# Patient Record
Sex: Male | Born: 1935 | Race: White | Hispanic: No | Marital: Single | Smoking: Former smoker
Health system: Southern US, Community
[De-identification: ages and names within clinical notes are randomized; demographics above are authoritative.]

## PROBLEM LIST (undated history)

## (undated) DIAGNOSIS — E785 Hyperlipidemia, unspecified: Secondary | ICD-10-CM

## (undated) DIAGNOSIS — I639 Cerebral infarction, unspecified: Secondary | ICD-10-CM

## (undated) DIAGNOSIS — C61 Malignant neoplasm of prostate: Secondary | ICD-10-CM

## (undated) DIAGNOSIS — N390 Urinary tract infection, site not specified: Secondary | ICD-10-CM

## (undated) DIAGNOSIS — I10 Essential (primary) hypertension: Secondary | ICD-10-CM

## (undated) DIAGNOSIS — B1910 Unspecified viral hepatitis B without hepatic coma: Secondary | ICD-10-CM

## (undated) DIAGNOSIS — H9190 Unspecified hearing loss, unspecified ear: Secondary | ICD-10-CM

## (undated) HISTORY — DX: Essential (primary) hypertension: I10

## (undated) HISTORY — DX: Hyperlipidemia, unspecified: E78.5

## (undated) HISTORY — PX: PROSTATE SURGERY: SHX751

## (undated) HISTORY — PX: BLADDER STONE REMOVAL: SHX568

## (undated) HISTORY — DX: Unspecified viral hepatitis B without hepatic coma: B19.10

## (undated) HISTORY — DX: Malignant neoplasm of prostate: C61

---

## 2001-08-23 ENCOUNTER — Inpatient Hospital Stay (HOSPITAL_COMMUNITY): Admission: EM | Admit: 2001-08-23 | Discharge: 2001-08-30 | Payer: Self-pay

## 2001-08-24 ENCOUNTER — Encounter: Payer: Self-pay | Admitting: Internal Medicine

## 2002-08-17 HISTORY — PX: CATARACT EXTRACTION W/ INTRAOCULAR LENS  IMPLANT, BILATERAL: SHX1307

## 2002-11-01 ENCOUNTER — Emergency Department (HOSPITAL_COMMUNITY): Admission: EM | Admit: 2002-11-01 | Discharge: 2002-11-01 | Payer: Self-pay | Admitting: Emergency Medicine

## 2004-04-28 ENCOUNTER — Ambulatory Visit (HOSPITAL_COMMUNITY): Admission: RE | Admit: 2004-04-28 | Discharge: 2004-04-28 | Payer: Self-pay | Admitting: Urology

## 2004-05-09 ENCOUNTER — Ambulatory Visit: Admission: RE | Admit: 2004-05-09 | Discharge: 2004-06-24 | Payer: Self-pay | Admitting: Radiation Oncology

## 2004-05-20 ENCOUNTER — Ambulatory Visit (HOSPITAL_COMMUNITY): Admission: RE | Admit: 2004-05-20 | Discharge: 2004-05-20 | Payer: Self-pay | Admitting: Radiation Oncology

## 2004-07-06 ENCOUNTER — Emergency Department (HOSPITAL_COMMUNITY): Admission: EM | Admit: 2004-07-06 | Discharge: 2004-07-06 | Payer: Self-pay | Admitting: Emergency Medicine

## 2005-01-24 ENCOUNTER — Encounter: Admission: RE | Admit: 2005-01-24 | Discharge: 2005-01-24 | Payer: Self-pay | Admitting: Internal Medicine

## 2011-10-20 DIAGNOSIS — C61 Malignant neoplasm of prostate: Secondary | ICD-10-CM | POA: Diagnosis not present

## 2011-12-07 DIAGNOSIS — E785 Hyperlipidemia, unspecified: Secondary | ICD-10-CM | POA: Diagnosis not present

## 2011-12-07 DIAGNOSIS — I10 Essential (primary) hypertension: Secondary | ICD-10-CM | POA: Diagnosis not present

## 2011-12-14 DIAGNOSIS — R82998 Other abnormal findings in urine: Secondary | ICD-10-CM | POA: Diagnosis not present

## 2011-12-14 DIAGNOSIS — E785 Hyperlipidemia, unspecified: Secondary | ICD-10-CM | POA: Diagnosis not present

## 2011-12-14 DIAGNOSIS — I10 Essential (primary) hypertension: Secondary | ICD-10-CM | POA: Diagnosis not present

## 2011-12-14 DIAGNOSIS — C61 Malignant neoplasm of prostate: Secondary | ICD-10-CM | POA: Diagnosis not present

## 2012-02-16 DIAGNOSIS — C3490 Malignant neoplasm of unspecified part of unspecified bronchus or lung: Secondary | ICD-10-CM | POA: Diagnosis not present

## 2012-02-16 DIAGNOSIS — C7800 Secondary malignant neoplasm of unspecified lung: Secondary | ICD-10-CM | POA: Diagnosis not present

## 2012-02-16 DIAGNOSIS — C781 Secondary malignant neoplasm of mediastinum: Secondary | ICD-10-CM | POA: Diagnosis not present

## 2012-05-24 DIAGNOSIS — R310 Gross hematuria: Secondary | ICD-10-CM | POA: Diagnosis not present

## 2012-05-24 DIAGNOSIS — C61 Malignant neoplasm of prostate: Secondary | ICD-10-CM | POA: Diagnosis not present

## 2012-05-24 DIAGNOSIS — N400 Enlarged prostate without lower urinary tract symptoms: Secondary | ICD-10-CM | POA: Diagnosis not present

## 2012-06-03 DIAGNOSIS — N210 Calculus in bladder: Secondary | ICD-10-CM | POA: Diagnosis not present

## 2012-06-03 DIAGNOSIS — R310 Gross hematuria: Secondary | ICD-10-CM | POA: Diagnosis not present

## 2012-06-03 DIAGNOSIS — C61 Malignant neoplasm of prostate: Secondary | ICD-10-CM | POA: Diagnosis not present

## 2012-06-16 DIAGNOSIS — N400 Enlarged prostate without lower urinary tract symptoms: Secondary | ICD-10-CM | POA: Diagnosis not present

## 2012-06-16 DIAGNOSIS — C61 Malignant neoplasm of prostate: Secondary | ICD-10-CM | POA: Diagnosis not present

## 2012-06-16 DIAGNOSIS — R310 Gross hematuria: Secondary | ICD-10-CM | POA: Diagnosis not present

## 2012-07-13 DIAGNOSIS — Z23 Encounter for immunization: Secondary | ICD-10-CM | POA: Diagnosis not present

## 2012-09-13 DIAGNOSIS — N138 Other obstructive and reflux uropathy: Secondary | ICD-10-CM | POA: Diagnosis not present

## 2012-09-13 DIAGNOSIS — E7800 Pure hypercholesterolemia, unspecified: Secondary | ICD-10-CM | POA: Diagnosis not present

## 2012-09-13 DIAGNOSIS — N400 Enlarged prostate without lower urinary tract symptoms: Secondary | ICD-10-CM | POA: Diagnosis not present

## 2012-09-13 DIAGNOSIS — N401 Enlarged prostate with lower urinary tract symptoms: Secondary | ICD-10-CM | POA: Diagnosis not present

## 2012-09-13 DIAGNOSIS — I10 Essential (primary) hypertension: Secondary | ICD-10-CM | POA: Diagnosis not present

## 2012-09-13 DIAGNOSIS — Z8546 Personal history of malignant neoplasm of prostate: Secondary | ICD-10-CM | POA: Diagnosis not present

## 2012-09-13 DIAGNOSIS — C61 Malignant neoplasm of prostate: Secondary | ICD-10-CM | POA: Diagnosis not present

## 2012-09-13 DIAGNOSIS — N210 Calculus in bladder: Secondary | ICD-10-CM | POA: Diagnosis not present

## 2012-10-04 DIAGNOSIS — I4949 Other premature depolarization: Secondary | ICD-10-CM | POA: Diagnosis not present

## 2012-10-04 DIAGNOSIS — I10 Essential (primary) hypertension: Secondary | ICD-10-CM | POA: Diagnosis not present

## 2012-10-04 DIAGNOSIS — N138 Other obstructive and reflux uropathy: Secondary | ICD-10-CM | POA: Diagnosis not present

## 2012-10-04 DIAGNOSIS — E7800 Pure hypercholesterolemia, unspecified: Secondary | ICD-10-CM | POA: Diagnosis not present

## 2012-10-04 DIAGNOSIS — N210 Calculus in bladder: Secondary | ICD-10-CM | POA: Diagnosis not present

## 2012-10-04 DIAGNOSIS — C61 Malignant neoplasm of prostate: Secondary | ICD-10-CM | POA: Diagnosis not present

## 2012-10-04 DIAGNOSIS — Z01810 Encounter for preprocedural cardiovascular examination: Secondary | ICD-10-CM | POA: Diagnosis not present

## 2012-10-04 DIAGNOSIS — N401 Enlarged prostate with lower urinary tract symptoms: Secondary | ICD-10-CM | POA: Diagnosis not present

## 2012-10-04 DIAGNOSIS — N320 Bladder-neck obstruction: Secondary | ICD-10-CM | POA: Diagnosis not present

## 2012-10-04 DIAGNOSIS — Z79899 Other long term (current) drug therapy: Secondary | ICD-10-CM | POA: Diagnosis not present

## 2012-10-04 DIAGNOSIS — Z01818 Encounter for other preprocedural examination: Secondary | ICD-10-CM | POA: Diagnosis not present

## 2012-10-04 DIAGNOSIS — Z7982 Long term (current) use of aspirin: Secondary | ICD-10-CM | POA: Diagnosis not present

## 2012-10-26 DIAGNOSIS — R319 Hematuria, unspecified: Secondary | ICD-10-CM | POA: Diagnosis not present

## 2012-10-26 DIAGNOSIS — N400 Enlarged prostate without lower urinary tract symptoms: Secondary | ICD-10-CM | POA: Diagnosis not present

## 2012-10-26 DIAGNOSIS — N210 Calculus in bladder: Secondary | ICD-10-CM | POA: Diagnosis not present

## 2012-10-26 DIAGNOSIS — Z79899 Other long term (current) drug therapy: Secondary | ICD-10-CM | POA: Diagnosis not present

## 2012-10-26 DIAGNOSIS — I10 Essential (primary) hypertension: Secondary | ICD-10-CM | POA: Diagnosis not present

## 2012-10-26 DIAGNOSIS — C61 Malignant neoplasm of prostate: Secondary | ICD-10-CM | POA: Diagnosis not present

## 2012-10-26 DIAGNOSIS — E785 Hyperlipidemia, unspecified: Secondary | ICD-10-CM | POA: Diagnosis not present

## 2012-10-26 DIAGNOSIS — N401 Enlarged prostate with lower urinary tract symptoms: Secondary | ICD-10-CM | POA: Diagnosis not present

## 2012-10-26 DIAGNOSIS — N209 Urinary calculus, unspecified: Secondary | ICD-10-CM | POA: Diagnosis not present

## 2012-10-27 DIAGNOSIS — N400 Enlarged prostate without lower urinary tract symptoms: Secondary | ICD-10-CM | POA: Diagnosis not present

## 2012-10-27 DIAGNOSIS — I10 Essential (primary) hypertension: Secondary | ICD-10-CM | POA: Diagnosis not present

## 2012-10-27 DIAGNOSIS — N401 Enlarged prostate with lower urinary tract symptoms: Secondary | ICD-10-CM | POA: Diagnosis not present

## 2012-10-27 DIAGNOSIS — N210 Calculus in bladder: Secondary | ICD-10-CM | POA: Diagnosis not present

## 2012-10-27 DIAGNOSIS — Z79899 Other long term (current) drug therapy: Secondary | ICD-10-CM | POA: Diagnosis not present

## 2012-10-27 DIAGNOSIS — C61 Malignant neoplasm of prostate: Secondary | ICD-10-CM | POA: Diagnosis not present

## 2012-10-27 DIAGNOSIS — E785 Hyperlipidemia, unspecified: Secondary | ICD-10-CM | POA: Diagnosis not present

## 2012-11-03 DIAGNOSIS — N400 Enlarged prostate without lower urinary tract symptoms: Secondary | ICD-10-CM | POA: Diagnosis not present

## 2012-11-23 DIAGNOSIS — N400 Enlarged prostate without lower urinary tract symptoms: Secondary | ICD-10-CM | POA: Diagnosis not present

## 2012-11-29 DIAGNOSIS — N209 Urinary calculus, unspecified: Secondary | ICD-10-CM | POA: Insufficient documentation

## 2012-11-29 DIAGNOSIS — N320 Bladder-neck obstruction: Secondary | ICD-10-CM | POA: Insufficient documentation

## 2012-11-29 DIAGNOSIS — C61 Malignant neoplasm of prostate: Secondary | ICD-10-CM | POA: Insufficient documentation

## 2012-11-30 DIAGNOSIS — R310 Gross hematuria: Secondary | ICD-10-CM | POA: Diagnosis not present

## 2012-11-30 DIAGNOSIS — R32 Unspecified urinary incontinence: Secondary | ICD-10-CM | POA: Diagnosis not present

## 2012-11-30 DIAGNOSIS — N320 Bladder-neck obstruction: Secondary | ICD-10-CM | POA: Diagnosis not present

## 2012-11-30 DIAGNOSIS — Z7982 Long term (current) use of aspirin: Secondary | ICD-10-CM | POA: Diagnosis not present

## 2012-11-30 DIAGNOSIS — Z87442 Personal history of urinary calculi: Secondary | ICD-10-CM | POA: Diagnosis not present

## 2012-11-30 DIAGNOSIS — N401 Enlarged prostate with lower urinary tract symptoms: Secondary | ICD-10-CM | POA: Diagnosis not present

## 2012-11-30 DIAGNOSIS — C61 Malignant neoplasm of prostate: Secondary | ICD-10-CM | POA: Diagnosis not present

## 2012-11-30 DIAGNOSIS — R351 Nocturia: Secondary | ICD-10-CM | POA: Diagnosis not present

## 2012-11-30 DIAGNOSIS — I10 Essential (primary) hypertension: Secondary | ICD-10-CM | POA: Diagnosis not present

## 2012-11-30 DIAGNOSIS — Z882 Allergy status to sulfonamides status: Secondary | ICD-10-CM | POA: Diagnosis not present

## 2012-11-30 DIAGNOSIS — E7800 Pure hypercholesterolemia, unspecified: Secondary | ICD-10-CM | POA: Diagnosis not present

## 2012-11-30 DIAGNOSIS — N210 Calculus in bladder: Secondary | ICD-10-CM | POA: Diagnosis not present

## 2012-11-30 DIAGNOSIS — R3915 Urgency of urination: Secondary | ICD-10-CM | POA: Diagnosis not present

## 2012-12-01 DIAGNOSIS — N401 Enlarged prostate with lower urinary tract symptoms: Secondary | ICD-10-CM | POA: Diagnosis not present

## 2012-12-01 DIAGNOSIS — R3915 Urgency of urination: Secondary | ICD-10-CM | POA: Diagnosis not present

## 2012-12-01 DIAGNOSIS — R351 Nocturia: Secondary | ICD-10-CM | POA: Diagnosis not present

## 2012-12-01 DIAGNOSIS — C61 Malignant neoplasm of prostate: Secondary | ICD-10-CM | POA: Diagnosis not present

## 2012-12-01 DIAGNOSIS — N320 Bladder-neck obstruction: Secondary | ICD-10-CM | POA: Diagnosis not present

## 2012-12-01 DIAGNOSIS — R32 Unspecified urinary incontinence: Secondary | ICD-10-CM | POA: Diagnosis not present

## 2012-12-02 DIAGNOSIS — N400 Enlarged prostate without lower urinary tract symptoms: Secondary | ICD-10-CM | POA: Diagnosis not present

## 2012-12-08 DIAGNOSIS — N400 Enlarged prostate without lower urinary tract symptoms: Secondary | ICD-10-CM | POA: Diagnosis not present

## 2012-12-12 DIAGNOSIS — N400 Enlarged prostate without lower urinary tract symptoms: Secondary | ICD-10-CM | POA: Diagnosis not present

## 2013-01-10 DIAGNOSIS — R351 Nocturia: Secondary | ICD-10-CM | POA: Diagnosis not present

## 2013-01-10 DIAGNOSIS — Z8546 Personal history of malignant neoplasm of prostate: Secondary | ICD-10-CM | POA: Diagnosis not present

## 2013-01-10 DIAGNOSIS — Z87442 Personal history of urinary calculi: Secondary | ICD-10-CM | POA: Diagnosis not present

## 2013-01-10 DIAGNOSIS — R3919 Other difficulties with micturition: Secondary | ICD-10-CM | POA: Diagnosis not present

## 2013-01-10 DIAGNOSIS — R3915 Urgency of urination: Secondary | ICD-10-CM | POA: Diagnosis not present

## 2013-01-10 DIAGNOSIS — Z48816 Encounter for surgical aftercare following surgery on the genitourinary system: Secondary | ICD-10-CM | POA: Diagnosis not present

## 2013-01-10 DIAGNOSIS — R350 Frequency of micturition: Secondary | ICD-10-CM | POA: Diagnosis not present

## 2013-02-02 DIAGNOSIS — C61 Malignant neoplasm of prostate: Secondary | ICD-10-CM | POA: Diagnosis not present

## 2013-02-09 DIAGNOSIS — C61 Malignant neoplasm of prostate: Secondary | ICD-10-CM | POA: Diagnosis not present

## 2013-02-09 DIAGNOSIS — R82998 Other abnormal findings in urine: Secondary | ICD-10-CM | POA: Diagnosis not present

## 2013-02-09 DIAGNOSIS — N400 Enlarged prostate without lower urinary tract symptoms: Secondary | ICD-10-CM | POA: Diagnosis not present

## 2013-05-03 DIAGNOSIS — Z125 Encounter for screening for malignant neoplasm of prostate: Secondary | ICD-10-CM | POA: Diagnosis not present

## 2013-05-03 DIAGNOSIS — E785 Hyperlipidemia, unspecified: Secondary | ICD-10-CM | POA: Diagnosis not present

## 2013-05-03 DIAGNOSIS — I10 Essential (primary) hypertension: Secondary | ICD-10-CM | POA: Diagnosis not present

## 2013-05-03 DIAGNOSIS — R82998 Other abnormal findings in urine: Secondary | ICD-10-CM | POA: Diagnosis not present

## 2013-05-08 DIAGNOSIS — Z0000 Encounter for general adult medical examination without abnormal findings: Secondary | ICD-10-CM | POA: Diagnosis not present

## 2013-05-08 DIAGNOSIS — C61 Malignant neoplasm of prostate: Secondary | ICD-10-CM | POA: Diagnosis not present

## 2013-05-08 DIAGNOSIS — Z1331 Encounter for screening for depression: Secondary | ICD-10-CM | POA: Diagnosis not present

## 2013-05-08 DIAGNOSIS — Z23 Encounter for immunization: Secondary | ICD-10-CM | POA: Diagnosis not present

## 2013-05-08 DIAGNOSIS — E785 Hyperlipidemia, unspecified: Secondary | ICD-10-CM | POA: Diagnosis not present

## 2013-05-08 DIAGNOSIS — I10 Essential (primary) hypertension: Secondary | ICD-10-CM | POA: Diagnosis not present

## 2013-05-10 DIAGNOSIS — Z1212 Encounter for screening for malignant neoplasm of rectum: Secondary | ICD-10-CM | POA: Diagnosis not present

## 2013-06-01 ENCOUNTER — Encounter: Payer: Self-pay | Admitting: Gastroenterology

## 2013-06-15 DIAGNOSIS — R300 Dysuria: Secondary | ICD-10-CM | POA: Diagnosis not present

## 2013-06-15 DIAGNOSIS — R82998 Other abnormal findings in urine: Secondary | ICD-10-CM | POA: Diagnosis not present

## 2013-07-04 ENCOUNTER — Encounter: Payer: Self-pay | Admitting: Gastroenterology

## 2013-07-04 ENCOUNTER — Ambulatory Visit (INDEPENDENT_AMBULATORY_CARE_PROVIDER_SITE_OTHER): Payer: Medicare Other | Admitting: Gastroenterology

## 2013-07-04 ENCOUNTER — Telehealth: Payer: Self-pay

## 2013-07-04 VITALS — BP 124/70 | HR 52 | Ht 66.75 in | Wt 210.2 lb

## 2013-07-04 DIAGNOSIS — R197 Diarrhea, unspecified: Secondary | ICD-10-CM

## 2013-07-04 DIAGNOSIS — R195 Other fecal abnormalities: Secondary | ICD-10-CM | POA: Diagnosis not present

## 2013-07-04 MED ORDER — PEG-KCL-NACL-NASULF-NA ASC-C 100 G PO SOLR: 1.0000 | Freq: Once | ORAL | Status: DC

## 2013-07-04 NOTE — Patient Instructions (Addendum)
You have been given a separate informational sheet regarding your tobacco use, the importance of quitting and local resources to help you quit.  You have been scheduled for a colonoscopy with propofol. Please follow written instructions given to you at your visit today.  Please pick up your prep kit at the pharmacy within the next 1-3 days. If you use inhalers (even only as needed), please bring them with you on the day of your procedure.  Thank you for choosing me and [facility].  Venita Lick. Pleas Koch., MD., Clementeen Graham  cc: Rodrigo Ran, MD

## 2013-07-04 NOTE — Telephone Encounter (Signed)
07/04/2013   RE: Hawthorne Day DOB: [birth year] MRN: [id number]   Dear Dr. Waynard Edwards,    We have scheduled the above patient for an endoscopic procedure. Our records show that he is on anticoagulation therapy.   Please advise as to how long the patient may come off his therapy of Plendil prior to the procedure, which is scheduled for 09/12/13.  Please fax back/ or route the completed form to [facility] at [phone].   Sincerely,    Christie Nottingham, CMA

## 2013-07-04 NOTE — Progress Notes (Signed)
    History of Present Illness: This is an 79 year old male who relates intermittent problems with mild diarrhea. Hemosure positive stool was recently noted on screening.  He started taking Align and has mild diarrhea has improved. He has not previously at colonoscopy. He relates tooth extractions and new dentures scheduled in late November/early December. Denies weight loss, abdominal pain, constipation, diarrhea, change in stool caliber, melena, hematochezia, nausea, vomiting, dysphagia, reflux symptoms, chest pain.  Review of Systems: Pertinent positive and negative review of systems were noted in the above HPI section. All other review of systems were otherwise negative.  Current Medications, Allergies, Past Medical History, Past Surgical History, Family History and Social History were reviewed in [employer] record.  Physical Exam: General: Well developed , well nourished, no acute distress Head: Normocephalic and atraumatic Eyes:  sclerae anicteric, EOMI Ears: Normal auditory acuity Mouth: No deformity or lesions Neck: Supple, no masses or thyromegaly Lungs: Clear throughout to auscultation Heart: Regular rate and rhythm; no murmurs, rubs or bruits Abdomen: Soft, non tender and non distended. No masses, hepatosplenomegaly or hernias noted. Normal Bowel sounds Rectal: Deferred to colonoscopy Musculoskeletal: Symmetrical with no gross deformities  Skin: No lesions on visible extremities Pulses:  Normal pulses noted Extremities: No clubbing, cyanosis, edema or deformities noted Neurological: Alert oriented x 4, grossly nonfocal Cervical Nodes:  No significant cervical adenopathy Inguinal Nodes: No significant inguinal adenopathy Psychological:  Alert and cooperative. Normal mood and affect  Assessment and Recommendations:  1.  Heme positive stool and intermittent diarrhea. Rule out colorectal neoplasms. Schedule colonoscopy following his dental extractions. The  risks, benefits, and alternatives to colonoscopy with possible biopsy and possible polypectomy were discussed with the patient and they consent to proceed.

## 2013-07-05 NOTE — Telephone Encounter (Signed)
Received fax from Dr. Waynard Edwards stating patient can hold Plendil 5 days before his procedure. Pt notified of Dr. Laurey Morale recommendations and patient verbalized understanding. Letter to be scanned into Epic.

## 2013-08-14 DIAGNOSIS — M999 Biomechanical lesion, unspecified: Secondary | ICD-10-CM | POA: Diagnosis not present

## 2013-08-14 DIAGNOSIS — M5137 Other intervertebral disc degeneration, lumbosacral region: Secondary | ICD-10-CM | POA: Diagnosis not present

## 2013-08-15 DIAGNOSIS — M999 Biomechanical lesion, unspecified: Secondary | ICD-10-CM | POA: Diagnosis not present

## 2013-08-15 DIAGNOSIS — M5137 Other intervertebral disc degeneration, lumbosacral region: Secondary | ICD-10-CM | POA: Diagnosis not present

## 2013-08-18 DIAGNOSIS — M5137 Other intervertebral disc degeneration, lumbosacral region: Secondary | ICD-10-CM | POA: Diagnosis not present

## 2013-08-18 DIAGNOSIS — M999 Biomechanical lesion, unspecified: Secondary | ICD-10-CM | POA: Diagnosis not present

## 2013-08-21 DIAGNOSIS — M5137 Other intervertebral disc degeneration, lumbosacral region: Secondary | ICD-10-CM | POA: Diagnosis not present

## 2013-08-21 DIAGNOSIS — M999 Biomechanical lesion, unspecified: Secondary | ICD-10-CM | POA: Diagnosis not present

## 2013-08-23 DIAGNOSIS — M5137 Other intervertebral disc degeneration, lumbosacral region: Secondary | ICD-10-CM | POA: Diagnosis not present

## 2013-08-23 DIAGNOSIS — M999 Biomechanical lesion, unspecified: Secondary | ICD-10-CM | POA: Diagnosis not present

## 2013-08-25 DIAGNOSIS — M5137 Other intervertebral disc degeneration, lumbosacral region: Secondary | ICD-10-CM | POA: Diagnosis not present

## 2013-08-25 DIAGNOSIS — M999 Biomechanical lesion, unspecified: Secondary | ICD-10-CM | POA: Diagnosis not present

## 2013-08-28 DIAGNOSIS — M5137 Other intervertebral disc degeneration, lumbosacral region: Secondary | ICD-10-CM | POA: Diagnosis not present

## 2013-08-28 DIAGNOSIS — M999 Biomechanical lesion, unspecified: Secondary | ICD-10-CM | POA: Diagnosis not present

## 2013-08-30 DIAGNOSIS — M5137 Other intervertebral disc degeneration, lumbosacral region: Secondary | ICD-10-CM | POA: Diagnosis not present

## 2013-08-30 DIAGNOSIS — M999 Biomechanical lesion, unspecified: Secondary | ICD-10-CM | POA: Diagnosis not present

## 2013-09-01 DIAGNOSIS — M999 Biomechanical lesion, unspecified: Secondary | ICD-10-CM | POA: Diagnosis not present

## 2013-09-01 DIAGNOSIS — M5137 Other intervertebral disc degeneration, lumbosacral region: Secondary | ICD-10-CM | POA: Diagnosis not present

## 2013-09-05 DIAGNOSIS — M545 Low back pain, unspecified: Secondary | ICD-10-CM | POA: Diagnosis not present

## 2013-09-05 DIAGNOSIS — Z6832 Body mass index (BMI) 32.0-32.9, adult: Secondary | ICD-10-CM | POA: Diagnosis not present

## 2013-09-05 DIAGNOSIS — R609 Edema, unspecified: Secondary | ICD-10-CM | POA: Diagnosis not present

## 2013-09-05 DIAGNOSIS — I10 Essential (primary) hypertension: Secondary | ICD-10-CM | POA: Diagnosis not present

## 2013-09-05 DIAGNOSIS — M25569 Pain in unspecified knee: Secondary | ICD-10-CM | POA: Diagnosis not present

## 2013-09-06 ENCOUNTER — Other Ambulatory Visit (HOSPITAL_COMMUNITY): Payer: Self-pay | Admitting: Internal Medicine

## 2013-09-06 ENCOUNTER — Ambulatory Visit (HOSPITAL_COMMUNITY)
Admission: RE | Admit: 2013-09-06 | Discharge: 2013-09-06 | Disposition: A | Discharge status: Home / Self Care | Payer: Medicare Other | Source: Ambulatory Visit | Attending: Vascular Surgery | Admitting: Vascular Surgery

## 2013-09-06 DIAGNOSIS — R609 Edema, unspecified: Secondary | ICD-10-CM | POA: Insufficient documentation

## 2013-09-07 DIAGNOSIS — IMO0002 Reserved for concepts with insufficient information to code with codable children: Secondary | ICD-10-CM | POA: Diagnosis not present

## 2013-09-07 DIAGNOSIS — S99929A Unspecified injury of unspecified foot, initial encounter: Secondary | ICD-10-CM | POA: Diagnosis not present

## 2013-09-08 DIAGNOSIS — M5137 Other intervertebral disc degeneration, lumbosacral region: Secondary | ICD-10-CM | POA: Diagnosis not present

## 2013-09-08 DIAGNOSIS — M999 Biomechanical lesion, unspecified: Secondary | ICD-10-CM | POA: Diagnosis not present

## 2013-09-11 ENCOUNTER — Telehealth: Payer: Self-pay | Admitting: Gastroenterology

## 2013-09-11 NOTE — Telephone Encounter (Signed)
Yes charge. He should have called earlier if he wanted to cancel.

## 2013-09-12 ENCOUNTER — Encounter: Payer: Medicare Other | Admitting: Gastroenterology

## 2013-09-22 DIAGNOSIS — M5137 Other intervertebral disc degeneration, lumbosacral region: Secondary | ICD-10-CM | POA: Diagnosis not present

## 2013-09-22 DIAGNOSIS — M999 Biomechanical lesion, unspecified: Secondary | ICD-10-CM | POA: Diagnosis not present

## 2013-10-06 DIAGNOSIS — M999 Biomechanical lesion, unspecified: Secondary | ICD-10-CM | POA: Diagnosis not present

## 2013-10-06 DIAGNOSIS — M5137 Other intervertebral disc degeneration, lumbosacral region: Secondary | ICD-10-CM | POA: Diagnosis not present

## 2013-10-19 DIAGNOSIS — M999 Biomechanical lesion, unspecified: Secondary | ICD-10-CM | POA: Diagnosis not present

## 2013-10-19 DIAGNOSIS — M5137 Other intervertebral disc degeneration, lumbosacral region: Secondary | ICD-10-CM | POA: Diagnosis not present

## 2013-11-09 DIAGNOSIS — M5137 Other intervertebral disc degeneration, lumbosacral region: Secondary | ICD-10-CM | POA: Diagnosis not present

## 2013-11-09 DIAGNOSIS — M999 Biomechanical lesion, unspecified: Secondary | ICD-10-CM | POA: Diagnosis not present

## 2013-11-17 NOTE — Telephone Encounter (Signed)
Unable to bill patient Barry Swanson Late Cancel Fee because he has Medicare Insurance/yf

## 2013-11-21 DIAGNOSIS — R350 Frequency of micturition: Secondary | ICD-10-CM | POA: Diagnosis not present

## 2013-11-21 DIAGNOSIS — N400 Enlarged prostate without lower urinary tract symptoms: Secondary | ICD-10-CM | POA: Diagnosis not present

## 2013-11-21 DIAGNOSIS — R351 Nocturia: Secondary | ICD-10-CM | POA: Diagnosis not present

## 2013-12-05 DIAGNOSIS — K1329 Other disturbances of oral epithelium, including tongue: Secondary | ICD-10-CM | POA: Diagnosis not present

## 2013-12-07 DIAGNOSIS — M999 Biomechanical lesion, unspecified: Secondary | ICD-10-CM | POA: Diagnosis not present

## 2013-12-07 DIAGNOSIS — M5137 Other intervertebral disc degeneration, lumbosacral region: Secondary | ICD-10-CM | POA: Diagnosis not present

## 2014-01-18 DIAGNOSIS — M5137 Other intervertebral disc degeneration, lumbosacral region: Secondary | ICD-10-CM | POA: Diagnosis not present

## 2014-01-18 DIAGNOSIS — M999 Biomechanical lesion, unspecified: Secondary | ICD-10-CM | POA: Diagnosis not present

## 2014-02-19 DIAGNOSIS — M5137 Other intervertebral disc degeneration, lumbosacral region: Secondary | ICD-10-CM | POA: Diagnosis not present

## 2014-02-19 DIAGNOSIS — M999 Biomechanical lesion, unspecified: Secondary | ICD-10-CM | POA: Diagnosis not present

## 2014-03-20 DIAGNOSIS — M5137 Other intervertebral disc degeneration, lumbosacral region: Secondary | ICD-10-CM | POA: Diagnosis not present

## 2014-03-20 DIAGNOSIS — M999 Biomechanical lesion, unspecified: Secondary | ICD-10-CM | POA: Diagnosis not present

## 2014-04-19 DIAGNOSIS — M5137 Other intervertebral disc degeneration, lumbosacral region: Secondary | ICD-10-CM | POA: Diagnosis not present

## 2014-04-19 DIAGNOSIS — M999 Biomechanical lesion, unspecified: Secondary | ICD-10-CM | POA: Diagnosis not present

## 2014-05-17 DIAGNOSIS — M9913 Subluxation complex (vertebral) of lumbar region: Secondary | ICD-10-CM | POA: Diagnosis not present

## 2014-05-17 DIAGNOSIS — M5136 Other intervertebral disc degeneration, lumbar region: Secondary | ICD-10-CM | POA: Diagnosis not present

## 2014-05-22 DIAGNOSIS — C61 Malignant neoplasm of prostate: Secondary | ICD-10-CM | POA: Diagnosis not present

## 2014-05-22 DIAGNOSIS — N400 Enlarged prostate without lower urinary tract symptoms: Secondary | ICD-10-CM | POA: Diagnosis not present

## 2014-05-22 DIAGNOSIS — R350 Frequency of micturition: Secondary | ICD-10-CM | POA: Diagnosis not present

## 2014-05-22 DIAGNOSIS — R351 Nocturia: Secondary | ICD-10-CM | POA: Diagnosis not present

## 2014-05-29 DIAGNOSIS — R351 Nocturia: Secondary | ICD-10-CM | POA: Diagnosis not present

## 2014-05-29 DIAGNOSIS — R350 Frequency of micturition: Secondary | ICD-10-CM | POA: Diagnosis not present

## 2014-05-29 DIAGNOSIS — C61 Malignant neoplasm of prostate: Secondary | ICD-10-CM | POA: Diagnosis not present

## 2014-05-29 DIAGNOSIS — Z23 Encounter for immunization: Secondary | ICD-10-CM | POA: Diagnosis not present

## 2014-06-19 DIAGNOSIS — M9915 Subluxation complex (vertebral) of pelvic region: Secondary | ICD-10-CM | POA: Diagnosis not present

## 2014-06-19 DIAGNOSIS — M5136 Other intervertebral disc degeneration, lumbar region: Secondary | ICD-10-CM | POA: Diagnosis not present

## 2014-06-19 DIAGNOSIS — M9913 Subluxation complex (vertebral) of lumbar region: Secondary | ICD-10-CM | POA: Diagnosis not present

## 2014-06-19 DIAGNOSIS — M9914 Subluxation complex (vertebral) of sacral region: Secondary | ICD-10-CM | POA: Diagnosis not present

## 2014-07-17 DIAGNOSIS — M9915 Subluxation complex (vertebral) of pelvic region: Secondary | ICD-10-CM | POA: Diagnosis not present

## 2014-07-17 DIAGNOSIS — M9914 Subluxation complex (vertebral) of sacral region: Secondary | ICD-10-CM | POA: Diagnosis not present

## 2014-07-17 DIAGNOSIS — M9913 Subluxation complex (vertebral) of lumbar region: Secondary | ICD-10-CM | POA: Diagnosis not present

## 2014-07-17 DIAGNOSIS — M5136 Other intervertebral disc degeneration, lumbar region: Secondary | ICD-10-CM | POA: Diagnosis not present

## 2014-08-15 DIAGNOSIS — M9915 Subluxation complex (vertebral) of pelvic region: Secondary | ICD-10-CM | POA: Diagnosis not present

## 2014-08-15 DIAGNOSIS — M5136 Other intervertebral disc degeneration, lumbar region: Secondary | ICD-10-CM | POA: Diagnosis not present

## 2014-08-15 DIAGNOSIS — M9914 Subluxation complex (vertebral) of sacral region: Secondary | ICD-10-CM | POA: Diagnosis not present

## 2014-08-15 DIAGNOSIS — M9913 Subluxation complex (vertebral) of lumbar region: Secondary | ICD-10-CM | POA: Diagnosis not present

## 2014-09-11 DIAGNOSIS — M859 Disorder of bone density and structure, unspecified: Secondary | ICD-10-CM | POA: Diagnosis not present

## 2014-09-11 DIAGNOSIS — M9915 Subluxation complex (vertebral) of pelvic region: Secondary | ICD-10-CM | POA: Diagnosis not present

## 2014-09-11 DIAGNOSIS — R7301 Impaired fasting glucose: Secondary | ICD-10-CM | POA: Diagnosis not present

## 2014-09-11 DIAGNOSIS — Z125 Encounter for screening for malignant neoplasm of prostate: Secondary | ICD-10-CM | POA: Diagnosis not present

## 2014-09-11 DIAGNOSIS — E785 Hyperlipidemia, unspecified: Secondary | ICD-10-CM | POA: Diagnosis not present

## 2014-09-11 DIAGNOSIS — R358 Other polyuria: Secondary | ICD-10-CM | POA: Diagnosis not present

## 2014-09-11 DIAGNOSIS — M9913 Subluxation complex (vertebral) of lumbar region: Secondary | ICD-10-CM | POA: Diagnosis not present

## 2014-09-11 DIAGNOSIS — I10 Essential (primary) hypertension: Secondary | ICD-10-CM | POA: Diagnosis not present

## 2014-09-11 DIAGNOSIS — Z0000 Encounter for general adult medical examination without abnormal findings: Secondary | ICD-10-CM | POA: Diagnosis not present

## 2014-09-11 DIAGNOSIS — M5136 Other intervertebral disc degeneration, lumbar region: Secondary | ICD-10-CM | POA: Diagnosis not present

## 2014-09-11 DIAGNOSIS — M9914 Subluxation complex (vertebral) of sacral region: Secondary | ICD-10-CM | POA: Diagnosis not present

## 2014-09-18 DIAGNOSIS — I10 Essential (primary) hypertension: Secondary | ICD-10-CM | POA: Diagnosis not present

## 2014-09-18 DIAGNOSIS — R312 Other microscopic hematuria: Secondary | ICD-10-CM | POA: Diagnosis not present

## 2014-09-18 DIAGNOSIS — M545 Low back pain: Secondary | ICD-10-CM | POA: Diagnosis not present

## 2014-09-18 DIAGNOSIS — E785 Hyperlipidemia, unspecified: Secondary | ICD-10-CM | POA: Diagnosis not present

## 2014-09-18 DIAGNOSIS — Z6832 Body mass index (BMI) 32.0-32.9, adult: Secondary | ICD-10-CM | POA: Diagnosis not present

## 2014-09-18 DIAGNOSIS — R8299 Other abnormal findings in urine: Secondary | ICD-10-CM | POA: Diagnosis not present

## 2014-09-18 DIAGNOSIS — M859 Disorder of bone density and structure, unspecified: Secondary | ICD-10-CM | POA: Diagnosis not present

## 2014-09-18 DIAGNOSIS — C61 Malignant neoplasm of prostate: Secondary | ICD-10-CM | POA: Diagnosis not present

## 2014-09-18 DIAGNOSIS — R7301 Impaired fasting glucose: Secondary | ICD-10-CM | POA: Diagnosis not present

## 2014-09-18 DIAGNOSIS — M25562 Pain in left knee: Secondary | ICD-10-CM | POA: Diagnosis not present

## 2014-09-18 DIAGNOSIS — Z0000 Encounter for general adult medical examination without abnormal findings: Secondary | ICD-10-CM | POA: Diagnosis not present

## 2014-09-20 DIAGNOSIS — Z1212 Encounter for screening for malignant neoplasm of rectum: Secondary | ICD-10-CM | POA: Diagnosis not present

## 2014-10-12 DIAGNOSIS — R312 Other microscopic hematuria: Secondary | ICD-10-CM | POA: Diagnosis not present

## 2014-11-15 DIAGNOSIS — C61 Malignant neoplasm of prostate: Secondary | ICD-10-CM | POA: Diagnosis not present

## 2014-11-15 DIAGNOSIS — N400 Enlarged prostate without lower urinary tract symptoms: Secondary | ICD-10-CM | POA: Diagnosis not present

## 2014-12-05 DIAGNOSIS — M5136 Other intervertebral disc degeneration, lumbar region: Secondary | ICD-10-CM | POA: Diagnosis not present

## 2014-12-05 DIAGNOSIS — M9913 Subluxation complex (vertebral) of lumbar region: Secondary | ICD-10-CM | POA: Diagnosis not present

## 2014-12-05 DIAGNOSIS — M9915 Subluxation complex (vertebral) of pelvic region: Secondary | ICD-10-CM | POA: Diagnosis not present

## 2014-12-05 DIAGNOSIS — M9914 Subluxation complex (vertebral) of sacral region: Secondary | ICD-10-CM | POA: Diagnosis not present

## 2015-01-10 DIAGNOSIS — R310 Gross hematuria: Secondary | ICD-10-CM | POA: Diagnosis not present

## 2015-01-10 DIAGNOSIS — C61 Malignant neoplasm of prostate: Secondary | ICD-10-CM | POA: Diagnosis not present

## 2015-02-04 DIAGNOSIS — Z79899 Other long term (current) drug therapy: Secondary | ICD-10-CM | POA: Diagnosis not present

## 2015-02-04 DIAGNOSIS — E538 Deficiency of other specified B group vitamins: Secondary | ICD-10-CM | POA: Diagnosis not present

## 2015-02-04 DIAGNOSIS — Z6831 Body mass index (BMI) 31.0-31.9, adult: Secondary | ICD-10-CM | POA: Diagnosis not present

## 2015-02-04 DIAGNOSIS — N390 Urinary tract infection, site not specified: Secondary | ICD-10-CM | POA: Diagnosis not present

## 2015-02-04 DIAGNOSIS — R8290 Unspecified abnormal findings in urine: Secondary | ICD-10-CM | POA: Diagnosis not present

## 2015-02-04 DIAGNOSIS — R410 Disorientation, unspecified: Secondary | ICD-10-CM | POA: Diagnosis not present

## 2015-02-04 DIAGNOSIS — F8089 Other developmental disorders of speech and language: Secondary | ICD-10-CM | POA: Diagnosis not present

## 2015-02-06 DIAGNOSIS — I639 Cerebral infarction, unspecified: Secondary | ICD-10-CM | POA: Diagnosis not present

## 2015-02-06 DIAGNOSIS — G9389 Other specified disorders of brain: Secondary | ICD-10-CM | POA: Diagnosis not present

## 2015-02-06 DIAGNOSIS — I10 Essential (primary) hypertension: Secondary | ICD-10-CM | POA: Diagnosis not present

## 2015-02-07 DIAGNOSIS — I639 Cerebral infarction, unspecified: Secondary | ICD-10-CM | POA: Diagnosis not present

## 2015-02-08 ENCOUNTER — Other Ambulatory Visit: Payer: Self-pay

## 2015-02-08 ENCOUNTER — Ambulatory Visit (HOSPITAL_COMMUNITY): Payer: Medicare Other | Attending: Cardiovascular Disease

## 2015-02-08 ENCOUNTER — Other Ambulatory Visit: Payer: Self-pay | Admitting: Internal Medicine

## 2015-02-08 DIAGNOSIS — I10 Essential (primary) hypertension: Secondary | ICD-10-CM

## 2015-02-08 DIAGNOSIS — I639 Cerebral infarction, unspecified: Secondary | ICD-10-CM | POA: Insufficient documentation

## 2015-02-11 ENCOUNTER — Emergency Department (HOSPITAL_COMMUNITY): Payer: Medicare Other

## 2015-02-11 ENCOUNTER — Inpatient Hospital Stay (HOSPITAL_COMMUNITY)
Admission: EM | Admit: 2015-02-11 | Discharge: 2015-02-13 | DRG: 065 | Disposition: A | Discharge status: Home / Self Care | Payer: Medicare Other | Attending: Internal Medicine | Admitting: Internal Medicine

## 2015-02-11 ENCOUNTER — Other Ambulatory Visit: Payer: Self-pay

## 2015-02-11 ENCOUNTER — Observation Stay (HOSPITAL_COMMUNITY): Payer: Medicare Other

## 2015-02-11 ENCOUNTER — Encounter (HOSPITAL_COMMUNITY): Payer: Self-pay | Admitting: Emergency Medicine

## 2015-02-11 DIAGNOSIS — N179 Acute kidney failure, unspecified: Secondary | ICD-10-CM | POA: Diagnosis not present

## 2015-02-11 DIAGNOSIS — G459 Transient cerebral ischemic attack, unspecified: Secondary | ICD-10-CM | POA: Diagnosis present

## 2015-02-11 DIAGNOSIS — I6789 Other cerebrovascular disease: Secondary | ICD-10-CM | POA: Diagnosis not present

## 2015-02-11 DIAGNOSIS — R410 Disorientation, unspecified: Secondary | ICD-10-CM | POA: Diagnosis not present

## 2015-02-11 DIAGNOSIS — Z7902 Long term (current) use of antithrombotics/antiplatelets: Secondary | ICD-10-CM

## 2015-02-11 DIAGNOSIS — F17220 Nicotine dependence, chewing tobacco, uncomplicated: Secondary | ICD-10-CM | POA: Diagnosis present

## 2015-02-11 DIAGNOSIS — I129 Hypertensive chronic kidney disease with stage 1 through stage 4 chronic kidney disease, or unspecified chronic kidney disease: Secondary | ICD-10-CM | POA: Diagnosis present

## 2015-02-11 DIAGNOSIS — F4489 Other dissociative and conversion disorders: Secondary | ICD-10-CM | POA: Diagnosis not present

## 2015-02-11 DIAGNOSIS — E669 Obesity, unspecified: Secondary | ICD-10-CM | POA: Diagnosis present

## 2015-02-11 DIAGNOSIS — Z6833 Body mass index (BMI) 33.0-33.9, adult: Secondary | ICD-10-CM

## 2015-02-11 DIAGNOSIS — I639 Cerebral infarction, unspecified: Secondary | ICD-10-CM | POA: Diagnosis not present

## 2015-02-11 DIAGNOSIS — N182 Chronic kidney disease, stage 2 (mild): Secondary | ICD-10-CM | POA: Diagnosis present

## 2015-02-11 DIAGNOSIS — G9340 Encephalopathy, unspecified: Secondary | ICD-10-CM

## 2015-02-11 DIAGNOSIS — E785 Hyperlipidemia, unspecified: Secondary | ICD-10-CM | POA: Diagnosis present

## 2015-02-11 DIAGNOSIS — F0390 Unspecified dementia without behavioral disturbance: Secondary | ICD-10-CM | POA: Diagnosis not present

## 2015-02-11 DIAGNOSIS — I638 Other cerebral infarction: Secondary | ICD-10-CM | POA: Diagnosis not present

## 2015-02-11 DIAGNOSIS — I739 Peripheral vascular disease, unspecified: Secondary | ICD-10-CM | POA: Diagnosis present

## 2015-02-11 DIAGNOSIS — Z8249 Family history of ischemic heart disease and other diseases of the circulatory system: Secondary | ICD-10-CM

## 2015-02-11 DIAGNOSIS — R4182 Altered mental status, unspecified: Secondary | ICD-10-CM | POA: Diagnosis not present

## 2015-02-11 DIAGNOSIS — I10 Essential (primary) hypertension: Secondary | ICD-10-CM | POA: Diagnosis present

## 2015-02-11 DIAGNOSIS — Z79899 Other long term (current) drug therapy: Secondary | ICD-10-CM

## 2015-02-11 DIAGNOSIS — Z8546 Personal history of malignant neoplasm of prostate: Secondary | ICD-10-CM

## 2015-02-11 DIAGNOSIS — I651 Occlusion and stenosis of basilar artery: Secondary | ICD-10-CM | POA: Diagnosis not present

## 2015-02-11 HISTORY — DX: Cerebral infarction, unspecified: I63.9

## 2015-02-11 LAB — COMPREHENSIVE METABOLIC PANEL
ALBUMIN: 3.5 g/dL (ref 3.5–5.0)
ALT: 16 U/L — ABNORMAL LOW (ref 17–63)
AST: 19 U/L (ref 15–41)
Alkaline Phosphatase: 66 U/L (ref 38–126)
Anion gap: 8 (ref 5–15)
BILIRUBIN TOTAL: 0.8 mg/dL (ref 0.3–1.2)
BUN: 13 mg/dL (ref 6–20)
CALCIUM: 8.9 mg/dL (ref 8.9–10.3)
CHLORIDE: 107 mmol/L (ref 101–111)
CO2: 25 mmol/L (ref 22–32)
CREATININE: 1.25 mg/dL — AB (ref 0.61–1.24)
GFR calc Af Amer: 59 mL/min — ABNORMAL LOW (ref >60)
GFR calc non Af Amer: 51 mL/min — ABNORMAL LOW (ref >60)
GLUCOSE: 108 mg/dL — AB (ref 65–99)
Potassium: 3.9 mmol/L (ref 3.5–5.1)
Sodium: 140 mmol/L (ref 135–145)
Total Protein: 5.8 g/dL — ABNORMAL LOW (ref 6.5–8.1)

## 2015-02-11 LAB — URINE MICROSCOPIC-ADD ON

## 2015-02-11 LAB — CBC
HCT: 48.2 % (ref 39.0–52.0)
HEMATOCRIT: 44.2 % (ref 39.0–52.0)
HEMOGLOBIN: 14.6 g/dL (ref 13.0–17.0)
Hemoglobin: 16.2 g/dL (ref 13.0–17.0)
MCH: 30.2 pg (ref 26.0–34.0)
MCH: 30.7 pg (ref 26.0–34.0)
MCHC: 33 g/dL (ref 30.0–36.0)
MCHC: 33.6 g/dL (ref 30.0–36.0)
MCV: 91.3 fL (ref 78.0–100.0)
MCV: 91.3 fL (ref 78.0–100.0)
PLATELETS: 200 10*3/uL (ref 150–400)
Platelets: 210 10*3/uL (ref 150–400)
RBC: 4.84 MIL/uL (ref 4.22–5.81)
RBC: 5.28 MIL/uL (ref 4.22–5.81)
RDW: 13.7 % (ref 11.5–15.5)
RDW: 13.5 % (ref 11.5–15.5)
WBC: 6 10*3/uL (ref 4.0–10.5)
WBC: 5.6 10*3/uL (ref 4.0–10.5)

## 2015-02-11 LAB — URINALYSIS, ROUTINE W REFLEX MICROSCOPIC
BILIRUBIN URINE: NEGATIVE
Glucose, UA: NEGATIVE mg/dL
Hgb urine dipstick: NEGATIVE
Ketones, ur: NEGATIVE mg/dL
Nitrite: NEGATIVE
PH: 6 (ref 5.0–8.0)
Protein, ur: NEGATIVE mg/dL
SPECIFIC GRAVITY, URINE: 1.013 (ref 1.005–1.030)
UROBILINOGEN UA: 1 mg/dL (ref 0.0–1.0)

## 2015-02-11 LAB — VITAMIN B12: Vitamin B-12: 338 pg/mL (ref 180–914)

## 2015-02-11 LAB — CREATININE, SERUM
Creatinine, Ser: 1.32 mg/dL — ABNORMAL HIGH (ref 0.61–1.24)
GFR calc Af Amer: 55 mL/min — ABNORMAL LOW (ref >60)
GFR calc non Af Amer: 48 mL/min — ABNORMAL LOW (ref >60)

## 2015-02-11 LAB — PROTIME-INR
INR: 1.09 (ref 0.00–1.49)
PROTHROMBIN TIME: 14.3 s (ref 11.6–15.2)

## 2015-02-11 LAB — GLUCOSE, CAPILLARY: Glucose-Capillary: 92 mg/dL (ref 65–99)

## 2015-02-11 LAB — TSH: TSH: 1.556 u[IU]/mL (ref 0.350–4.500)

## 2015-02-11 LAB — CBG MONITORING, ED: GLUCOSE-CAPILLARY: 106 mg/dL — AB (ref 65–99)

## 2015-02-11 MED ORDER — CLOPIDOGREL BISULFATE 75 MG PO TABS
75.0000 mg | ORAL_TABLET | Freq: Every day | ORAL | Status: DC
  Administered 2015-02-12 – 2015-02-13 (×2): 75 mg via ORAL
  Filled 2015-02-11 (×2): qty 1

## 2015-02-11 MED ORDER — TAMSULOSIN HCL 0.4 MG PO CAPS
0.4000 mg | ORAL_CAPSULE | Freq: Every day | ORAL | Status: DC
  Administered 2015-02-12 – 2015-02-13 (×2): 0.4 mg via ORAL
  Filled 2015-02-11 (×2): qty 1

## 2015-02-11 MED ORDER — IRBESARTAN 150 MG PO TABS
150.0000 mg | ORAL_TABLET | Freq: Every day | ORAL | Status: DC
  Administered 2015-02-12 – 2015-02-13 (×2): 150 mg via ORAL
  Filled 2015-02-11 (×2): qty 1

## 2015-02-11 MED ORDER — CLOPIDOGREL BISULFATE 75 MG PO TABS
75.0000 mg | ORAL_TABLET | Freq: Every day | ORAL | Status: DC
  Filled 2015-02-11: qty 1

## 2015-02-11 MED ORDER — FINASTERIDE 5 MG PO TABS
5.0000 mg | ORAL_TABLET | Freq: Every day | ORAL | Status: DC
  Administered 2015-02-12 – 2015-02-13 (×2): 5 mg via ORAL
  Filled 2015-02-11 (×2): qty 1

## 2015-02-11 MED ORDER — HEPARIN SODIUM (PORCINE) 5000 UNIT/ML IJ SOLN
5000.0000 [IU] | Freq: Three times a day (TID) | INTRAMUSCULAR | Status: DC
  Administered 2015-02-12 (×2): 5000 [IU] via SUBCUTANEOUS
  Filled 2015-02-11 (×6): qty 1

## 2015-02-11 MED ORDER — FELODIPINE ER 10 MG PO TB24
10.0000 mg | ORAL_TABLET | Freq: Every day | ORAL | Status: DC
  Administered 2015-02-12 – 2015-02-13 (×2): 10 mg via ORAL
  Filled 2015-02-11 (×2): qty 1

## 2015-02-11 MED ORDER — SIMVASTATIN 10 MG PO TABS
10.0000 mg | ORAL_TABLET | Freq: Every day | ORAL | Status: DC
  Administered 2015-02-12: 10 mg via ORAL
  Filled 2015-02-11 (×2): qty 1

## 2015-02-11 MED ORDER — OMEGA-3-ACID ETHYL ESTERS 1 G PO CAPS
1000.0000 mg | ORAL_CAPSULE | Freq: Every day | ORAL | Status: DC
  Administered 2015-02-12 – 2015-02-13 (×2): 1000 mg via ORAL
  Filled 2015-02-11 (×2): qty 1

## 2015-02-11 MED ORDER — STROKE: EARLY STAGES OF RECOVERY BOOK
Freq: Once | Status: DC
Start: 2015-02-11 — End: 2015-02-13
  Filled 2015-02-11: qty 1

## 2015-02-11 MED ORDER — ASPIRIN 325 MG PO TABS
325.0000 mg | ORAL_TABLET | Freq: Every day | ORAL | Status: DC
  Administered 2015-02-11: 325 mg via ORAL
  Filled 2015-02-11 (×2): qty 1

## 2015-02-11 MED ORDER — ACETAMINOPHEN 325 MG PO TABS: 650.0000 mg | ORAL_TABLET | ORAL | Status: DC | PRN

## 2015-02-11 MED ORDER — SODIUM CHLORIDE 0.9 % IV SOLN
INTRAVENOUS | Status: DC
  Administered 2015-02-11: 19:00:00 via INTRAVENOUS

## 2015-02-11 MED ORDER — TAMSULOSIN HCL 0.4 MG PO CAPS
0.4000 mg | ORAL_CAPSULE | Freq: Every day | ORAL | Status: DC
  Filled 2015-02-11: qty 1

## 2015-02-11 MED ORDER — NIACIN 500 MG PO TABS
500.0000 mg | ORAL_TABLET | Freq: Every day | ORAL | Status: DC
  Administered 2015-02-11 – 2015-02-12 (×2): 500 mg via ORAL
  Filled 2015-02-11 (×3): qty 1

## 2015-02-11 MED ORDER — ADULT MULTIVITAMIN W/MINERALS CH
1.0000 | ORAL_TABLET | Freq: Every day | ORAL | Status: DC
  Administered 2015-02-12 – 2015-02-13 (×2): 1 via ORAL
  Filled 2015-02-11 (×2): qty 1

## 2015-02-11 NOTE — Progress Notes (Addendum)
Barry Najjar, NP notified of MRI results.  Called back by  NP [phone] -Orders to be placed by NP.

## 2015-02-11 NOTE — H&P (Signed)
History and Physical        Hospital Admission Note Date: 02/11/2015  Patient name: Barry Swanson Medical record number: [id number] Date of birth: October 19, 1928 Age: 81 y.o. Gender: male  PCP: Jerlyn Ly, MD  Referring physician: Dr Betsey Holiday   Chief Complaint:  Confusion  HPI: Patient is a 81 year old male with hypertension, hyperlipidemia, recent TIA had outpatient workup with Dr. Joylene Draft. Per patient's wife, he had similar symptoms at that time. [name] reported that he was in his normal state of health until yesterday. This morning he woke up and appeared to be somewhat confused. Per patient's wife, he did not remember what he ate at breakfast, which TV channel he was watching. Patient feels better at the time of the examination. Otherwise he denied any numbness, tingling, blurred vision, headache, focal weakness. [name] reported that they were told by the PCP to bring him to the ER if he has any neurological symptoms.  CT head negative for acute stroke, UA negative for UTI   Review of Systems:  Constitutional: Denies fever, chills, diaphoresis, poor appetite and fatigue.  HEENT: Denies photophobia, eye pain, redness, hearing loss, ear pain, congestion, sore throat, rhinorrhea, sneezing, mouth sores, trouble swallowing, neck pain, neck stiffness and tinnitus.   Respiratory: Denies SOB, DOE, cough, chest tightness,  and wheezing.   Cardiovascular: Denies chest pain, palpitations and leg swelling.  Gastrointestinal: Denies nausea, vomiting, abdominal pain, diarrhea, constipation, blood in stool and abdominal distention.  Genitourinary: Denies dysuria, urgency, frequency, hematuria, flank pain and difficulty urinating.  Musculoskeletal: Denies myalgias, back pain, joint swelling, arthralgias and gait problem.  Skin: Denies pallor, rash and wound.  Neurological: Please  see history of present illness Hematological: Denies adenopathy. Easy bruising, personal or family bleeding history  Psychiatric/Behavioral: Denies suicidal ideation, mood changes, confusion, nervousness, sleep disturbance and agitation  Past Medical History: Past Medical History  Diagnosis Date  . Hypertension   . Prostate cancer 2008  . Hyperlipidemia   . Hepatitis B     Past Surgical History  Procedure Laterality Date  . Bladder stone removal      blasted  . Prostate surgery      trimmed prostate away from urethral tract  . Cataract extraction w/ intraocular lens  implant, bilateral  2004    Medications: Prior to Admission medications   Medication Sig Start Date End Date Taking? Authorizing Provider  alendronate (FOSAMAX) 70 MG tablet Take 70 mg by mouth once a week. Sunday 02/11/15  Yes Historical Provider, MD  clopidogrel (PLAVIX) 75 MG tablet Take 1 tablet by mouth daily. 02/06/15  Yes Historical Provider, MD  Coenzyme Q10 (COQ10 PO) Take 1 tablet by mouth daily.   Yes Historical Provider, MD  felodipine (PLENDIL) 10 MG 24 hr tablet Take 10 mg by mouth daily.   Yes Historical Provider, MD  finasteride (PROSCAR) 5 MG tablet Take 1 tablet by mouth daily. 01/30/15  Yes Historical Provider, MD  Fish Oil-Cholecalciferol (OMEGA-3 FISH OIL/VITAMIN D3) 1000-1000 MG-UNIT CAPS Take 1 tablet by mouth daily.   Yes Historical Provider, MD  GARLIC PO Take 1 tablet by mouth daily.   Yes Historical Provider, MD  Multiple Vitamin (MULTIVITAMIN) tablet  Take 1 tablet by mouth daily.   Yes Historical Provider, MD  niacin 500 MG tablet Take 500 mg by mouth at bedtime.   Yes Historical Provider, MD  Potassium Aminobenzoate 500 MG TABS Take 1 tablet by mouth daily.   Yes Historical Provider, MD  Probiotic Product (ALIGN) 4 MG CAPS Take 1 capsule by mouth daily.   Yes Historical Provider, MD  simvastatin (ZOCOR) 10 MG tablet Take 10 mg by mouth daily.   Yes Historical Provider, MD  tamsulosin  (FLOMAX) 0.4 MG CAPS capsule Take 1 capsule by mouth daily as needed. 12/16/14  Yes Historical Provider, MD  valsartan (DIOVAN) 160 MG tablet Take 160 mg by mouth daily as needed (Takes if BP too high).    Yes Historical Provider, MD    Allergies:  No Known Allergies  Social History:  reports that he quit smoking about 16 years ago. His smoking use included Cigarettes. His smokeless tobacco use includes Chew. He reports that he drinks alcohol. He reports that he does not use illicit drugs.  Family History: Family History  Problem Relation Age of Onset  . Heart disease Mother   . Heart disease Father   . Lung cancer Brother   . Parkinson's disease Sister   . Leukemia Brother     Physical Exam: Blood pressure 145/79, pulse 50, temperature 98.1 F (36.7 C), temperature source Oral, resp. rate 16, height '5\' 6"'$  (1.676 m), weight 95.255 kg (210 lb), SpO2 94 %. General: Alert, awake, oriented x 2, in no acute distress. HEENT: normocephalic, atraumatic, anicteric sclera, pink conjunctiva, pupils equal and reactive to light and accomodation, oropharynx clear Neck: supple, no masses or lymphadenopathy, no goiter, no bruits  Heart: Regular rate and rhythm, without murmurs, rubs or gallops. Lungs: Clear to auscultation bilaterally, no wheezing, rales or rhonchi. Abdomen: Soft, nontender, nondistended, positive bowel sounds, no masses. Extremities: No clubbing, cyanosis or edema with positive pedal pulses. Neuro: Grossly intact, no focal neurological deficits, strength 5/5 upper and lower extremities bilaterally Psych: alert and oriented x 2, normal mood and affect Skin: no rashes or lesions, warm and dry   LABS on Admission:  Basic Metabolic Panel:  Recent Labs Lab 02/11/15 1402  NA 140  K 3.9  CL 107  CO2 25  GLUCOSE 108*  BUN 13  CREATININE 1.25*  CALCIUM 8.9   Liver Function Tests:  Recent Labs Lab 02/11/15 1402  AST 19  ALT 16*  ALKPHOS 66  BILITOT 0.8  PROT 5.8*    ALBUMIN 3.5   No results for input(s): LIPASE, AMYLASE in the last 168 hours. No results for input(s): AMMONIA in the last 168 hours. CBC:  Recent Labs Lab 02/11/15 1402  WBC 6.0  HGB 14.6  HCT 44.2  MCV 91.3  PLT 210   Cardiac Enzymes: No results for input(s): CKTOTAL, CKMB, CKMBINDEX, TROPONINI in the last 168 hours. BNP: Invalid input(s): POCBNP CBG:  Recent Labs Lab 02/11/15 1400  GLUCAP 106*    Radiological Exams on Admission:  Ct Head Wo Contrast  02/11/2015   CLINICAL DATA:  TIA last week, normal when he awoke this morning but was unable to work the remote beginning at 0900 hours, confusion, history hypertension, hyperlipidemia, prostate cancer  EXAM: CT HEAD WITHOUT CONTRAST  TECHNIQUE: Contiguous axial images were obtained from the base of the skull through the vertex without intravenous contrast.  COMPARISON:  None  FINDINGS: Generalized atrophy.  Normal ventricular morphology.  No midline shift or mass effect.  Small  vessel chronic ischemic changes of deep cerebral white matter.  Tiny old lacunar infarct at posterior RIGHT basal ganglia.  No intracranial hemorrhage, mass lesion, or acute infarction.  No extra-axial fluid collections.  Visualized paranasal sinuses and mastoid air cells clear.  Bones unremarkable.  Atherosclerotic calcifications of internal carotid and vertebral arteries at skullbase.  IMPRESSION: Minimal small vessel chronic ischemic changes of deep cerebral white matter.  Tiny old lacunar infarct posterior RIGHT basal ganglia.  No acute intracranial abnormalities.   Electronically Signed   By: Lavonia Dana M.D.   On: 02/11/2015 15:14    *I have personally reviewed the images above*  EKG: Independently reviewed.Rate 55, normal sinus rhythm   Assessment/Plan Principal Problem:   Acute encephalopathy: Unclear etiology, recent TIA with outpatient workup done by PCP - obtain MRI/MRA of the brain, CT head negative - 2-D echo on 6/24 showed EF of  83-66%, grade 1 diastolic dysfunction - Obtain carotid Dopplers - Obtain lipid panel, hemoglobin A 1C, PTOT evaluation - UA is negative for UTI - obtain dementia workup, obtain TSH, B12, folate, RPR, patient's wife also specifically requested Lyme's PCR as he had a tick bite a few months ago -  continue Plavix, statin   Active Problems:   TIA (transient ischemic attack) - Continue Plavix, statin     Hypertension - Continue valsartan     Hyperlipidemia - Obtain lipid panel, continue niacin, statin  DVT prophylaxis:  heparin subcutaneous   CODE STATUS:   Family Communication: Admission, patients condition and plan of care including tests being ordered have been discussed with the patient and wife who indicates understanding and agree with the plan and Code Status  Disposition plan: Further plan will depend as patient's clinical course evolves and further radiologic and laboratory data become available.   Time Spent on Admission: 60 mins   RAI,RIPUDEEP M.D. [facility] 02/11/2015, 5:01 PM Pager: [phone]  If 7PM-7AM, please contact night-coverage [email or web address] Password [email or web address]

## 2015-02-11 NOTE — Progress Notes (Signed)
RN paged this NP with MRI results. This NP called neuro on call, Dr. Doy Mince and made her aware. Stroke workup is in progress. Pt is without any neuro deficits at this time. On Plavix, ASA, and a statin. Workup to continue.

## 2015-02-11 NOTE — ED Notes (Signed)
Report attempted 

## 2015-02-11 NOTE — ED Notes (Signed)
Pt cbg 101

## 2015-02-11 NOTE — ED Notes (Signed)
Dr. Pollina at bedside   

## 2015-02-11 NOTE — ED Notes (Signed)
EMS cbg wnl

## 2015-02-11 NOTE — ED Notes (Signed)
TIA last week, had some confusion but no other deficits this am per wife.  P[atient came in via [facility].  Wife reported to EMS that patient went to go get some cereal at 0900 this am and came back with cookies, was also having difficulty utilizing the remote.  No neuro deficits noted, patient is alert and oriented x 4 now.  VSS.  Grip strength equal, perrla, no droop noted. No slurred speech

## 2015-02-11 NOTE — Progress Notes (Signed)
Pt. Arrived to the unit via stretcher accompanied by wife. Pt. Is alert and oriented with no signs of distress noted. Pt. Vitals appear stable with no skin issues noted. Pt. Ambulated from wheelchair to the bed and tolerated well. Educated pt. On use of staff numbers, room telephone, bed alarm and call bell. Call light within reach. Orders released. Neuro's are intact. Placed on tele box #5. No further needs noted at this time.

## 2015-02-11 NOTE — ED Provider Notes (Signed)
CSN: [id number]     Arrival date & time 02/11/15  1313 History   First MD Initiated Contact with Patient 02/11/15 1316     Chief Complaint  Patient presents with  . Altered Mental Status     (Consider location/radiation/quality/duration/timing/severity/associated sxs/prior Treatment) HPI Comments: Patient comes to the ER for evaluation of mental status changes. Patient reportedly was confused when he awakened this morning. Symptoms were present immediately upon wakening. Wife reports that he had similar symptoms a week ago and was seen by his primary care physician, Dr. Joylene Draft. He had outpatient workup which revealed small stroke. Symptoms are identical today. Patient denies headache. No chest pain or shortness of breath.  Patient is a 81 y.o. male presenting with altered mental status.  Altered Mental Status Presenting symptoms: confusion     Past Medical History  Diagnosis Date  . Hypertension   . Prostate cancer 2008  . Hyperlipidemia   . Hepatitis B    Past Surgical History  Procedure Laterality Date  . Bladder stone removal      blasted  . Prostate surgery      trimmed prostate away from urethral tract  . Cataract extraction w/ intraocular lens  implant, bilateral  2004   Family History  Problem Relation Age of Onset  . Heart disease Mother   . Heart disease Father   . Lung cancer Brother   . Parkinson's disease Sister   . Leukemia Brother    History  Substance Use Topics  . Smoking status: Former Smoker    Types: Cigarettes    Quit date: 08/17/1998  . Smokeless tobacco: Current User    Types: Chew  . Alcohol Use: Yes     Comment: rarely-1-2 beers a year    Review of Systems  Psychiatric/Behavioral: Positive for confusion.  All other systems reviewed and are negative.     Allergies  Review of patient's allergies indicates no known allergies.  Home Medications   Prior to Admission medications   Medication Sig Start Date End Date Taking? Authorizing  Provider  alendronate (FOSAMAX) 70 MG tablet Take 70 mg by mouth once a week. Sunday 02/11/15  Yes Historical Provider, MD  clopidogrel (PLAVIX) 75 MG tablet Take 1 tablet by mouth daily. 02/06/15  Yes Historical Provider, MD  Coenzyme Q10 (COQ10 PO) Take 1 tablet by mouth daily.   Yes Historical Provider, MD  felodipine (PLENDIL) 10 MG 24 hr tablet Take 10 mg by mouth daily.   Yes Historical Provider, MD  finasteride (PROSCAR) 5 MG tablet Take 1 tablet by mouth daily. 01/30/15  Yes Historical Provider, MD  Fish Oil-Cholecalciferol (OMEGA-3 FISH OIL/VITAMIN D3) 1000-1000 MG-UNIT CAPS Take 1 tablet by mouth daily.   Yes Historical Provider, MD  GARLIC PO Take 1 tablet by mouth daily.   Yes Historical Provider, MD  Multiple Vitamin (MULTIVITAMIN) tablet Take 1 tablet by mouth daily.   Yes Historical Provider, MD  niacin 500 MG tablet Take 500 mg by mouth at bedtime.   Yes Historical Provider, MD  Potassium Aminobenzoate 500 MG TABS Take 1 tablet by mouth daily.   Yes Historical Provider, MD  Probiotic Product (ALIGN) 4 MG CAPS Take 1 capsule by mouth daily.   Yes Historical Provider, MD  simvastatin (ZOCOR) 10 MG tablet Take 10 mg by mouth daily.   Yes Historical Provider, MD  tamsulosin (FLOMAX) 0.4 MG CAPS capsule Take 1 capsule by mouth daily as needed. 12/16/14  Yes Historical Provider, MD  valsartan (DIOVAN) 160 MG  tablet Take 160 mg by mouth daily as needed (Takes if BP too high).    Yes Historical Provider, MD   BP 144/74 mmHg  Pulse 48  Temp(Src) 98.1 F (36.7 C) (Oral)  Resp 18  Ht '5\' 6"'$  (1.676 m)  Wt 210 lb (95.255 kg)  BMI 33.91 kg/m2  SpO2 94% Physical Exam  Constitutional: He appears well-developed and well-nourished. No distress.  HENT:  Head: Normocephalic and atraumatic.  Right Ear: Hearing normal.  Left Ear: Hearing normal.  Nose: Nose normal.  Mouth/Throat: Oropharynx is clear and moist and mucous membranes are normal.  Eyes: Conjunctivae and EOM are normal. Pupils are  equal, round, and reactive to light.  Neck: Normal range of motion. Neck supple.  Cardiovascular: Regular rhythm, S1 normal and S2 normal.  Exam reveals no gallop and no friction rub.   No murmur heard. Pulmonary/Chest: Effort normal and breath sounds normal. No respiratory distress. He exhibits no tenderness.  Abdominal: Soft. Normal appearance and bowel sounds are normal. There is no hepatosplenomegaly. There is no tenderness. There is no rebound, no guarding, no tenderness at McBurney's point and negative Murphy's sign. No hernia.  Musculoskeletal: Normal range of motion.  Neurological: He is alert. He has normal strength. He is disoriented (time, mild confusion to place). No cranial nerve deficit or sensory deficit. Coordination normal. GCS eye subscore is 4. GCS verbal subscore is 5. GCS motor subscore is 6.  Extraocular muscle movement: normal No visual field cut Pupils: equal and reactive both direct and consensual response is normal No nystagmus present    Sensory function is intact to light touch, pinprick Proprioception intact  Grip strength 5/5 symmetric in upper extremities No pronator drift Normal finger to nose bilaterally  Lower extremity strength 5/5 against gravity Normal heel to shin bilaterally  Gait: normal   Skin: Skin is warm, dry and intact. No rash noted. No cyanosis.  Psychiatric: He has a normal mood and affect. His speech is normal and behavior is normal. Thought content normal.  Nursing note and vitals reviewed.   ED Course  Procedures (including critical care time) Labs Review Labs Reviewed  COMPREHENSIVE METABOLIC PANEL - Abnormal; Notable for the following:    Glucose, Bld 108 (*)    Creatinine, Ser 1.25 (*)    Total Protein 5.8 (*)    ALT 16 (*)    GFR calc non Af Amer 51 (*)    GFR calc Af Amer 59 (*)    All other components within normal limits  URINALYSIS, ROUTINE W REFLEX MICROSCOPIC (NOT AT [facility]) - Abnormal; Notable for the following:     APPearance HAZY (*)    Leukocytes, UA SMALL (*)    All other components within normal limits  URINE MICROSCOPIC-ADD ON - Abnormal; Notable for the following:    Squamous Epithelial / LPF FEW (*)    Bacteria, UA FEW (*)    All other components within normal limits  CBG MONITORING, ED - Abnormal; Notable for the following:    Glucose-Capillary 106 (*)    All other components within normal limits  CBC  PROTIME-INR    Imaging Review Ct Head Wo Contrast  02/11/2015   CLINICAL DATA:  TIA last week, normal when he awoke this morning but was unable to work the remote beginning at 0900 hours, confusion, history hypertension, hyperlipidemia, prostate cancer  EXAM: CT HEAD WITHOUT CONTRAST  TECHNIQUE: Contiguous axial images were obtained from the base of the skull through the vertex without intravenous contrast.  COMPARISON:  None  FINDINGS: Generalized atrophy.  Normal ventricular morphology.  No midline shift or mass effect.  Small vessel chronic ischemic changes of deep cerebral white matter.  Tiny old lacunar infarct at posterior RIGHT basal ganglia.  No intracranial hemorrhage, mass lesion, or acute infarction.  No extra-axial fluid collections.  Visualized paranasal sinuses and mastoid air cells clear.  Bones unremarkable.  Atherosclerotic calcifications of internal carotid and vertebral arteries at skullbase.  IMPRESSION: Minimal small vessel chronic ischemic changes of deep cerebral white matter.  Tiny old lacunar infarct posterior RIGHT basal ganglia.  No acute intracranial abnormalities.   Electronically Signed   By: Lavonia Dana M.D.   On: 02/11/2015 15:14     EKG Interpretation   Date/Time:  Monday February 11 2015 13:13:39 EDT Ventricular Rate:  55 PR Interval:  209 QRS Duration: 104 QT Interval:  477 QTC Calculation: 456 R Axis:   -30 Text Interpretation:  Sinus rhythm Left axis deviation No significant  change since last tracing Confirmed by POLLINA  MD, [place] [phone]) on   02/11/2015 4:06:38 PM      MDM   Final diagnoses:  Confusion    Patient presents to the ER for evaluation of confusion. Patient awakened this morning with acute altered in mental status. No obvious cause has been identified. There is no obvious infection to cause delirium. Wife reports that he had a similar episode a week ago and had an MRI as an outpatient which showed small stroke. Symptoms today are identical. Patient has been observed for a period of time here in the ER is still confused, altered to time and mild confusion to place. Will therefore require hospitalization for further evaluation.    Orpah Greek, MD 02/11/15 [phone]

## 2015-02-12 ENCOUNTER — Encounter (HOSPITAL_COMMUNITY): Payer: Medicare Other

## 2015-02-12 ENCOUNTER — Encounter (HOSPITAL_COMMUNITY): Payer: Self-pay | Admitting: *Deleted

## 2015-02-12 DIAGNOSIS — I739 Peripheral vascular disease, unspecified: Secondary | ICD-10-CM | POA: Diagnosis present

## 2015-02-12 DIAGNOSIS — Z79899 Other long term (current) drug therapy: Secondary | ICD-10-CM | POA: Diagnosis not present

## 2015-02-12 DIAGNOSIS — I6330 Cerebral infarction due to thrombosis of unspecified cerebral artery: Secondary | ICD-10-CM | POA: Diagnosis not present

## 2015-02-12 DIAGNOSIS — Z8546 Personal history of malignant neoplasm of prostate: Secondary | ICD-10-CM | POA: Diagnosis not present

## 2015-02-12 DIAGNOSIS — N182 Chronic kidney disease, stage 2 (mild): Secondary | ICD-10-CM | POA: Diagnosis present

## 2015-02-12 DIAGNOSIS — E669 Obesity, unspecified: Secondary | ICD-10-CM | POA: Diagnosis present

## 2015-02-12 DIAGNOSIS — I129 Hypertensive chronic kidney disease with stage 1 through stage 4 chronic kidney disease, or unspecified chronic kidney disease: Secondary | ICD-10-CM | POA: Diagnosis present

## 2015-02-12 DIAGNOSIS — Z6833 Body mass index (BMI) 33.0-33.9, adult: Secondary | ICD-10-CM | POA: Diagnosis not present

## 2015-02-12 DIAGNOSIS — Z8249 Family history of ischemic heart disease and other diseases of the circulatory system: Secondary | ICD-10-CM | POA: Diagnosis not present

## 2015-02-12 DIAGNOSIS — F0390 Unspecified dementia without behavioral disturbance: Secondary | ICD-10-CM | POA: Diagnosis present

## 2015-02-12 DIAGNOSIS — G459 Transient cerebral ischemic attack, unspecified: Secondary | ICD-10-CM | POA: Diagnosis not present

## 2015-02-12 DIAGNOSIS — G9340 Encephalopathy, unspecified: Secondary | ICD-10-CM | POA: Diagnosis not present

## 2015-02-12 DIAGNOSIS — E785 Hyperlipidemia, unspecified: Secondary | ICD-10-CM | POA: Diagnosis present

## 2015-02-12 DIAGNOSIS — Z7902 Long term (current) use of antithrombotics/antiplatelets: Secondary | ICD-10-CM | POA: Diagnosis not present

## 2015-02-12 DIAGNOSIS — F17220 Nicotine dependence, chewing tobacco, uncomplicated: Secondary | ICD-10-CM | POA: Diagnosis present

## 2015-02-12 DIAGNOSIS — I10 Essential (primary) hypertension: Secondary | ICD-10-CM | POA: Diagnosis not present

## 2015-02-12 DIAGNOSIS — I639 Cerebral infarction, unspecified: Secondary | ICD-10-CM | POA: Diagnosis present

## 2015-02-12 DIAGNOSIS — R410 Disorientation, unspecified: Secondary | ICD-10-CM | POA: Diagnosis not present

## 2015-02-12 DIAGNOSIS — N179 Acute kidney failure, unspecified: Secondary | ICD-10-CM | POA: Diagnosis present

## 2015-02-12 LAB — RAPID URINE DRUG SCREEN, HOSP PERFORMED
Amphetamines: NOT DETECTED
Barbiturates: NOT DETECTED
Benzodiazepines: NOT DETECTED
Cocaine: NOT DETECTED
OPIATES: NOT DETECTED
Tetrahydrocannabinol: NOT DETECTED

## 2015-02-12 LAB — RPR: RPR Ser Ql: NONREACTIVE

## 2015-02-12 LAB — LIPID PANEL
Cholesterol: 133 mg/dL (ref 0–200)
HDL: 32 mg/dL — ABNORMAL LOW (ref >40)
LDL Cholesterol: 76 mg/dL (ref 0–99)
Total CHOL/HDL Ratio: 4.2 RATIO
Triglycerides: 127 mg/dL (ref <150)
VLDL: 25 mg/dL (ref 0–40)

## 2015-02-12 LAB — GLUCOSE, CAPILLARY
GLUCOSE-CAPILLARY: 112 mg/dL — AB (ref 65–99)
Glucose-Capillary: 95 mg/dL (ref 65–99)
Glucose-Capillary: 96 mg/dL (ref 65–99)
Glucose-Capillary: 113 mg/dL — ABNORMAL HIGH (ref 65–99)

## 2015-02-12 LAB — HIV ANTIBODY (ROUTINE TESTING W REFLEX): HIV Screen 4th Generation wRfx: NONREACTIVE

## 2015-02-12 LAB — FOLATE: Folate: 40.9 ng/mL (ref >5.9)

## 2015-02-12 NOTE — Progress Notes (Signed)
Patient ID: Barry Swanson, male   DOB: [birth year], 81 y.o.   MRN: [id number]  [facility] PROGRESS NOTE  Barry Swanson NWG:[id number] DOB: 02/03/1929 DOA: 02/11/2015 PCP: Jerlyn Ly, MD   Brief narrative:    81 year old male with HTN, recent TIA, presented to [facility] ED for evaluation of confusion that started night prior to admission. Per wife, pt went to bed last night and was in his usual state of health but woke up confused and was unable to recall what he ate for breakfast. There was no reported any specific focal neurological symptoms such as weakness or numbness, tingling, loss of sensation.   Initial work up in ED including CT head, negative for stroke. Neurology team consulted and [facility] asked to admit for stroke work up.   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 Deitra Mayo office. No need to repeat. Wife says, awaiting results - recent 2D ECHO last week EF 60% with grade I diastolic CHF, no source of embolus  - LDL 76, HgbA1c pending - wife refused to give pt aspirin, agrees to continuing Plavix  - no need for additional stroke work up per neurology team  - Ongoing aggressive stroke risk factor management  Acute encephalopathy - likely secondary to the above - resolved   Hypertension - SBP in 170's this AM - continue Irbesartan as per home medical regimen  - add hydralazine as needed   Hyperlipidemia - continue statin   Acute renal failure - pre renal in etiology - if Cr continues to trend up, consider holding ARB and change to another antihypertensive regimen in AM - repeat BMP in AM  Dementia - Age related vs secondary to recent stroke  Obesity  - Body mass index is 33.91 kg/(m^2).  DVT prophylaxis -  Heparin SQ   Code Status: Full.  Family Communication:  plan of care discussed with the patient  Disposition Plan: Home in AM  IV access:  Peripheral IV  Procedures and diagnostic studies:    Ct Head Wo Contrast 02/11/2015   Minimal small vessel chronic ischemic changes of deep cerebral white matter.  Tiny old lacunar infarct posterior RIGHT basal ganglia.  No acute intracranial abnormalities.    Mri Brain Without Contrast 02/11/2015  Acute/subacute infarct posterior limb left internal capsule.  Remote small infarct mid right coronal radiata.  Mild small vessel disease type changes  Opacification left mastoid cells without obstructing lesion the left eustachian tube detected.  MRA HEAD  No medium or large size vessel significant stenosis or occlusion.  No significant stenosis of the distal vertebral arteries. Fenestrated distal left vertebral artery  Mild narrowing mid aspect of the basilar artery.  Mild branch vessel irregularity.     Medical Consultants:  Neurology   Other Consultants:  PT/OT/SLP  IAnti-Infectives:   None  Faye Ramsay, MD  [facility] Pager [phone]  If 7PM-7AM, please contact night-coverage [email or web address] Password [facility] 02/12/2015, 6:48 PM   LOS: 0 days   HPI/Subjective: No events overnight.   Objective: Filed Vitals:   02/12/15 0739 02/12/15 0952 02/12/15 1424 02/12/15 1813  BP: 170/70 142/74 136/70 150/72  Pulse: 53 59 69 69  Temp: 97.6 F (36.4 C) 97.9 F (36.6 C) 98.2 F (36.8 C) 98.1 F (36.7  C)  TempSrc: Oral Oral Oral Oral  Resp: '18 18 18 18  '$ Height:      Weight:      SpO2: 96% 94% 96% 96%    Intake/Output Summary (Last 24 hours) at 02/12/15 1848 Last data filed at 02/12/15 1814  Gross per 24 hour  Intake 1611.25 ml  Output    650 ml  Net 961.25 ml    Exam:   General:  Pt is alert, follows commands appropriately, not in acute distress  Cardiovascular: Regular rate and rhythm, no rubs, no gallops  Respiratory: Clear to  auscultation bilaterally, no wheezing, no crackles, no rhonchi  Abdomen: Soft, non tender, non distended, bowel sounds present, no guarding  Extremities: No edema, pulses DP and PT palpable bilaterally  Neuro: somewhat slow to respond but able to follow commands appropriately, strength is 5/5 bilaterally in upper and lower extremities with sensation intact throughout, CN II - XII intact   Data Reviewed: Basic Metabolic Panel:  Recent Labs Lab 02/11/15 1402 02/11/15 2159  NA 140  --   K 3.9  --   CL 107  --   CO2 25  --   GLUCOSE 108*  --   BUN 13  --   CREATININE 1.25* 1.32*  CALCIUM 8.9  --    Liver Function Tests:  Recent Labs Lab 02/11/15 1402  AST 19  ALT 16*  ALKPHOS 66  BILITOT 0.8  PROT 5.8*  ALBUMIN 3.5   CBC:  Recent Labs Lab 02/11/15 1402 02/11/15 2159  WBC 6.0 5.6  HGB 14.6 16.2  HCT 44.2 48.2  MCV 91.3 91.3  PLT 210 200   CBG:  Recent Labs Lab 02/11/15 1400 02/11/15 2112 02/12/15 0806 02/12/15 1145 02/12/15 1703  GLUCAP 106* 92 95 96 112*    Scheduled Meds: .  stroke: mapping our early stages of recovery book   Does not apply Once  . clopidogrel  75 mg Oral Daily  . felodipine  10 mg Oral Daily  . finasteride  5 mg Oral Daily  . heparin  5,000 Units Subcutaneous 3 times per day  . irbesartan  150 mg Oral Daily  . multivitamin with minerals  1 tablet Oral Daily  . niacin  500 mg Oral QHS  . omega-3 acid ethyl esters  1,000 mg Oral Daily  . simvastatin  10 mg Oral q1800  . tamsulosin  0.4 mg Oral Daily   Continuous Infusions: . sodium chloride 75 mL/hr at 02/11/15 1900
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
## 2015-02-12 NOTE — Progress Notes (Signed)
STROKE TEAM PROGRESS NOTE   SUBJECTIVE (INTERVAL HISTORY) His wife is at the bedside.  Overall he feels his condition is stable. Per wife, pt had a MRI last week as an OP on Thurs and pt was told he had a stroke then. The MRI that shows a stroke now is liekly the same stroke. New confusion yesterday all day, improved but still with deficits.    OBJECTIVE Temp:  [97.5 F (36.4 C)-98.4 F (36.9 C)] 97.9 F (36.6 C) (06/28 0952) Pulse Rate:  [47-59] 59 (06/28 0952) Cardiac Rhythm:  [-] Sinus bradycardia (06/27 2043) Resp:  [15-22] 18 (06/28 0952) BP: (140-177)/(70-94) 142/74 mmHg (06/28 0952) SpO2:  [92 %-100 %] 94 % (06/28 0952) Weight:  [95.255 kg (210 lb)] 95.255 kg (210 lb) (06/27 1318)   Recent Labs Lab 02/11/15 1400 02/11/15 2112 02/12/15 0806  GLUCAP 106* 92 95    Recent Labs Lab 02/11/15 1402 02/11/15 2159  NA 140  --   K 3.9  --   CL 107  --   CO2 25  --   GLUCOSE 108*  --   BUN 13  --   CREATININE 1.25* 1.32*  CALCIUM 8.9  --     Recent Labs Lab 02/11/15 1402  AST 19  ALT 16*  ALKPHOS 66  BILITOT 0.8  PROT 5.8*  ALBUMIN 3.5    Recent Labs Lab 02/11/15 1402 02/11/15 2159  WBC 6.0 5.6  HGB 14.6 16.2  HCT 44.2 48.2  MCV 91.3 91.3  PLT 210 200   No results for input(s): CKTOTAL, CKMB, CKMBINDEX, TROPONINI in the last 168 hours.  Recent Labs  02/11/15 1402  LABPROT 14.3  INR 1.09    Recent Labs  02/11/15 1501  COLORURINE YELLOW  LABSPEC 1.013  PHURINE 6.0  GLUCOSEU NEGATIVE  HGBUR NEGATIVE  BILIRUBINUR NEGATIVE  KETONESUR NEGATIVE  PROTEINUR NEGATIVE  UROBILINOGEN 1.0  NITRITE NEGATIVE  LEUKOCYTESUR SMALL*       Component Value Date/Time   CHOL 133 02/12/2015 0635   TRIG 127 02/12/2015 0635   HDL 32* 02/12/2015 0635   CHOLHDL 4.2 02/12/2015 0635   VLDL 25 02/12/2015 0635   LDLCALC 76 02/12/2015 0635   No results found for: HGBA1C    Component Value Date/Time   LABOPIA NONE DETECTED 02/11/2015 1500   COCAINSCRNUR NONE  DETECTED 02/11/2015 1500   LABBENZ NONE DETECTED 02/11/2015 1500   AMPHETMU NONE DETECTED 02/11/2015 1500   THCU NONE DETECTED 02/11/2015 1500   LABBARB NONE DETECTED 02/11/2015 1500    No results for input(s): ETH in the last 168 hours.  Ct Head Wo Contrast 02/11/2015    Minimal small vessel chronic ischemic changes of deep cerebral white matter.  Tiny old lacunar infarct posterior RIGHT basal ganglia.  No acute intracranial abnormalities.     MRI HEAD   02/11/2015    Acute/subacute infarct posterior limb left internal capsule.  Remote small infarct mid right coronal radiata.  Mild small vessel disease type changes  Opacification left mastoid cells without obstructing lesion the left eustachian tube detected.   02/05/2014 (outpatient, prior to admission)  1.Subacute infarction in the left basal ganglia.  2.Mild leukoaraiosis, most likely due to chronic microvascular disease.  3.Left mastoid effusion and possible left middle ear effusion.  MRA HEAD   02/11/2015    No medium or large size vessel significant stenosis or occlusion.  No significant stenosis of the distal vertebral arteries. Fenestrated distal left vertebral artery  Mild narrowing mid aspect of the  basilar artery.  Mild branch vessel irregularity.     2D Echocardiogram   - Left ventricle: The cavity size was normal. Wall thickness wasincreased in a pattern of mild LVH. Systolic function was normal.The estimated ejection fraction was in the range of 60% to 65%.Wall motion was normal; there were no regional wall motionabnormalities. Doppler parameters are consistent with abnormalleft ventricular relaxation (grade 1 diastolic dysfunction).   PHYSICAL EXAM Pleasant elderly caucasian male not in distress. . Afebrile. Head is nontraumatic. Neck is supple without bruit.    Cardiac exam no murmur or gallop. Lungs are clear to auscultation. Distal pulses are well felt. Neurological Exam :  Awake alert oriented x 3. Diminished  attention, registration and recall. Follows 2 step commands. ;  . Normal speech and language.eye movements full without nystagmus.fundi were not visualized. Vision acuity and fields appear normal. Hearing is normal. Palatal movements are normal. Face symmetric. Tongue midline. Normal strength, tone, reflexes and coordination. Normal sensation. Gait deferred. ASSESSMENT/PLAN [name] is a 81 y.o. male with history of hx stroke last week (OP MRI positive for stroke) presenting with new onset confusion. He did not receive IV t-PA due to delay in arrival.   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 Deitra Mayo office. No need to repeat. Wife awaiting results. If L ICA > 70% it could be related to current stroke, recommend follow up.  2D Echo  No source of embolus   LDL 76  HgbA1c pending  Heparin 5000 units sq tid for VTE prophylaxis  Diet Heart Room service appropriate?: Yes; Fluid consistency:: Thin  clopidogrel 75 mg orally every day prior to admission, now on aspirin 325 mg orally every day and clopidogrel 75 mg orally every day. No indication to add aspirin. Will continue plavix alone, this is not a plavix failure.  Patient counseled to  be compliant with his antithrombotic medications  No need for additional stroke work up  Ongoing aggressive stroke risk factor management  Stay hydrated  Therapy recommendations:  OP PT  Disposition:  Home with OP therapies  Ok for discharge from stroke standpoint  Follow up Dr. Leonie Man in 2 months. Order written  Hypertension  Stable  Hyperlipidemia  Home meds:  zocor 10, resumed in hospital  LDL 76, goal < 70  Continue statin at discharge  Other Stroke Risk  Factors  Advanced age  Former Cigarette smoker, quit smoking  years ago ETOH use  Obesity, Body mass index is 33.91 kg/(m^2).   Dementia  Age related vs secondary to recent stroke  Hospital day #   [place] Stafford for Pager information 02/12/2015 1:56 PM  I have personally examined this patient, reviewed notes, independently viewed imaging studies, participated in medical decision making and plan of care. I have made any additions or clarifications directly to the above note. Agree with note above. He presented with intermittent transient confusion from subacute basal ganglia infarct and is at risk for recurrent strokes.TIAs, neurologic worsening and needs ongoing stroke eval and aggressive risk factor modification.Will try to get records from [place] about recent work up. Antony Contras, MD Medical Director [place] Pager: [phone] 02/12/2015 8:33  PM    To contact Stroke Continuity provider, please refer to [email or web address] After hours, contact General Neurology

## 2015-02-12 NOTE — Consult Note (Signed)
Referring Physician: Rai    Chief Complaint: Confusion  HPI: Barry Swanson is an 81 y.o. male who awakened on yesterday confused.  Per patient's wife, he did not remember what he ate at breakfast, which TV channel he was watching.  This is now improved.  Patient went to bed the night before and was normal at that time.  Per patient about a week earlier he had a similar event.  His PCP was concerned about TIA versus stroke.    Date last known well: Date: 02/10/2015 Time last known well: Time: 20:00 tPA Given: No: Outside time window, resolution of symptoms  Past Medical History  Diagnosis Date  . Hypertension   . Prostate cancer 2008  . Hyperlipidemia   . Hepatitis B   . Stroke     Past Surgical History  Procedure Laterality Date  . Bladder stone removal      blasted  . Prostate surgery      trimmed prostate away from urethral tract  . Cataract extraction w/ intraocular lens  implant, bilateral  2004    Family History  Problem Relation Age of Onset  . Heart disease Mother   . Heart disease Father   . Lung cancer Brother   . Parkinson's disease Sister   . Leukemia Brother    Social History:  reports that he quit smoking about 16 years ago. His smoking use included Cigarettes. His smokeless tobacco use includes Chew. He reports that he drinks alcohol. He reports that he does not use illicit drugs.  Allergies: No Known Allergies  Medications:  I have reviewed the patient's current medications. Prior to Admission:  Prescriptions prior to admission  Medication Sig Dispense Refill Last Dose  . alendronate (FOSAMAX) 70 MG tablet Take 70 mg by mouth once a week. Sunday   02/10/2015 at Unknown time  . clopidogrel (PLAVIX) 75 MG tablet Take 1 tablet by mouth daily.   02/11/2015 at Unknown time  . Coenzyme Q10 (COQ10 PO) Take 1 tablet by mouth daily.   02/11/2015 at Unknown time  . felodipine (PLENDIL) 10 MG 24 hr tablet Take 10 mg by mouth daily.   02/11/2015 at Unknown time  .  finasteride (PROSCAR) 5 MG tablet Take 1 tablet by mouth daily.   02/11/2015 at Unknown time  . Fish Oil-Cholecalciferol (OMEGA-3 FISH OIL/VITAMIN D3) 1000-1000 MG-UNIT CAPS Take 1 tablet by mouth daily.   02/10/2015 at Unknown time  . GARLIC PO Take 1 tablet by mouth daily.   02/11/2015 at Unknown time  . Multiple Vitamin (MULTIVITAMIN) tablet Take 1 tablet by mouth daily.   02/11/2015 at Unknown time  . niacin 500 MG tablet Take 500 mg by mouth at bedtime.   02/10/2015 at Unknown time  . Potassium Aminobenzoate 500 MG TABS Take 1 tablet by mouth daily.   02/11/2015 at Unknown time  . Probiotic Product (ALIGN) 4 MG CAPS Take 1 capsule by mouth daily.   02/10/2015 at Unknown time  . simvastatin (ZOCOR) 10 MG tablet Take 10 mg by mouth daily.   02/10/2015 at Unknown time  . tamsulosin (FLOMAX) 0.4 MG CAPS capsule Take 1 capsule by mouth daily as needed.   unkn  . valsartan (DIOVAN) 160 MG tablet Take 160 mg by mouth daily as needed (Takes if BP too high).    Past Week at Unknown time   Scheduled: .  stroke: mapping our early stages of recovery book   Does not apply Once  . aspirin  325 mg Oral  Daily  . clopidogrel  75 mg Oral Daily  . felodipine  10 mg Oral Daily  . finasteride  5 mg Oral Daily  . heparin  5,000 Units Subcutaneous 3 times per day  . irbesartan  150 mg Oral Daily  . multivitamin with minerals  1 tablet Oral Daily  . niacin  500 mg Oral QHS  . omega-3 acid ethyl esters  1,000 mg Oral Daily  . simvastatin  10 mg Oral q1800  . tamsulosin  0.4 mg Oral Daily    ROS: History obtained from the patient  General ROS: negative for - chills, fatigue, fever, night sweats, weight gain or weight loss Psychological ROS: negative for - behavioral disorder, hallucinations, memory difficulties, mood swings or suicidal ideation Ophthalmic ROS: negative for - blurry vision, double vision, eye pain or loss of vision ENT ROS: negative for - epistaxis, nasal discharge, oral lesions, sore throat,  tinnitus or vertigo Allergy and Immunology ROS: negative for - hives or itchy/watery eyes Hematological and Lymphatic ROS: negative for - bleeding problems, bruising or swollen lymph nodes Endocrine ROS: negative for - galactorrhea, hair pattern changes, polydipsia/polyuria or temperature intolerance Respiratory ROS: negative for - cough, hemoptysis, shortness of breath or wheezing Cardiovascular ROS: negative for - chest pain, dyspnea on exertion, edema or irregular heartbeat Gastrointestinal ROS: negative for - abdominal pain, diarrhea, hematemesis, nausea/vomiting or stool incontinence Genito-Urinary ROS: negative for - dysuria, hematuria, incontinence or urinary frequency/urgency Musculoskeletal ROS: negative for - joint swelling or muscular weakness Neurological ROS: as noted in HPI Dermatological ROS: negative for rash and skin lesion changes  Physical Examination: Blood pressure 150/82, pulse 57, temperature 97.7 F (36.5 C), temperature source Oral, resp. rate 20, height '5\' 6"'$  (1.676 m), weight 95.255 kg (210 lb), SpO2 96 %.  HEENT-  Normocephalic, no lesions, without obvious abnormality.  Normal external eye and conjunctiva.  Normal TM's bilaterally.  Normal auditory canals and external ears. Normal external nose, mucus membranes and septum.  Normal pharynx. Cardiovascular- S1, S2 normal, pulses palpable throughout   Lungs- chest clear, no wheezing, rales, normal symmetric air entry Abdomen- soft, non-tender; bowel sounds normal; no masses,  no organomegaly Extremities- no edema Lymph-no adenopathy palpable Musculoskeletal-no joint tenderness, deformity or swelling Skin-warm and dry, no hyperpigmentation, vitiligo, or suspicious lesions  Neurological Examination Mental Status: Sleeping but easily awakened.  Oriented to place and year.  Unable to tell me the month.  Speech fluent without evidence of aphasia.  Able to follow 3 step commands without difficulty. Cranial Nerves: II:  Discs flat bilaterally; Visual fields grossly normal, pupils equal, round, reactive to light and accommodation III,IV, VI: left ptosis, extra-ocular motions intact bilaterally V,VII: mild left facial droop, facial light touch sensation normal bilaterally VIII: hearing normal bilaterally IX,X: gag reflex present XI: bilateral shoulder shrug XII: midline tongue extension Motor: Right : Upper extremity   5/5    Left:     Upper extremity   5/5  Lower extremity   5/5     Lower extremity   5/5 Tone and bulk:normal tone throughout; no atrophy noted Sensory: Pinprick and light touch intact throughout, bilaterally Deep Tendon Reflexes: 2+ in the upper extremities and absent in the lower extremities.   Plantars: Right: downgoing   Left: downgoing Cerebellar: normal finger-to-nose and normal heel-to-shin testing bilaterally   Laboratory Studies:  Basic Metabolic Panel:  Recent Labs Lab 02/11/15 1402 02/11/15 2159  NA 140  --   K 3.9  --   CL 107  --  CO2 25  --   GLUCOSE 108*  --   BUN 13  --   CREATININE 1.25* 1.32*  CALCIUM 8.9  --     Liver Function Tests:  Recent Labs Lab 02/11/15 1402  AST 19  ALT 16*  ALKPHOS 66  BILITOT 0.8  PROT 5.8*  ALBUMIN 3.5   No results for input(s): LIPASE, AMYLASE in the last 168 hours. No results for input(s): AMMONIA in the last 168 hours.  CBC:  Recent Labs Lab 02/11/15 1402 02/11/15 2159  WBC 6.0 5.6  HGB 14.6 16.2  HCT 44.2 48.2  MCV 91.3 91.3  PLT 210 200    Cardiac Enzymes: No results for input(s): CKTOTAL, CKMB, CKMBINDEX, TROPONINI in the last 168 hours.  BNP: Invalid input(s): POCBNP  CBG:  Recent Labs Lab 02/11/15 1400 02/11/15 2112  GLUCAP 106* 92    Microbiology: No results found for this or any previous visit.  Coagulation Studies:  Recent Labs  02/11/15 1402  LABPROT 14.3  INR 1.09    Urinalysis:  Recent Labs Lab 02/11/15 1501  COLORURINE YELLOW  LABSPEC 1.013  PHURINE 6.0  GLUCOSEU  NEGATIVE  HGBUR NEGATIVE  BILIRUBINUR NEGATIVE  KETONESUR NEGATIVE  PROTEINUR NEGATIVE  UROBILINOGEN 1.0  NITRITE NEGATIVE  LEUKOCYTESUR SMALL*    Lipid Panel: No results found for: CHOL, TRIG, HDL, CHOLHDL, VLDL, LDLCALC  HgbA1C: No results found for: HGBA1C  Urine Drug Screen:     Component Value Date/Time   LABOPIA NONE DETECTED 02/11/2015 1500   COCAINSCRNUR NONE DETECTED 02/11/2015 1500   LABBENZ NONE DETECTED 02/11/2015 1500   AMPHETMU NONE DETECTED 02/11/2015 1500   THCU NONE DETECTED 02/11/2015 1500   LABBARB NONE DETECTED 02/11/2015 1500    Alcohol Level: No results for input(s): ETH in the last 168 hours.  Other results: EKG: sinus rhythm at 55 bpm.  Imaging: Ct Head Wo Contrast  02/11/2015   CLINICAL DATA:  TIA last week, normal when he awoke this morning but was unable to work the remote beginning at 0900 hours, confusion, history hypertension, hyperlipidemia, prostate cancer  EXAM: CT HEAD WITHOUT CONTRAST  TECHNIQUE: Contiguous axial images were obtained from the base of the skull through the vertex without intravenous contrast.  COMPARISON:  None  FINDINGS: Generalized atrophy.  Normal ventricular morphology.  No midline shift or mass effect.  Small vessel chronic ischemic changes of deep cerebral white matter.  Tiny old lacunar infarct at posterior RIGHT basal ganglia.  No intracranial hemorrhage, mass lesion, or acute infarction.  No extra-axial fluid collections.  Visualized paranasal sinuses and mastoid air cells clear.  Bones unremarkable.  Atherosclerotic calcifications of internal carotid and vertebral arteries at skullbase.  IMPRESSION: Minimal small vessel chronic ischemic changes of deep cerebral white matter.  Tiny old lacunar infarct posterior RIGHT basal ganglia.  No acute intracranial abnormalities.   Electronically Signed   By: Lavonia Dana M.D.   On: 02/11/2015 15:14   Mri Brain Without Contrast  02/11/2015   CLINICAL DATA:  81 year old hypertensive  male with hyperlipidemia and history of prostate cancer presenting with confusion. Subsequent encounter.  EXAM: MRI HEAD WITHOUT CONTRAST  MRA HEAD WITHOUT CONTRAST  TECHNIQUE: Multiplanar, multiecho pulse sequences of the brain and surrounding structures were obtained without intravenous contrast. Angiographic images of the head were obtained using MRA technique without contrast.  COMPARISON:  02/11/2015 head CT.  FINDINGS: MRI HEAD FINDINGS  Acute/subacute infarct posterior limb left internal capsule.  Remote small infarct mid right coronal radiata.  Mild  small vessel disease type changes  No intracranial mass lesion noted on this unenhanced exam.  No intracranial hemorrhage.  Global atrophy without hydrocephalus.  Opacification left mastoid cells without obstructing lesion the left eustachian tube detected.  Major intracranial vascular structures are patent.  Post lens replacement otherwise orbital structures  Cervical medullary junction, pituitary region pineal region within limits  MRA HEAD FINDINGS  Anterior circulation without medium or large size vessel significant stenosis or occlusion.  No significant stenosis of the distal vertebral arteries. Fenestrated distal left vertebral artery without associated aneurysm.  Mild narrowing mid aspect of the basilar artery.  Nonvisualized anterior inferior cerebellar arteries.  Mild branch vessel irregularity.  IMPRESSION: MRI HEAD  Acute/subacute infarct posterior limb left internal capsule.  Remote small infarct mid right coronal radiata.  Mild small vessel disease type changes  Opacification left mastoid cells without obstructing lesion the left eustachian tube detected.  MRA HEAD  No medium or large size vessel significant stenosis or occlusion.  No significant stenosis of the distal vertebral arteries. Fenestrated distal left vertebral artery  Mild narrowing mid aspect of the basilar artery.  Mild branch vessel irregularity.   Electronically Signed   By: Genia Del M.D.   On: 02/11/2015 20:45   Mr Mra Head/brain Wo Cm  02/11/2015   CLINICAL DATA:  81 year old hypertensive male with hyperlipidemia and history of prostate cancer presenting with confusion. Subsequent encounter.  EXAM: MRI HEAD WITHOUT CONTRAST  MRA HEAD WITHOUT CONTRAST  TECHNIQUE: Multiplanar, multiecho pulse sequences of the brain and surrounding structures were obtained without intravenous contrast. Angiographic images of the head were obtained using MRA technique without contrast.  COMPARISON:  02/11/2015 head CT.  FINDINGS: MRI HEAD FINDINGS  Acute/subacute infarct posterior limb left internal capsule.  Remote small infarct mid right coronal radiata.  Mild small vessel disease type changes  No intracranial mass lesion noted on this unenhanced exam.  No intracranial hemorrhage.  Global atrophy without hydrocephalus.  Opacification left mastoid cells without obstructing lesion the left eustachian tube detected.  Major intracranial vascular structures are patent.  Post lens replacement otherwise orbital structures  Cervical medullary junction, pituitary region pineal region within limits  MRA HEAD FINDINGS  Anterior circulation without medium or large size vessel significant stenosis or occlusion.  No significant stenosis of the distal vertebral arteries. Fenestrated distal left vertebral artery without associated aneurysm.  Mild narrowing mid aspect of the basilar artery.  Nonvisualized anterior inferior cerebellar arteries.  Mild branch vessel irregularity.  IMPRESSION: MRI HEAD  Acute/subacute infarct posterior limb left internal capsule.  Remote small infarct mid right coronal radiata.  Mild small vessel disease type changes  Opacification left mastoid cells without obstructing lesion the left eustachian tube detected.  MRA HEAD  No medium or large size vessel significant stenosis or occlusion.  No significant stenosis of the distal vertebral arteries. Fenestrated distal left vertebral artery  Mild  narrowing mid aspect of the basilar artery.  Mild branch vessel irregularity.   Electronically Signed   By: Genia Del M.D.   On: 02/11/2015 20:45    Assessment: 81 y.o. male presenting after an episode of confusion.  On Plavix at home.  Currently placed on ASA and Plavix.  MRI of the brain personally reviewed and shows an acute left internal capsule infarct.  No large vessel occlusion noted on MRA.  Patient with vascular risk factors.  Echocardiogram on 6/24 shows no evidence of intracardiac masses or thrombi, EF 60-65%.  A1c and lipid panel pending.  Dopplers pending.    Stroke Risk Factors - hyperlipidemia and hypertension  Plan: 1. HgbA1c, fasting lipid panel pending 2. PT consult, OT consult, Speech consult 3. Carotid dopplers pending 4. Prophylactic therapy-Continue antiplatelet therapy 5. Telemetry monitoring 6. Frequent neuro checks   Alexis Goodell, MD [facility] [phone] 02/12/2015, 5:18 AM

## 2015-02-12 NOTE — Evaluation (Signed)
Physical Therapy Evaluation Patient Details Name: Tatsuo Mcnell MRN: [id number] DOB: [birth year] Today's Date: 02/12/2015   History of Present Illness  81 year old male with hypertension, hyperlipidemia, recent TIA. Pt admitted for confusion. MRI positive for infarct in left internal capsule.  Clinical Impression  Pt admitted with above diagnosis. Pt currently with functional limitations due to the deficits listed below (see PT Problem List).  Pt will benefit from skilled PT to increase their independence and safety with mobility to allow discharge to the venue listed below.  Pt is moving well overall at S level with gait with one small LOB when coming out of tandem stance.  Will follow acutely for higher level balance challenges and stairs, but do not feel he will need any post acute care PT. Pt with some possible word finding problems vs. Memory. Nurse has requested speech eval.     Follow Up Recommendations No PT follow up    Equipment Recommendations  None recommended by PT    Recommendations for Other Services       Precautions / Restrictions Precautions Precautions: None Restrictions Weight Bearing Restrictions: No      Mobility  Bed Mobility Overal bed mobility: Modified Independent Bed Mobility: Supine to Sit;Sit to Supine     Supine to sit: Modified independent (Device/Increase time) Sit to supine:  (Mod I for sit to supine; cues for technique to scoot HOB)      Transfers Overall transfer level: Modified independent                  Ambulation/Gait Ambulation/Gait assistance: Supervision Ambulation Distance (Feet): 400 Feet Assistive device: None Gait Pattern/deviations: Step-through pattern;Staggering right     General Gait Details: Occasional stagger, but no LOB with  gait. Able to perform start/stops, head turns, head nods with no decrease in balance.  Stairs            Wheelchair Mobility    Modified Rankin (Stroke Patients  Only) Modified Rankin (Stroke Patients Only) Pre-Morbid Rankin Score: No symptoms Modified Rankin: No significant disability     Balance Overall balance assessment: Needs assistance           Standing balance-Leahy Scale: Good   Single Leg Stance - Right Leg: 11 Single Leg Stance - Left Leg: 10 Tandem Stance - Right Leg: 8 Tandem Stance - Left Leg: 7       High Level Balance Comments: When coming out of tandem stance had a LOB requiring him to grab counter, but no A from PT.             Pertinent Vitals/Pain Pain Assessment: No/denies pain    Home Living Family/patient expects to be discharged to:: Private residence Living Arrangements: Spouse/significant other Available Help at Discharge: Family;Available 24 hours/day Type of Home: House Home Access: Stairs to enter Entrance Stairs-Rails: Right;Left (no rails on 2 steps; rail on both side on 7 steps) Entrance Stairs-Number of Steps: 2 and 7 Home Layout: One level Home Equipment: Shower seat - built in      Prior Function Level of Independence: Independent               Hand Dominance        Extremity/Trunk Assessment   Upper Extremity Assessment: Defer to OT evaluation           Lower Extremity Assessment: Overall WFL for tasks assessed         Communication   Communication: HOH (difficulty getting words out-unsure cognition vs expressive)  Cognition Arousal/Alertness: Awake/alert Behavior During Therapy: WFL for tasks assessed/performed Overall Cognitive Status: Impaired/Different from baseline Area of Impairment: Orientation Orientation Level: Place   Memory: Decreased short-term memory       Problem Solving: Slow processing General Comments: Pt able to give vague answers, like being "sick" for reason he is in hospital.  Wife reports he is much better than he was yesterday, but not back to baseline.    General Comments General comments (skin integrity, edema, etc.): Wife  present    Exercises        Assessment/Plan    PT Assessment Patient needs continued PT services  PT Diagnosis Difficulty walking   PT Problem List Decreased balance;Decreased mobility;Decreased cognition;Decreased safety awareness  PT Treatment Interventions Stair training;Functional mobility training;Therapeutic activities;Therapeutic exercise;Gait training;Balance training;Patient/family education   PT Goals (Current goals can be found in the Care Plan section) Acute Rehab PT Goals Patient Stated Goal: go home PT Goal Formulation: With patient/family Time For Goal Achievement: 02/19/15 Potential to Achieve Goals: Good    Frequency Min 3X/week   Barriers to discharge        Co-evaluation               End of Session Equipment Utilized During Treatment: Gait belt Activity Tolerance: Patient tolerated treatment well Patient left: in chair;with family/visitor present;with nursing/sitter in room;with call bell/phone within reach Nurse Communication: Mobility status         Time: 4098-1191 PT Time Calculation (min) (ACUTE ONLY): 25 min   Charges:   PT Evaluation $Initial PT Evaluation Tier I: 1 Procedure PT Treatments $Gait Training: 8-22 mins   PT G Codes:        Cambelle Suchecki LUBECK 02/12/2015, 2:36 PM

## 2015-02-13 DIAGNOSIS — I10 Essential (primary) hypertension: Secondary | ICD-10-CM | POA: Insufficient documentation

## 2015-02-13 DIAGNOSIS — R410 Disorientation, unspecified: Secondary | ICD-10-CM

## 2015-02-13 DIAGNOSIS — G9340 Encephalopathy, unspecified: Secondary | ICD-10-CM

## 2015-02-13 LAB — BASIC METABOLIC PANEL
ANION GAP: 10 (ref 5–15)
BUN: 14 mg/dL (ref 6–20)
CO2: 25 mmol/L (ref 22–32)
CREATININE: 1.21 mg/dL (ref 0.61–1.24)
Calcium: 8.8 mg/dL — ABNORMAL LOW (ref 8.9–10.3)
Chloride: 107 mmol/L (ref 101–111)
GFR calc Af Amer: >60 mL/min (ref >60)
GFR calc non Af Amer: 53 mL/min — ABNORMAL LOW (ref >60)
Glucose, Bld: 110 mg/dL — ABNORMAL HIGH (ref 65–99)
POTASSIUM: 3.7 mmol/L (ref 3.5–5.1)
SODIUM: 142 mmol/L (ref 135–145)

## 2015-02-13 LAB — GLUCOSE, CAPILLARY: Glucose-Capillary: 103 mg/dL — ABNORMAL HIGH (ref 65–99)

## 2015-02-13 LAB — B. BURGDORFI ANTIBODIES: B burgdorferi Ab IgG+IgM: <0.91 {ISR} (ref 0.00–0.90)

## 2015-02-13 LAB — CBC
HEMATOCRIT: 43.2 % (ref 39.0–52.0)
HEMOGLOBIN: 14.2 g/dL (ref 13.0–17.0)
MCH: 30.3 pg (ref 26.0–34.0)
MCHC: 32.9 g/dL (ref 30.0–36.0)
MCV: 92.3 fL (ref 78.0–100.0)
Platelets: 188 10*3/uL (ref 150–400)
RBC: 4.68 MIL/uL (ref 4.22–5.81)
RDW: 13.8 % (ref 11.5–15.5)
WBC: 5.7 10*3/uL (ref 4.0–10.5)

## 2015-02-13 LAB — HEMOGLOBIN A1C
HEMOGLOBIN A1C: 5.1 % (ref 4.8–5.6)
MEAN PLASMA GLUCOSE: 100 mg/dL

## 2015-02-13 NOTE — Progress Notes (Signed)
STROKE TEAM PROGRESS NOTE   SUBJECTIVE (INTERVAL HISTORY) His wife is at the bedside.  She was unable to get carotid results yesterday. OT recommends ST for cognitive eval, kt has been ordered.   OBJECTIVE Temp:  [97.7 F (36.5 C)-98.2 F (36.8 C)] 98 F (36.7 C) (06/29 0913) Pulse Rate:  [49-69] 57 (06/29 0913) Cardiac Rhythm:  [-] Normal sinus rhythm (06/28 1946) Resp:  [16-20] 20 (06/29 0913) BP: (131-151)/(70-90) 147/80 mmHg (06/29 0913) SpO2:  [96 %-98 %] 97 % (06/29 0913)   Recent Labs Lab 02/12/15 0806 02/12/15 1145 02/12/15 1703 02/12/15 2115 02/13/15 0750  GLUCAP 95 96 112* 113* 103*    Recent Labs Lab 02/11/15 1402 02/11/15 2159 02/13/15 0451  NA 140  --  142  K 3.9  --  3.7  CL 107  --  107  CO2 25  --  25  GLUCOSE 108*  --  110*  BUN 13  --  14  CREATININE 1.25* 1.32* 1.21  CALCIUM 8.9  --  8.8*    Recent Labs Lab 02/11/15 1402  AST 19  ALT 16*  ALKPHOS 66  BILITOT 0.8  PROT 5.8*  ALBUMIN 3.5    Recent Labs Lab 02/11/15 1402 02/11/15 2159 02/13/15 0451  WBC 6.0 5.6 5.7  HGB 14.6 16.2 14.2  HCT 44.2 48.2 43.2  MCV 91.3 91.3 92.3  PLT 210 200 188   No results for input(s): CKTOTAL, CKMB, CKMBINDEX, TROPONINI in the last 168 hours.  Recent Labs  02/11/15 1402  LABPROT 14.3  INR 1.09    Recent Labs  02/11/15 1501  COLORURINE YELLOW  LABSPEC 1.013  PHURINE 6.0  GLUCOSEU NEGATIVE  HGBUR NEGATIVE  BILIRUBINUR NEGATIVE  KETONESUR NEGATIVE  PROTEINUR NEGATIVE  UROBILINOGEN 1.0  NITRITE NEGATIVE  LEUKOCYTESUR SMALL*       Component Value Date/Time   CHOL 133 02/12/2015 0635   TRIG 127 02/12/2015 0635   HDL 32* 02/12/2015 0635   CHOLHDL 4.2 02/12/2015 0635   VLDL 25 02/12/2015 0635   LDLCALC 76 02/12/2015 0635   Lab Results  Component Value Date   HGBA1C 5.1 02/11/2015      Component Value Date/Time   LABOPIA NONE DETECTED 02/11/2015 1500   COCAINSCRNUR NONE DETECTED 02/11/2015 1500   LABBENZ NONE DETECTED  02/11/2015 1500   AMPHETMU NONE DETECTED 02/11/2015 1500   THCU NONE DETECTED 02/11/2015 1500   LABBARB NONE DETECTED 02/11/2015 1500    No results for input(s): ETH in the last 168 hours.  Ct Head Wo Contrast 02/11/2015    Minimal small vessel chronic ischemic changes of deep cerebral white matter.  Tiny old lacunar infarct posterior RIGHT basal ganglia.  No acute intracranial abnormalities.     MRI HEAD   02/11/2015    Acute/subacute infarct posterior limb left internal capsule.  Remote small infarct mid right coronal radiata.  Mild small vessel disease type changes  Opacification left mastoid cells without obstructing lesion the left eustachian tube detected.   02/05/2014 (outpatient, prior to admission)  1.Subacute infarction in the left basal ganglia.  2.Mild leukoaraiosis, most likely due to chronic microvascular disease.  3.Left mastoid effusion and possible left middle ear effusion.  MRA HEAD   02/11/2015    No medium or large size vessel significant stenosis or occlusion.  No significant stenosis of the distal vertebral arteries. Fenestrated distal left vertebral artery  Mild narrowing mid aspect of the basilar artery.  Mild branch vessel irregularity.     2D Echocardiogram   -  Left ventricle: The cavity size was normal. Wall thickness wasincreased in a pattern of mild LVH. Systolic function was normal.The estimated ejection fraction was in the range of 60% to 65%.Wall motion was normal; there were no regional wall motionabnormalities. Doppler parameters are consistent with abnormalleft ventricular relaxation (grade 1 diastolic dysfunction).   PHYSICAL EXAM Pleasant elderly caucasian male not in distress. . Afebrile. Head is nontraumatic. Neck is supple without bruit.    Cardiac exam no murmur or gallop. Lungs are clear to auscultation. Distal pulses are well felt. Neurological Exam :  Awake alert oriented x 3. Diminished attention, registration and recall. Follows 2  step commands. ;  . Normal speech and language.eye movements full without nystagmus.fundi were not visualized. Vision acuity and fields appear normal. Hearing is normal. Palatal movements are normal. Face symmetric. Tongue midline. Normal strength, tone, reflexes and coordination. Normal sensation. Gait deferred.   ASSESSMENT/PLAN [name] is a 81 y.o. male with history of hx stroke last week (OP MRI positive for stroke) presenting with new onset confusion. He did not receive IV t-PA due to delay in arrival.   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 Deitra Swanson office on 6/23. No need to repeat. **I have called Dr. Deitra Swanson office - they are going to fax me the report when they fine it.  If L ICA > 70% it could be related to current stroke, recommend follow up.  2D Echo  No source of embolus   LDL 76  HgbA1c 5.1  Heparin 5000 units sq tid for VTE prophylaxis Diet Heart Room service appropriate?: Yes; Fluid consistency:: Thin Diet - low sodium heart healthy  clopidogrel 75 mg orally every day prior to admission, now on aspirin 325 mg orally every day and clopidogrel 75 mg orally every day. No indication to add aspirin. Will continue plavix alone, this is not a plavix failure.  Patient counseled to  be compliant with his antithrombotic medications  No need for additional stroke work up  Ongoing aggressive stroke risk factor management  Stay hydrated  Therapy recommendations:  OP OT & ST  Disposition:  Home with OP therapies  Ok for discharge from stroke standpoint  Follow up Dr. Leonie Swanson in 2 months. Order written  Hypertension  Stable  Hyperlipidemia  Home meds:  zocor 10, resumed in hospital  LDL 76, goal <  70  Continue statin at discharge  Other Stroke Risk Factors  Advanced age  Former Cigarette smoker, quit smoking  years ago ETOH use  Obesity, Body mass index is 33.91 kg/(m^2).   Dementia  Age related vs secondary to recent stroke  Hospital day # Barry Swanson for Pager information 02/13/2015 10:38 AM  I have personally examined this patient, reviewed notes, independently viewed imaging studies, participated in medical decision making and plan of care. I have made any additions or clarifications directly to the above note. Agree with note above. Check carotid ultrasound results done in Dr. Celestia Khat office prior to discharge.  Antony Contras, MD Medical Director [facility] Pager: [phone] 02/13/2015 2:07 PM   To contact Stroke Continuity provider, please refer to [email or web address] After hours, contact General  Neurology

## 2015-02-13 NOTE — Progress Notes (Addendum)
Occupational Therapy Treatment Patient Details Name: Barry Swanson MRN: [id number] DOB: 1929-07-29 Today's Date: 02/13/2015    History of present illness 81 year old male with hypertension, hyperlipidemia, recent TIA. Pt admitted for confusion. MRI positive for infarct in left internal capsule.   OT comments  Administered the [facility] Cognitive Assessment [facility]) screening test to pt today to further assess cognition and pt with apparent significant cognitive deficits. He scored 11/30 (above 26=normal). Continue to recommend Outpatient OT and recommended for them to go ahead and set up appointment with outpatient OT. Recommended no driving at this time. Recommended pt doing his normal routine of things around the house.  Follow Up Recommendations  Outpatient OT;Supervision/Assistance - 24 hour    Equipment Recommendations  None recommended by OT    Recommendations for Other Services Speech consult    Precautions / Restrictions Precautions Precautions: None Restrictions Weight Bearing Restrictions: No       Mobility Bed Mobility Overal bed mobility: Modified Independent Bed Mobility: Supine to Sit              Transfers                 General transfer comment: not assessed    Balance  No LOB sitting EOB.                                       Vision                     Perception     Praxis      Cognition  Awake/Alert Behavior During Therapy: WFL for tasks assessed/performed Overall Cognitive Status: Impaired/Different from baseline Area of Impairment: Orientation;Memory;Problem solving (initially oriented, but later not) Orientation Level: Disoriented to;Place;Time   Memory: Decreased short-term memory        Problem Solving: Slow processing  Comments: OT administered the MOCA screening test to pt and he scored 11/30 (above 26=normal). Pt reports his cognition is not baseline.      Extremity/Trunk Assessment                Exercises     Shoulder Instructions       General Comments      Pertinent Vitals/ Pain       Pain Assessment: Faces Pain Score: 0-No pain  Home Living                                          Prior Functioning/Environment              Frequency Min 2X/week     Progress Toward Goals  OT Goals(current goals can now be found in the care plan section)  Progress towards OT goals: Progressing toward goals  Acute Rehab OT Goals Patient Stated Goal: not stated OT Goal Formulation: With patient Time For Goal Achievement: 02/19/15 Potential to Achieve Goals: Good ADL Goals Pt Will Perform Lower Body Bathing: with modified independence;sit to/from stand Pt Will Perform Lower Body Dressing: with modified independence;sit to/from stand Additional ADL Goal #1: Pt will complete MOCA cognitive test.  Plan Discharge plan remains appropriate    Co-evaluation                 End of Session  Pt left in bed with family present  Activity Tolerance Patient tolerated treatment well   Patient Left in bed;with family/visitor present   Nurse Communication          Time: 6160-7371 OT Time Calculation (min): 36 min  Charges: OT General Charges $OT Visit: 1 Procedure OT Treatments $Therapeutic Activity: 23-37 mins  Benito Mccreedy OTR/L [phone] 02/13/2015, 11:49 AM

## 2015-02-13 NOTE — Discharge Summary (Signed)
Physician Discharge Summary  Alejos Arline ZOX:[id number] DOB: 1929/02/04 DOA: 02/11/2015  PCP: Ezequiel Kayser, MD  Admit date: 02/11/2015 Discharge date: 02/13/2015  Time spent: 45 minutes  Recommendations for Outpatient Follow-up:  1. Dr.Sethi in 1 month  Discharge Diagnoses:  Principal Problem:   Acute encephalopathy Active Problems:   TIA (transient ischemic attack)   Hypertension   Hyperlipidemia   Essential hypertension   Dementia   CKD 2  Discharge Condition: stable  Diet recommendation: heart healthy  Filed Weights   02/11/15 1318  Weight: 95.255 kg (210 lb)    History of present illness:  Chief Complaint: Confusion HPI: Patient is a 81 year old male with hypertension, hyperlipidemia, recent TIA had outpatient workup with Dr. Waynard Edwards. Per patient's wife, he had similar symptoms at that time. [name] reported that he was in his normal state of health until yesterday. This morning he woke up and appeared to be somewhat confused. Per patient's wife, he did not remember what he ate at breakfast, which TV channel he was watching. Patient feels better at the time of the examination. Otherwise he denied any numbness, tingling, blurred vision, headache, focal weakness. [name] reported that they were told by the PCP to bring him to the ER if he has any neurological symptoms.  CT head negative for acute stroke, UA negative for UTI   Hospital Course:  Subacute left PLIC infarct secondary to small vessel disease source with transient worsening of symptoms - no new stroke on MRI, mentation improved back to baseline -Seen by NEurology in consultation - MRI L PLIC Subacute infarct (likely same stroke as from MRI last week) per neurologist  - MRA No large vessel stenosis - Carotid Dopplerdone as an OP at Dr. Janene Harvey office. Showed Bilateral atherosclerosis without flow-limiting stenosis, less than 50% bilaterally.- recent 2D ECHO last week EF 60% with grade I  diastolic CHF, no source of embolus  - LDL 76, HgbA1c pending - Per NEurology, continue Plavix  - no need for additional stroke work up per neurology team  - Ongoing aggressive stroke risk factor management  Acute encephalopathy - likely secondary to the above - resolved   Hypertension - continue Irbesartan as per home medical regimen   Hyperlipidemia - continue statin   CKD 2 -stable  Dementia - Age related vs secondary to recent stroke  Consultations:  Neurology  Discharge Exam: Filed Vitals:   02/13/15 0913  BP: 147/80  Pulse: 57  Temp: 98 F (36.7 C)  Resp: 20    General: AAOx2 Cardiovascular: S1S2/RRR Respiratory: CTAB  Discharge Instructions   Discharge Instructions    Diet - low sodium heart healthy    Complete by:  As directed      Increase activity slowly    Complete by:  As directed           Discharge Medication List as of 02/13/2015 11:36 AM    CONTINUE these medications which have NOT CHANGED   Details  alendronate (FOSAMAX) 70 MG tablet Take 70 mg by mouth once a week. Sunday, Starting 02/11/2015, Until Discontinued, Historical Med    clopidogrel (PLAVIX) 75 MG tablet Take 1 tablet by mouth daily., Starting 02/06/2015, Until Discontinued, Historical Med    Coenzyme Q10 (COQ10 PO) Take 1 tablet by mouth daily., Until Discontinued, Historical Med    felodipine (PLENDIL) 10 MG 24 hr tablet Take 10 mg by mouth daily., Until Discontinued, Historical Med    finasteride (PROSCAR) 5 MG tablet Take 1 tablet by mouth daily.,  Starting 01/30/2015, Until Discontinued, Historical Med    Fish Oil-Cholecalciferol (OMEGA-3 FISH OIL/VITAMIN D3) 1000-1000 MG-UNIT CAPS Take 1 tablet by mouth daily., Until Discontinued, Historical Med    GARLIC PO Take 1 tablet by mouth daily., Until Discontinued, Historical Med    Multiple Vitamin (MULTIVITAMIN) tablet Take 1 tablet by mouth daily., Until Discontinued, Historical Med    niacin 500 MG tablet Take 500  mg by mouth at bedtime., Until Discontinued, Historical Med    Potassium Aminobenzoate 500 MG TABS Take 1 tablet by mouth daily., Until Discontinued, Historical Med    Probiotic Product (ALIGN) 4 MG CAPS Take 1 capsule by mouth daily., Until Discontinued, Historical Med    simvastatin (ZOCOR) 10 MG tablet Take 10 mg by mouth daily., Until Discontinued, Historical Med    tamsulosin (FLOMAX) 0.4 MG CAPS capsule Take 1 capsule by mouth daily as needed., Starting 12/16/2014, Until Discontinued, Historical Med    valsartan (DIOVAN) 160 MG tablet Take 160 mg by mouth daily as needed (Takes if BP too high). , Until Discontinued, Historical Med       No Known Allergies Follow-up Information    Follow up with PERINI,MARK A, MD. Schedule an appointment as soon as possible for a visit in 1 week.   Specialty:  Internal Medicine   Contact information:   [address] [place] [address] [phone]       Follow up with SETHI,PRAMOD, MD. Schedule an appointment as soon as possible for a visit in 1 month.   Specialties:  Neurology, Radiology   Contact information:   [address] [place] [address] [phone]        The results of significant diagnostics from this hospitalization (including imaging, microbiology, ancillary and laboratory) are listed below for reference.    Significant Diagnostic Studies: Ct Head Wo Contrast  02/11/2015   CLINICAL DATA:  TIA last week, normal when he awoke this morning but was unable to work the remote beginning at 0900 hours, confusion, history hypertension, hyperlipidemia, prostate cancer  EXAM: CT HEAD WITHOUT CONTRAST  TECHNIQUE: Contiguous axial images were obtained from the base of the skull through the vertex without intravenous contrast.  COMPARISON:  None  FINDINGS: Generalized atrophy.  Normal ventricular morphology.  No midline shift or mass effect.  Small vessel chronic ischemic changes of deep cerebral white matter.  Tiny old  lacunar infarct at posterior RIGHT basal ganglia.  No intracranial hemorrhage, mass lesion, or acute infarction.  No extra-axial fluid collections.  Visualized paranasal sinuses and mastoid air cells clear.  Bones unremarkable.  Atherosclerotic calcifications of internal carotid and vertebral arteries at skullbase.  IMPRESSION: Minimal small vessel chronic ischemic changes of deep cerebral white matter.  Tiny old lacunar infarct posterior RIGHT basal ganglia.  No acute intracranial abnormalities.   Electronically Signed   By: Ulyses Southward M.D.   On: 02/11/2015 15:14   Mri Brain Without Contrast  02/11/2015   CLINICAL DATA:  81 year old hypertensive male with hyperlipidemia and history of prostate cancer presenting with confusion. Subsequent encounter.  EXAM: MRI HEAD WITHOUT CONTRAST  MRA HEAD WITHOUT CONTRAST  TECHNIQUE: Multiplanar, multiecho pulse sequences of the brain and surrounding structures were obtained without intravenous contrast. Angiographic images of the head were obtained using MRA technique without contrast.  COMPARISON:  02/11/2015 head CT.  FINDINGS: MRI HEAD FINDINGS  Acute/subacute infarct posterior limb left internal capsule.  Remote small infarct mid right coronal radiata.  Mild small vessel disease type changes  No intracranial  mass lesion noted on this unenhanced exam.  No intracranial hemorrhage.  Global atrophy without hydrocephalus.  Opacification left mastoid cells without obstructing lesion the left eustachian tube detected.  Major intracranial vascular structures are patent.  Post lens replacement otherwise orbital structures  Cervical medullary junction, pituitary region pineal region within limits  MRA HEAD FINDINGS  Anterior circulation without medium or large size vessel significant stenosis or occlusion.  No significant stenosis of the distal vertebral arteries. Fenestrated distal left vertebral artery without associated aneurysm.  Mild narrowing mid aspect of the basilar  artery.  Nonvisualized anterior inferior cerebellar arteries.  Mild branch vessel irregularity.  IMPRESSION: MRI HEAD  Acute/subacute infarct posterior limb left internal capsule.  Remote small infarct mid right coronal radiata.  Mild small vessel disease type changes  Opacification left mastoid cells without obstructing lesion the left eustachian tube detected.  MRA HEAD  No medium or large size vessel significant stenosis or occlusion.  No significant stenosis of the distal vertebral arteries. Fenestrated distal left vertebral artery  Mild narrowing mid aspect of the basilar artery.  Mild branch vessel irregularity.   Electronically Signed   By: Lacy Duverney M.D.   On: 02/11/2015 20:45   Mr Maxine Glenn Head/brain Wo Cm  02/11/2015   CLINICAL DATA:  81 year old hypertensive male with hyperlipidemia and history of prostate cancer presenting with confusion. Subsequent encounter.  EXAM: MRI HEAD WITHOUT CONTRAST  MRA HEAD WITHOUT CONTRAST  TECHNIQUE: Multiplanar, multiecho pulse sequences of the brain and surrounding structures were obtained without intravenous contrast. Angiographic images of the head were obtained using MRA technique without contrast.  COMPARISON:  02/11/2015 head CT.  FINDINGS: MRI HEAD FINDINGS  Acute/subacute infarct posterior limb left internal capsule.  Remote small infarct mid right coronal radiata.  Mild small vessel disease type changes  No intracranial mass lesion noted on this unenhanced exam.  No intracranial hemorrhage.  Global atrophy without hydrocephalus.  Opacification left mastoid cells without obstructing lesion the left eustachian tube detected.  Major intracranial vascular structures are patent.  Post lens replacement otherwise orbital structures  Cervical medullary junction, pituitary region pineal region within limits  MRA HEAD FINDINGS  Anterior circulation without medium or large size vessel significant stenosis or occlusion.  No significant stenosis of the distal vertebral  arteries. Fenestrated distal left vertebral artery without associated aneurysm.  Mild narrowing mid aspect of the basilar artery.  Nonvisualized anterior inferior cerebellar arteries.  Mild branch vessel irregularity.  IMPRESSION: MRI HEAD  Acute/subacute infarct posterior limb left internal capsule.  Remote small infarct mid right coronal radiata.  Mild small vessel disease type changes  Opacification left mastoid cells without obstructing lesion the left eustachian tube detected.  MRA HEAD  No medium or large size vessel significant stenosis or occlusion.  No significant stenosis of the distal vertebral arteries. Fenestrated distal left vertebral artery  Mild narrowing mid aspect of the basilar artery.  Mild branch vessel irregularity.   Electronically Signed   By: Lacy Duverney M.D.   On: 02/11/2015 20:45    Microbiology: No results found for this or any previous visit (from the past 240 hour(s)).   Labs: Basic Metabolic Panel:  Recent Labs Lab 02/11/15 1402 02/11/15 2159 02/13/15 0451  NA 140  --  142  K 3.9  --  3.7  CL 107  --  107  CO2 25  --  25  GLUCOSE 108*  --  110*  BUN 13  --  14  CREATININE 1.25* 1.32* 1.21  CALCIUM 8.9  --  8.8*   Liver Function Tests:  Recent Labs Lab 02/11/15 1402  AST 19  ALT 16*  ALKPHOS 66  BILITOT 0.8  PROT 5.8*  ALBUMIN 3.5   No results for input(s): LIPASE, AMYLASE in the last 168 hours. No results for input(s): AMMONIA in the last 168 hours. CBC:  Recent Labs Lab 02/11/15 1402 02/11/15 2159 02/13/15 0451  WBC 6.0 5.6 5.7  HGB 14.6 16.2 14.2  HCT 44.2 48.2 43.2  MCV 91.3 91.3 92.3  PLT 210 200 188   Cardiac Enzymes: No results for input(s): CKTOTAL, CKMB, CKMBINDEX, TROPONINI in the last 168 hours. BNP: BNP (last 3 results) No results for input(s): BNP in the last 8760 hours.  ProBNP (last 3 results) No results for input(s): PROBNP in the last 8760 hours.  CBG:  Recent Labs Lab 02/12/15 0806 02/12/15 1145  02/12/15 1703 02/12/15 2115 02/13/15 0750  GLUCAP 95 96 112* 113* 103*       Signed:  Dayannara Pascal  [facility] 02/13/2015, 4:25 PM

## 2015-02-13 NOTE — Progress Notes (Signed)
Carotid Doppler resulted done at [facility] on February 07, 2015.  R carotid:  Mixed plaque. Normal velocities.  L carotid:  Minimal intimal thickening and plaque. Normal velocities.  Impression:  Bilateral atherosclerosis without flow-limiting stenosis, less than 50% bilaterally.  [place] for Pager information 02/13/2015 11:29 AM

## 2015-02-13 NOTE — Progress Notes (Signed)
Pt needs cognitive-linguistic eval; however, order was placed for bedside swallow eval. Please change order so that eval can be completed. Thank you,  Blair Heys, [place], [place]

## 2015-02-13 NOTE — Progress Notes (Signed)
Patient was discharged home by MD order; discharged instructions  review and give to patient with care notes; IV DIC; skin intact; patient will be escorted to the car by nurse tech via wheelchair.  

## 2015-02-14 DIAGNOSIS — R410 Disorientation, unspecified: Secondary | ICD-10-CM | POA: Insufficient documentation

## 2015-02-22 DIAGNOSIS — Z6831 Body mass index (BMI) 31.0-31.9, adult: Secondary | ICD-10-CM | POA: Diagnosis not present

## 2015-02-22 DIAGNOSIS — R7301 Impaired fasting glucose: Secondary | ICD-10-CM | POA: Diagnosis not present

## 2015-02-22 DIAGNOSIS — I638 Other cerebral infarction: Secondary | ICD-10-CM | POA: Diagnosis not present

## 2015-02-22 DIAGNOSIS — I10 Essential (primary) hypertension: Secondary | ICD-10-CM | POA: Diagnosis not present

## 2015-02-22 DIAGNOSIS — E785 Hyperlipidemia, unspecified: Secondary | ICD-10-CM | POA: Diagnosis not present

## 2015-02-22 DIAGNOSIS — R413 Other amnesia: Secondary | ICD-10-CM | POA: Diagnosis not present

## 2015-02-28 ENCOUNTER — Emergency Department (HOSPITAL_COMMUNITY): Payer: Medicare Other

## 2015-02-28 ENCOUNTER — Emergency Department (HOSPITAL_COMMUNITY)
Admission: EM | Admit: 2015-02-28 | Discharge: 2015-02-28 | Disposition: A | Discharge status: Home / Self Care | Payer: Medicare Other | Attending: Emergency Medicine | Admitting: Emergency Medicine

## 2015-02-28 ENCOUNTER — Encounter (HOSPITAL_COMMUNITY): Payer: Self-pay

## 2015-02-28 DIAGNOSIS — Z8673 Personal history of transient ischemic attack (TIA), and cerebral infarction without residual deficits: Secondary | ICD-10-CM | POA: Diagnosis not present

## 2015-02-28 DIAGNOSIS — E785 Hyperlipidemia, unspecified: Secondary | ICD-10-CM | POA: Diagnosis not present

## 2015-02-28 DIAGNOSIS — Z8619 Personal history of other infectious and parasitic diseases: Secondary | ICD-10-CM | POA: Insufficient documentation

## 2015-02-28 DIAGNOSIS — R110 Nausea: Secondary | ICD-10-CM | POA: Insufficient documentation

## 2015-02-28 DIAGNOSIS — T7840XA Allergy, unspecified, initial encounter: Secondary | ICD-10-CM

## 2015-02-28 DIAGNOSIS — Z87891 Personal history of nicotine dependence: Secondary | ICD-10-CM | POA: Insufficient documentation

## 2015-02-28 DIAGNOSIS — T466X5A Adverse effect of antihyperlipidemic and antiarteriosclerotic drugs, initial encounter: Secondary | ICD-10-CM | POA: Diagnosis not present

## 2015-02-28 DIAGNOSIS — Z79899 Other long term (current) drug therapy: Secondary | ICD-10-CM | POA: Insufficient documentation

## 2015-02-28 DIAGNOSIS — L500 Allergic urticaria: Secondary | ICD-10-CM | POA: Diagnosis not present

## 2015-02-28 DIAGNOSIS — R42 Dizziness and giddiness: Secondary | ICD-10-CM | POA: Diagnosis not present

## 2015-02-28 DIAGNOSIS — I10 Essential (primary) hypertension: Secondary | ICD-10-CM | POA: Insufficient documentation

## 2015-02-28 DIAGNOSIS — R531 Weakness: Secondary | ICD-10-CM | POA: Diagnosis not present

## 2015-02-28 DIAGNOSIS — Z8546 Personal history of malignant neoplasm of prostate: Secondary | ICD-10-CM | POA: Diagnosis not present

## 2015-02-28 DIAGNOSIS — R404 Transient alteration of awareness: Secondary | ICD-10-CM | POA: Diagnosis not present

## 2015-02-28 DIAGNOSIS — R21 Rash and other nonspecific skin eruption: Secondary | ICD-10-CM | POA: Diagnosis present

## 2015-02-28 LAB — CBC
HCT: 48.2 % (ref 39.0–52.0)
Hemoglobin: 16 g/dL (ref 13.0–17.0)
MCH: 30.9 pg (ref 26.0–34.0)
MCHC: 33.2 g/dL (ref 30.0–36.0)
MCV: 93.1 fL (ref 78.0–100.0)
Platelets: 194 10*3/uL (ref 150–400)
RBC: 5.18 MIL/uL (ref 4.22–5.81)
RDW: 14.1 % (ref 11.5–15.5)
WBC: 10.6 10*3/uL — ABNORMAL HIGH (ref 4.0–10.5)

## 2015-02-28 LAB — URINALYSIS, ROUTINE W REFLEX MICROSCOPIC
Glucose, UA: NEGATIVE mg/dL
KETONES UR: 15 mg/dL — AB
NITRITE: NEGATIVE
PROTEIN: 100 mg/dL — AB
Specific Gravity, Urine: 1.026 (ref 1.005–1.030)
UROBILINOGEN UA: 1 mg/dL (ref 0.0–1.0)
pH: 5 (ref 5.0–8.0)

## 2015-02-28 LAB — BASIC METABOLIC PANEL
Anion gap: 8 (ref 5–15)
BUN: 15 mg/dL (ref 6–20)
CHLORIDE: 111 mmol/L (ref 101–111)
CO2: 23 mmol/L (ref 22–32)
CREATININE: 1.51 mg/dL — AB (ref 0.61–1.24)
Calcium: 8.7 mg/dL — ABNORMAL LOW (ref 8.9–10.3)
GFR calc Af Amer: 47 mL/min — ABNORMAL LOW (ref >60)
GFR calc non Af Amer: 41 mL/min — ABNORMAL LOW (ref >60)
GLUCOSE: 128 mg/dL — AB (ref 65–99)
POTASSIUM: 3.7 mmol/L (ref 3.5–5.1)
Sodium: 142 mmol/L (ref 135–145)

## 2015-02-28 LAB — I-STAT TROPONIN, ED: TROPONIN I, POC: 0.01 ng/mL (ref 0.00–0.08)

## 2015-02-28 LAB — URINE MICROSCOPIC-ADD ON

## 2015-02-28 LAB — CBG MONITORING, ED: GLUCOSE-CAPILLARY: 106 mg/dL — AB (ref 65–99)

## 2015-02-28 MED ORDER — ASPIRIN 81 MG PO CHEW: 324.0000 mg | CHEWABLE_TABLET | Freq: Every day | ORAL | Status: DC

## 2015-02-28 NOTE — ED Provider Notes (Addendum)
CSN: [id number]     Arrival date & time 02/28/15  1218 History   First MD Initiated Contact with Patient 02/28/15 1511     Chief Complaint  Patient presents with  . Dizziness     (Consider location/radiation/quality/duration/timing/severity/associated sxs/prior Treatment) HPI Comments: Patient notes that 3 days ago he started to develop a rash on his arms, chest and back. It itches and it initially showed up likely whelps and swelling. The only thing new he's had is starting Plavix within the last 3 weeks. He denies any other new medications, foods, detergents, soaps, lotion. He currently feels his normal self. He states he's been eating and drinking normally  Patient is a 81 y.o. male presenting with dizziness. The history is provided by the patient and the spouse.  Dizziness Quality:  Lightheadedness Severity:  Moderate Onset quality:  Gradual Duration:  1 hour Timing:  Constant Progression:  Resolved Chronicity:  New Context comment:  Started when he woke up this morning and improved after about 1 hour. Relieved by:  None tried Worsened by:  Nothing Associated symptoms: nausea   Associated symptoms: no chest pain, no diarrhea, no headaches, no palpitations, no shortness of breath, no syncope, no vomiting and no weakness   Risk factors: new medications   Risk factors comment:  History of recent stroke and was placed on Plavix 3 weeks ago.   Past Medical History  Diagnosis Date  . Hypertension   . Prostate cancer 2008  . Hyperlipidemia   . Hepatitis B   . Stroke    Past Surgical History  Procedure Laterality Date  . Bladder stone removal      blasted  . Prostate surgery      trimmed prostate away from urethral tract  . Cataract extraction w/ intraocular lens  implant, bilateral  2004   Family History  Problem Relation Age of Onset  . Heart disease Mother   . Heart disease Father   . Lung cancer Brother   . Parkinson's disease Sister   . Leukemia Brother     History  Substance Use Topics  . Smoking status: Former Smoker    Types: Cigarettes    Quit date: 08/17/1998  . Smokeless tobacco: Current User    Types: Chew  . Alcohol Use: Yes     Comment: rarely-1-2 beers a year    Review of Systems  Respiratory: Negative for shortness of breath.   Cardiovascular: Negative for chest pain, palpitations and syncope.  Gastrointestinal: Positive for nausea. Negative for vomiting and diarrhea.  Neurological: Positive for dizziness. Negative for weakness and headaches.  All other systems reviewed and are negative.     Allergies  Review of patient's allergies indicates no known allergies.  Home Medications   Prior to Admission medications   Medication Sig Start Date End Date Taking? Authorizing Provider  alendronate (FOSAMAX) 70 MG tablet Take 70 mg by mouth once a week. Sunday 02/11/15  Yes Historical Provider, MD  clopidogrel (PLAVIX) 75 MG tablet Take 1 tablet by mouth daily. 02/06/15  Yes Historical Provider, MD  Coenzyme Q10 (COQ10 PO) Take 1 tablet by mouth daily.   Yes Historical Provider, MD  felodipine (PLENDIL) 10 MG 24 hr tablet Take 10 mg by mouth daily.   Yes Historical Provider, MD  finasteride (PROSCAR) 5 MG tablet Take 1 tablet by mouth daily. 01/30/15  Yes Historical Provider, MD  Fish Oil-Cholecalciferol (OMEGA-3 FISH OIL/VITAMIN D3) 1000-1000 MG-UNIT CAPS Take 1 tablet by mouth daily.   Yes Historical  Provider, MD  GARLIC PO Take 1 tablet by mouth daily.   Yes Historical Provider, MD  Multiple Vitamin (MULTIVITAMIN) tablet Take 1 tablet by mouth daily.   Yes Historical Provider, MD  Multiple Vitamins-Minerals (CENTRUM SILVER) tablet Take 1 tablet by mouth daily. 09/13/12  Yes Historical Provider, MD  niacin 500 MG tablet Take 500 mg by mouth at bedtime.   Yes Historical Provider, MD  Potassium Aminobenzoate 500 MG TABS Take 1 tablet by mouth daily.   Yes Historical Provider, MD  Probiotic Product (ALIGN) 4 MG CAPS Take 1 capsule  by mouth daily.   Yes Historical Provider, MD  simvastatin (ZOCOR) 10 MG tablet Take 10 mg by mouth daily.   Yes Historical Provider, MD  tamsulosin (FLOMAX) 0.4 MG CAPS capsule Take 1 capsule by mouth daily as needed. 12/16/14  Yes Historical Provider, MD  valsartan (DIOVAN) 160 MG tablet Take 160 mg by mouth daily as needed (Takes if BP too high).    Yes Historical Provider, MD   BP 104/62 mmHg  Pulse 64  Temp(Src) 98.7 F (37.1 C) (Oral)  Resp 21  SpO2 96% Physical Exam  Constitutional: He is oriented to person, place, and time. He appears well-developed and well-nourished. No distress.  HENT:  Head: Normocephalic and atraumatic.  Mouth/Throat: Oropharynx is clear and moist.  Eyes: Conjunctivae and EOM are normal. Pupils are equal, round, and reactive to light.  Neck: Normal range of motion. Neck supple.  Cardiovascular: Normal rate, regular rhythm and intact distal pulses.   No murmur heard. Pulmonary/Chest: Effort normal and breath sounds normal. No respiratory distress. He has no wheezes. He has no rales.  Abdominal: Soft. He exhibits no distension. There is no tenderness. There is no rebound and no guarding.  Musculoskeletal: Normal range of motion. He exhibits no edema or tenderness.  Neurological: He is alert and oriented to person, place, and time.  Skin: Skin is warm and dry. Rash noted. Rash is maculopapular and urticarial. No erythema.  Pronounced confluent rash over the upper arms, back, buttocks, inner thighs that blanches and has associated edema.  Psychiatric: He has a normal mood and affect. His behavior is normal.  Nursing note and vitals reviewed.   ED Course  Procedures (including critical care time) Labs Review Labs Reviewed  BASIC METABOLIC PANEL - Abnormal; Notable for the following:    Glucose, Bld 128 (*)    Creatinine, Ser 1.51 (*)    Calcium 8.7 (*)    GFR calc non Af Amer 41 (*)    GFR calc Af Amer 47 (*)    All other components within normal limits   CBC - Abnormal; Notable for the following:    WBC 10.6 (*)    All other components within normal limits  URINALYSIS, ROUTINE W REFLEX MICROSCOPIC (NOT AT [facility]) - Abnormal; Notable for the following:    Color, Urine ORANGE (*)    APPearance CLOUDY (*)    Hgb urine dipstick LARGE (*)    Bilirubin Urine MODERATE (*)    Ketones, ur 15 (*)    Protein, ur 100 (*)    Leukocytes, UA SMALL (*)    All other components within normal limits  URINE MICROSCOPIC-ADD ON - Abnormal; Notable for the following:    Squamous Epithelial / LPF MANY (*)    Bacteria, UA FEW (*)    Casts HYALINE CASTS (*)    Crystals CA OXALATE CRYSTALS (*)    All other components within normal limits  CBG MONITORING,  ED - Abnormal; Notable for the following:    Glucose-Capillary 106 (*)    All other components within normal limits  I-STAT TROPOININ, ED    Imaging Review Dg Chest 2 View  02/28/2015   CLINICAL DATA:  Dizziness and weakness for 1 day  EXAM: CHEST  2 VIEW  COMPARISON:  None.  FINDINGS: There is no edema or consolidation. The heart is upper normal in size with pulmonary vascularity within normal limits. No adenopathy. No bone lesions. There is mild degenerative change in the thoracic spine.  IMPRESSION: No edema or consolidation.   Electronically Signed   By: Lowella Grip III M.D.   On: 02/28/2015 16:43     EKG Interpretation   Date/Time:  Thursday February 28 2015 12:26:03 EDT Ventricular Rate:  75 PR Interval:  190 QRS Duration: 97 QT Interval:  411 QTC Calculation: 459 R Axis:   -36 Text Interpretation:  Sinus rhythm Left axis deviation Low voltage,  precordial leads Consider anterior infarct No significant change since  last tracing Confirmed by Maryan Rued  MD, Loree Fee ([id number]) on 02/28/2015  3:32:50 PM      MDM   Final diagnoses:  Allergic reaction to drug    Patient presented with a few hours of feeling lightheaded and nauseated today after he woke up between 9 and 10:00. He denies  vomiting, abdominal pain or chest pain. He denies any shortness of breath and has had a good appetite recently has felt well until Tuesday when he developed a rash. The rash is gradually gotten worse over the last 3 days. The only thing he knows of that is new as his Plavix which she started within the last 3 weeks. He has only had one other allergic reaction 20 and a biotic in the past and states he felt similar to that. Patient has recently decreased his blood pressure medications and denies any other changes. No fever, cough or infectious symptoms.  Patient's EKG without acute findings. UA without signs of new infection. BMP with mild elevation of his creatinine of 1.5 from baseline of 1.3 and CBC with mild septate cysts of 10,000. Patient is otherwise well-appearing here other than the rash. Feel it is reasonable that the rash is coming from the Plavix. Will discuss with neurology about potentially stopping the Plavix and putting patient on a full strength aspirin until seen by neurology.  5:03 PM Spoke with Dr. Leonie Man and plavix will be stopped adn will start 325 ASA Blanchie Dessert, MD 02/28/15 [place], MD 02/28/15 [place], MD 02/28/15 1729

## 2015-02-28 NOTE — Discharge Instructions (Signed)
Allergies °Allergies may happen from anything your body is sensitive to. This may be food, medicines, pollens, chemicals, and nearly anything around you in everyday life that produces allergens. An allergen is anything that causes an allergy producing substance. Heredity is often a factor in causing these problems. This means you may have some of the same allergies as your parents. °Food allergies happen in all age groups. Food allergies are some of the most severe and life threatening. Some common food allergies are cow's milk, seafood, eggs, nuts, wheat, and soybeans. °SYMPTOMS  °· Swelling around the mouth. °· An itchy red rash or hives. °· Vomiting or diarrhea. °· Difficulty breathing. °SEVERE ALLERGIC REACTIONS ARE LIFE-THREATENING. °This reaction is called anaphylaxis. It can cause the mouth and throat to swell and cause difficulty with breathing and swallowing. In severe reactions only a trace amount of food (for example, peanut oil in a salad) may cause death within seconds. °Seasonal allergies occur in all age groups. These are seasonal because they usually occur during the same season every year. They may be a reaction to molds, grass pollens, or tree pollens. Other causes of problems are house dust mite allergens, pet dander, and mold spores. The symptoms often consist of nasal congestion, a runny itchy nose associated with sneezing, and tearing itchy eyes. There is often an associated itching of the mouth and ears. The problems happen when you come in contact with pollens and other allergens. Allergens are the particles in the air that the body reacts to with an allergic reaction. This causes you to release allergic antibodies. Through a chain of events, these eventually cause you to release histamine into the blood stream. Although it is meant to be protective to the body, it is this release that causes your discomfort. This is why you were given anti-histamines to feel better.  If you are unable to  pinpoint the offending allergen, it may be determined by skin or blood testing. Allergies cannot be cured but can be controlled with medicine. °Hay fever is a collection of all or some of the seasonal allergy problems. It may often be treated with simple over-the-counter medicine such as diphenhydramine. Take medicine as directed. Do not drink alcohol or drive while taking this medicine. Check with your caregiver or package insert for child dosages. °If these medicines are not effective, there are many new medicines your caregiver can prescribe. Stronger medicine such as nasal spray, eye drops, and corticosteroids may be used if the first things you try do not work well. Other treatments such as immunotherapy or desensitizing injections can be used if all else fails. Follow up with your caregiver if problems continue. These seasonal allergies are usually not life threatening. They are generally more of a nuisance that can often be handled using medicine. °HOME CARE INSTRUCTIONS  °· If unsure what causes a reaction, keep a diary of foods eaten and symptoms that follow. Avoid foods that cause reactions. °· If hives or rash are present: °¨ Take medicine as directed. °¨ You may use an over-the-counter antihistamine (diphenhydramine) for hives and itching as needed. °¨ Apply cold compresses (cloths) to the skin or take baths in cool water. Avoid hot baths or showers. Heat will make a rash and itching worse. °· If you are severely allergic: °¨ Following a treatment for a severe reaction, hospitalization is often required for closer follow-up. °¨ Wear a medic-alert bracelet or necklace stating the allergy. °¨ You and your family must learn how to give adrenaline or use   an anaphylaxis kit.  If you have had a severe reaction, always carry your anaphylaxis kit or EpiPen with you. Use this medicine as directed by your caregiver if a severe reaction is occurring. Failure to do so could have a fatal outcome. SEEK MEDICAL  CARE IF:  You suspect a food allergy. Symptoms generally happen within 30 minutes of eating a food.  Your symptoms have not gone away within 2 days or are getting worse.  You develop new symptoms.  You want to retest yourself or your child with a food or drink you think causes an allergic reaction. Never do this if an anaphylactic reaction to that food or drink has happened before. Only do this under the care of a caregiver. SEEK IMMEDIATE MEDICAL CARE IF:   You have difficulty breathing, are wheezing, or have a tight feeling in your chest or throat.  You have a swollen mouth, or you have hives, swelling, or itching all over your body.  You have had a severe reaction that has responded to your anaphylaxis kit or an EpiPen. These reactions may return when the medicine has worn off. These reactions should be considered life threatening. MAKE SURE YOU:   Understand these instructions.  Will watch your condition.  Will get help right away if you are not doing well or get worse. Document Released: 10/27/2002 Document Revised: 11/28/2012 Document Reviewed: 04/02/2008 [facility] Patient Information 2015 [place], [place]. This information is not intended to replace advice given to you by your health care provider. Make sure you discuss any questions you have with your health care provider.

## 2015-02-28 NOTE — ED Notes (Signed)
Patient is alert and orientedx4.  Patient was explained discharge instructions and they understood them with no questions.  The patient's wife, Candelaria Stagers is taking the patient home.

## 2015-02-28 NOTE — ED Notes (Signed)
Pt. Presents with complaint of new onset dizzness/nausea starting this AM. Pt. With hx of TIA 1 month ago. Pt. With welp-like rash all over since Tuesday. No changes to medication. EMS noted possible R sided facial droop, no previous defecits from TIA. Wife states facial droop is norm for pt.

## 2015-03-11 DIAGNOSIS — I10 Essential (primary) hypertension: Secondary | ICD-10-CM | POA: Diagnosis not present

## 2015-03-11 DIAGNOSIS — I638 Other cerebral infarction: Secondary | ICD-10-CM | POA: Diagnosis not present

## 2015-03-11 DIAGNOSIS — Z6831 Body mass index (BMI) 31.0-31.9, adult: Secondary | ICD-10-CM | POA: Diagnosis not present

## 2015-03-11 DIAGNOSIS — R319 Hematuria, unspecified: Secondary | ICD-10-CM | POA: Diagnosis not present

## 2015-03-11 DIAGNOSIS — N390 Urinary tract infection, site not specified: Secondary | ICD-10-CM | POA: Diagnosis not present

## 2015-03-26 DIAGNOSIS — M5136 Other intervertebral disc degeneration, lumbar region: Secondary | ICD-10-CM | POA: Diagnosis not present

## 2015-03-26 DIAGNOSIS — M9915 Subluxation complex (vertebral) of pelvic region: Secondary | ICD-10-CM | POA: Diagnosis not present

## 2015-03-26 DIAGNOSIS — M9913 Subluxation complex (vertebral) of lumbar region: Secondary | ICD-10-CM | POA: Diagnosis not present

## 2015-03-26 DIAGNOSIS — M9914 Subluxation complex (vertebral) of sacral region: Secondary | ICD-10-CM | POA: Diagnosis not present

## 2015-03-28 DIAGNOSIS — R413 Other amnesia: Secondary | ICD-10-CM | POA: Diagnosis not present

## 2015-03-28 DIAGNOSIS — Z6832 Body mass index (BMI) 32.0-32.9, adult: Secondary | ICD-10-CM | POA: Diagnosis not present

## 2015-04-17 ENCOUNTER — Encounter: Payer: Self-pay | Admitting: Neurology

## 2015-04-17 ENCOUNTER — Ambulatory Visit (INDEPENDENT_AMBULATORY_CARE_PROVIDER_SITE_OTHER): Payer: Medicare Other | Admitting: Neurology

## 2015-04-17 VITALS — BP 160/84 | HR 62 | Ht 69.0 in | Wt 207.6 lb

## 2015-04-17 DIAGNOSIS — I6381 Other cerebral infarction due to occlusion or stenosis of small artery: Secondary | ICD-10-CM

## 2015-04-17 DIAGNOSIS — I639 Cerebral infarction, unspecified: Secondary | ICD-10-CM | POA: Diagnosis not present

## 2015-04-17 NOTE — Patient Instructions (Signed)
I had a long d/w patient and his wife about his recent stroke, risk for recurrent stroke/TIAs, personally independently reviewed imaging studies and stroke evaluation results and answered questions.Continue aspirin 81 mg orally every day  for secondary stroke prevention and maintain strict control of hypertension with blood pressure goal below 130/90, diabetes with hemoglobin A1c goal below 6.5% and lipids with LDL cholesterol goal below 100 mg/dL. I also advised the patient to eat a healthy diet with plenty of whole grains, cereals, fruits and vegetables, exercise regularly and maintain ideal body weight .Continue Gallantamine for his mild cognitive impairment which he seems to be tolerating and has noticed some improvement. Greater than 50% of time during this 25 minute visit was spent on counseling and coordination of care. Followup in the future with me in 6 months or call earlier if necessary. Stroke Prevention Some medical conditions and behaviors are associated with an increased chance of having a stroke. You may prevent a stroke by making healthy choices and managing medical conditions. HOW CAN I REDUCE MY RISK OF HAVING A STROKE?   Stay physically active. Get at least 30 minutes of activity on most or all days.  Do not smoke. It may also be helpful to avoid exposure to secondhand smoke.  Limit alcohol use. Moderate alcohol use is considered to be:  No more than 2 drinks per day for men.  No more than 1 drink per day for nonpregnant women.  Eat healthy foods. This involves:  Eating 5 or more servings of fruits and vegetables a day.  Making dietary changes that address high blood pressure (hypertension), high cholesterol, diabetes, or obesity.  Manage your cholesterol levels.  Making food choices that are high in fiber and low in saturated fat, trans fat, and cholesterol may control cholesterol levels.  Take any prescribed medicines to control cholesterol as directed by your health  care provider.  Manage your diabetes.  Controlling your carbohydrate and sugar intake is recommended to manage diabetes.  Take any prescribed medicines to control diabetes as directed by your health care provider.  Control your hypertension.  Making food choices that are low in salt (sodium), saturated fat, trans fat, and cholesterol is recommended to manage hypertension.  Take any prescribed medicines to control hypertension as directed by your health care provider.  Maintain a healthy weight.  Reducing calorie intake and making food choices that are low in sodium, saturated fat, trans fat, and cholesterol are recommended to manage weight.  Stop drug abuse.  Avoid taking birth control pills.  Talk to your health care provider about the risks of taking birth control pills if you are over 2 years old, smoke, get migraines, or have ever had a blood clot.  Get evaluated for sleep disorders (sleep apnea).  Talk to your health care provider about getting a sleep evaluation if you snore a lot or have excessive sleepiness.  Take medicines only as directed by your health care provider.  For some people, aspirin or blood thinners (anticoagulants) are helpful in reducing the risk of forming abnormal blood clots that can lead to stroke. If you have the irregular heart rhythm of atrial fibrillation, you should be on a blood thinner unless there is a good reason you cannot take them.  Understand all your medicine instructions.  Make sure that other conditions (such as anemia or atherosclerosis) are addressed. SEEK IMMEDIATE MEDICAL CARE IF:  1. You have sudden weakness or numbness of the face, arm, or leg, especially on one side  of the body. 2. Your face or eyelid droops to one side. 3. You have sudden confusion. 4. You have trouble speaking (aphasia) or understanding. 5. You have sudden trouble seeing in one or both eyes. 6. You have sudden trouble walking. 7. You have  dizziness. 8. You have a loss of balance or coordination. 9. You have a sudden, severe headache with no known cause. 10. You have new chest pain or an irregular heartbeat. Any of these symptoms may represent a serious problem that is an emergency. Do not wait to see if the symptoms will go away. Get medical help at once. Call your local emergency services (911 in [place] Do not drive yourself to the hospital. Document Released: 09/10/2004 Document Revised: 12/18/2013 Document Reviewed: 02/03/2013 [facility] Patient Information 2015 [place], [place]. This information is not intended to replace advice given to you by your health care provider. Make sure you discuss any questions you have with your health care provider. Memory Compensation Strategies  11. Use "WARM" strategy.  W= write it down  A= associate it  R= repeat it  M= make a mental note  2.   You can keep a Social worker.  Use a 3-ring notebook with sections for the following: calendar, important names and phone numbers,  medications, doctors' names/phone numbers, lists/reminders, and a section to journal what you did  each day.   3.    Use a calendar to write appointments down.  4.    Write yourself a schedule for the day.  This can be placed on the calendar or in a separate section of the Memory Notebook.  Keeping a  regular schedule can help memory.  5.    Use medication organizer with sections for each day or morning/evening pills.  You may need help loading it  6.    Keep a basket, or pegboard by the door.  Place items that you need to take out with you in the basket or on the pegboard.  You may also want to  include a message board for reminders.  7.    Use sticky notes.  Place sticky notes with reminders in a place where the task is performed.  For example: " turn off the  stove" placed by the stove, "lock the door" placed on the door at eye level, " take your medications" on  the bathroom mirror or by the place where you  normally take your medications.  8.    Use alarms/timers.  Use while cooking to remind yourself to check on food or as a reminder to take your medicine, or as a  reminder to make a call, or as a reminder to perform another task, etc.

## 2015-04-18 NOTE — Progress Notes (Signed)
[facility] [address] [place]. [place] [address] [phone]       OFFICE FOLLOW-UP NOTE  Mr. Tyrece Vanterpool Date of Birth:  06-26-1929 Medical Record Number:  [id number]   HPI: Mr Clinard is a 81 year male seen today for first office follow-up visit following hospital hospital admission for stroke on 02/12/15.Patient is a 81 year old male with hypertension, hyperlipidemia, recent TIA had outpatient workup with Dr. Joylene Draft. Per patient's wife, he had similar symptoms at that time. [name] reported that he was in his normal state of health until yesterday. This morning he woke up and appeared to be somewhat confused. Per patient's wife, he did not remember what he ate at breakfast, which TV channel he was watching. Patient feels better at the time of the examination. Otherwise he denied any numbness, tingling, blurred vision, headache, focal weakness. [name] reported that they were told by the PCP to bring him to the ER if he has any neurological symptoms.  CT head negative for acute stroke, UA negative for UTI  MRI scan of the brain showed a left posterior limb internal capsule infarct. MRA showed no large vessel stenosis. Carotid ultrasound done as an outpatient and Dr. Silvestre Mesi office showed bilateral mild atherosclerosis with less than 50% stenosis. Transthoracic echo done recently showed ejection fraction of 60% without cardiac source of embolism. Hemoglobin A1c was 5.1. Lipid profile showed total cholesterol 133, triglycerides 127, HDL 32 and LDL 76 mg percent. Patient was started on aspirin. After initial been started on Plavix which relate to a bland rash and itching as well as hematuria. Patient had trouble passing urine but that now seems to improve. He also had confusion and forgetfulness and decreased memory after his stroke and Dr. Joylene Draft has recently started the patient on galantamine for last 2 weeks which he seems to be tolerating well without significant  GI or new side effects. ROS:   14 system review of systems is positive for hearing loss, ringing in the ears, skin moles, easy bleeding, allergies, joint pain, aching muscles, memory loss, decreased energy, anxiety, disinterest in activities, racing thoughts, restless legs and all other systems negative  PMH:  Past Medical History  Diagnosis Date  . Hypertension   . Prostate cancer 2008  . Hyperlipidemia   . Hepatitis B   . Stroke     Social History:  Social History   Social History  . Marital Status: Single    Spouse Name: N/A  . Number of Children: 2  . Years of Education: N/A   Occupational History  . retired    Social History Main Topics  . Smoking status: Former Smoker    Types: Cigarettes    Quit date: 08/17/1998  . Smokeless tobacco: Current User    Types: Chew  . Alcohol Use: Yes     Comment: rarely-1-2 beers a year  . Drug Use: No  . Sexual Activity: Not on file   Other Topics Concern  . Not on file   Social History Narrative    Medications:   Current Outpatient Prescriptions on File Prior to Visit  Medication Sig Dispense Refill  . alendronate (FOSAMAX) 70 MG tablet Take 70 mg by mouth once a week. Sunday    . aspirin 81 MG chewable tablet Chew 4 tablets (324 mg total) by mouth daily. 30 tablet 0  . Coenzyme Q10 (COQ10 PO) Take 1 tablet by mouth daily.    . felodipine (PLENDIL) 10 MG 24 hr tablet Take 10  mg by mouth daily.    . finasteride (PROSCAR) 5 MG tablet Take 1 tablet by mouth daily.    . Fish Oil-Cholecalciferol (OMEGA-3 FISH OIL/VITAMIN D3) 1000-1000 MG-UNIT CAPS Take 1 tablet by mouth daily.    Marland Kitchen GARLIC PO Take 1 tablet by mouth daily.    . Multiple Vitamin (MULTIVITAMIN) tablet Take 1 tablet by mouth daily.    . Multiple Vitamins-Minerals (CENTRUM SILVER) tablet Take 1 tablet by mouth daily.    . niacin 500 MG tablet Take 500 mg by mouth at bedtime.    . Potassium Aminobenzoate 500 MG TABS Take 1 tablet by mouth daily.    . Probiotic  Product (ALIGN) 4 MG CAPS Take 1 capsule by mouth daily.    . simvastatin (ZOCOR) 10 MG tablet Take 10 mg by mouth daily.    . tamsulosin (FLOMAX) 0.4 MG CAPS capsule Take 1 capsule by mouth daily as needed.    . valsartan (DIOVAN) 160 MG tablet Take 160 mg by mouth daily as needed (Takes if BP too high).      No current facility-administered medications on file prior to visit.    Allergies:  No Known Allergies  Physical Exam General: well developed, well nourished elderly male, seated, in no evident distress Head: head normocephalic and atraumatic.  Neck: supple with no carotid or supraclavicular bruits Cardiovascular: regular rate and rhythm, no murmurs Musculoskeletal: no deformity Skin:  no rash/petichiae Vascular:  Normal pulses all extremities Filed Vitals:   04/17/15 0934  BP: 160/84  Pulse: 62   Neurologic Exam Mental Status: Awake and fully alert. Oriented to place and time. Recent and remote memory intact. Attention span, concentration and fund of knowledge appropriate. Mood and affect appropriate.  Cranial Nerves: Fundoscopic exam reveals sharp disc margins. Pupils equal, briskly reactive to light. Extraocular movements full without nystagmus. Visual fields full to confrontation. Hearing intact. Facial sensation intact. Face, tongue, palate moves normally and symmetrically.  Motor: Normal bulk and tone. Normal strength in all tested extremity muscles. Diminished fine finger movements on the right. Orbits left over right upper extremity. Sensory.: intact to touch ,pinprick .position and vibratory sensation.  Coordination: Rapid alternating movements normal in all extremities. Finger-to-nose and heel-to-shin performed accurately bilaterally. Gait and Station: Arises from chair without difficulty. Stance is normal. Gait demonstrates normal stride length and balance . Able to heel, toe and tandem walk without difficulty.  Reflexes: 1+ and symmetric. Toes downgoing.       ASSESSMENT: 81 year male with left posterior limb internal capsule infarct in June 2016 from small vessel disease. Vascular risk factors of hypertension hyperlipidemia and age and sex.    PLAN: I had a long d/w patient and his wife about his recent stroke, risk for recurrent stroke/TIAs, personally independently reviewed imaging studies and stroke evaluation results and answered questions.Continue aspirin 81 mg orally every day  for secondary stroke prevention and maintain strict control of hypertension with blood pressure goal below 130/90, diabetes with hemoglobin A1c goal below 6.5% and lipids with LDL cholesterol goal below 100 mg/dL. I also advised the patient to eat a healthy diet with plenty of whole grains, cereals, fruits and vegetables, exercise regularly and maintain ideal body weight .Continue Gallantamine for his mild cognitive impairment which he seems to be tolerating and has noticed some improvement. Greater than 50% of time during this 25 minute visit was spent on counseling and coordination of care. Followup in the future with me in 6 months or call earlier if necessary.  Antony Contras, MD Note: This document was prepared with digital dictation and possible smart phrase technology. Any transcriptional errors that result from this process are unintentional

## 2015-04-23 DIAGNOSIS — M9913 Subluxation complex (vertebral) of lumbar region: Secondary | ICD-10-CM | POA: Diagnosis not present

## 2015-04-23 DIAGNOSIS — M9914 Subluxation complex (vertebral) of sacral region: Secondary | ICD-10-CM | POA: Diagnosis not present

## 2015-04-23 DIAGNOSIS — M9915 Subluxation complex (vertebral) of pelvic region: Secondary | ICD-10-CM | POA: Diagnosis not present

## 2015-04-23 DIAGNOSIS — M5136 Other intervertebral disc degeneration, lumbar region: Secondary | ICD-10-CM | POA: Diagnosis not present

## 2015-05-21 DIAGNOSIS — M9915 Subluxation complex (vertebral) of pelvic region: Secondary | ICD-10-CM | POA: Diagnosis not present

## 2015-05-21 DIAGNOSIS — M9914 Subluxation complex (vertebral) of sacral region: Secondary | ICD-10-CM | POA: Diagnosis not present

## 2015-05-21 DIAGNOSIS — M9913 Subluxation complex (vertebral) of lumbar region: Secondary | ICD-10-CM | POA: Diagnosis not present

## 2015-05-21 DIAGNOSIS — M5136 Other intervertebral disc degeneration, lumbar region: Secondary | ICD-10-CM | POA: Diagnosis not present

## 2015-06-17 DIAGNOSIS — M9914 Subluxation complex (vertebral) of sacral region: Secondary | ICD-10-CM | POA: Diagnosis not present

## 2015-06-17 DIAGNOSIS — M5136 Other intervertebral disc degeneration, lumbar region: Secondary | ICD-10-CM | POA: Diagnosis not present

## 2015-06-17 DIAGNOSIS — M9913 Subluxation complex (vertebral) of lumbar region: Secondary | ICD-10-CM | POA: Diagnosis not present

## 2015-06-17 DIAGNOSIS — M9915 Subluxation complex (vertebral) of pelvic region: Secondary | ICD-10-CM | POA: Diagnosis not present

## 2015-06-18 DIAGNOSIS — Z23 Encounter for immunization: Secondary | ICD-10-CM | POA: Diagnosis not present

## 2015-08-29 DIAGNOSIS — M9915 Subluxation complex (vertebral) of pelvic region: Secondary | ICD-10-CM | POA: Diagnosis not present

## 2015-08-29 DIAGNOSIS — M9914 Subluxation complex (vertebral) of sacral region: Secondary | ICD-10-CM | POA: Diagnosis not present

## 2015-08-29 DIAGNOSIS — M9913 Subluxation complex (vertebral) of lumbar region: Secondary | ICD-10-CM | POA: Diagnosis not present

## 2015-08-29 DIAGNOSIS — M5136 Other intervertebral disc degeneration, lumbar region: Secondary | ICD-10-CM | POA: Diagnosis not present

## 2015-08-29 DIAGNOSIS — M9903 Segmental and somatic dysfunction of lumbar region: Secondary | ICD-10-CM | POA: Diagnosis not present

## 2015-09-03 DIAGNOSIS — M5136 Other intervertebral disc degeneration, lumbar region: Secondary | ICD-10-CM | POA: Diagnosis not present

## 2015-09-03 DIAGNOSIS — M9915 Subluxation complex (vertebral) of pelvic region: Secondary | ICD-10-CM | POA: Diagnosis not present

## 2015-09-03 DIAGNOSIS — M9914 Subluxation complex (vertebral) of sacral region: Secondary | ICD-10-CM | POA: Diagnosis not present

## 2015-09-03 DIAGNOSIS — M9903 Segmental and somatic dysfunction of lumbar region: Secondary | ICD-10-CM | POA: Diagnosis not present

## 2015-09-03 DIAGNOSIS — M9913 Subluxation complex (vertebral) of lumbar region: Secondary | ICD-10-CM | POA: Diagnosis not present

## 2015-09-05 DIAGNOSIS — M9903 Segmental and somatic dysfunction of lumbar region: Secondary | ICD-10-CM | POA: Diagnosis not present

## 2015-09-05 DIAGNOSIS — M9913 Subluxation complex (vertebral) of lumbar region: Secondary | ICD-10-CM | POA: Diagnosis not present

## 2015-09-05 DIAGNOSIS — M9915 Subluxation complex (vertebral) of pelvic region: Secondary | ICD-10-CM | POA: Diagnosis not present

## 2015-09-05 DIAGNOSIS — M5136 Other intervertebral disc degeneration, lumbar region: Secondary | ICD-10-CM | POA: Diagnosis not present

## 2015-09-05 DIAGNOSIS — M9914 Subluxation complex (vertebral) of sacral region: Secondary | ICD-10-CM | POA: Diagnosis not present

## 2015-09-10 DIAGNOSIS — M9915 Subluxation complex (vertebral) of pelvic region: Secondary | ICD-10-CM | POA: Diagnosis not present

## 2015-09-10 DIAGNOSIS — M9914 Subluxation complex (vertebral) of sacral region: Secondary | ICD-10-CM | POA: Diagnosis not present

## 2015-09-10 DIAGNOSIS — M5136 Other intervertebral disc degeneration, lumbar region: Secondary | ICD-10-CM | POA: Diagnosis not present

## 2015-09-10 DIAGNOSIS — M9913 Subluxation complex (vertebral) of lumbar region: Secondary | ICD-10-CM | POA: Diagnosis not present

## 2015-09-10 DIAGNOSIS — M9903 Segmental and somatic dysfunction of lumbar region: Secondary | ICD-10-CM | POA: Diagnosis not present

## 2015-09-12 DIAGNOSIS — M9914 Subluxation complex (vertebral) of sacral region: Secondary | ICD-10-CM | POA: Diagnosis not present

## 2015-09-12 DIAGNOSIS — M9915 Subluxation complex (vertebral) of pelvic region: Secondary | ICD-10-CM | POA: Diagnosis not present

## 2015-09-12 DIAGNOSIS — M9903 Segmental and somatic dysfunction of lumbar region: Secondary | ICD-10-CM | POA: Diagnosis not present

## 2015-09-12 DIAGNOSIS — M9913 Subluxation complex (vertebral) of lumbar region: Secondary | ICD-10-CM | POA: Diagnosis not present

## 2015-09-12 DIAGNOSIS — M5136 Other intervertebral disc degeneration, lumbar region: Secondary | ICD-10-CM | POA: Diagnosis not present

## 2015-09-18 DIAGNOSIS — M4727 Other spondylosis with radiculopathy, lumbosacral region: Secondary | ICD-10-CM | POA: Diagnosis not present

## 2015-09-18 DIAGNOSIS — M4316 Spondylolisthesis, lumbar region: Secondary | ICD-10-CM | POA: Diagnosis not present

## 2015-09-18 DIAGNOSIS — M5116 Intervertebral disc disorders with radiculopathy, lumbar region: Secondary | ICD-10-CM | POA: Diagnosis not present

## 2015-09-18 DIAGNOSIS — M4726 Other spondylosis with radiculopathy, lumbar region: Secondary | ICD-10-CM | POA: Diagnosis not present

## 2015-09-25 DIAGNOSIS — M5127 Other intervertebral disc displacement, lumbosacral region: Secondary | ICD-10-CM | POA: Diagnosis not present

## 2015-09-25 DIAGNOSIS — I10 Essential (primary) hypertension: Secondary | ICD-10-CM | POA: Diagnosis not present

## 2015-09-25 DIAGNOSIS — Z01812 Encounter for preprocedural laboratory examination: Secondary | ICD-10-CM | POA: Diagnosis not present

## 2015-09-25 DIAGNOSIS — Z6833 Body mass index (BMI) 33.0-33.9, adult: Secondary | ICD-10-CM | POA: Diagnosis not present

## 2015-10-04 DIAGNOSIS — M5126 Other intervertebral disc displacement, lumbar region: Secondary | ICD-10-CM | POA: Diagnosis not present

## 2015-10-04 DIAGNOSIS — M5116 Intervertebral disc disorders with radiculopathy, lumbar region: Secondary | ICD-10-CM | POA: Diagnosis not present

## 2015-10-15 ENCOUNTER — Ambulatory Visit: Payer: Medicare Other | Admitting: Neurology

## 2015-12-11 ENCOUNTER — Other Ambulatory Visit: Payer: Self-pay | Admitting: Neurosurgery

## 2015-12-11 DIAGNOSIS — M5127 Other intervertebral disc displacement, lumbosacral region: Secondary | ICD-10-CM

## 2015-12-13 ENCOUNTER — Ambulatory Visit
Admission: RE | Admit: 2015-12-13 | Discharge: 2015-12-13 | Disposition: A | Discharge status: Home / Self Care | Payer: Medicare Other | Source: Ambulatory Visit | Attending: Neurosurgery | Admitting: Neurosurgery

## 2015-12-13 DIAGNOSIS — M47817 Spondylosis without myelopathy or radiculopathy, lumbosacral region: Secondary | ICD-10-CM | POA: Diagnosis not present

## 2015-12-13 DIAGNOSIS — M5127 Other intervertebral disc displacement, lumbosacral region: Secondary | ICD-10-CM

## 2015-12-13 MED ORDER — IOPAMIDOL (ISOVUE-M 200) INJECTION 41%
1.0000 mL | Freq: Once | INTRAMUSCULAR | Status: AC
  Administered 2015-12-13: 1 mL via EPIDURAL

## 2015-12-13 MED ORDER — METHYLPREDNISOLONE ACETATE 40 MG/ML INJ SUSP (RADIOLOG
120.0000 mg | Freq: Once | INTRAMUSCULAR | Status: AC
  Administered 2015-12-13: 120 mg via EPIDURAL

## 2015-12-13 NOTE — Discharge Instructions (Signed)

## 2015-12-27 ENCOUNTER — Other Ambulatory Visit: Payer: Self-pay | Admitting: Neurosurgery

## 2015-12-27 DIAGNOSIS — M5127 Other intervertebral disc displacement, lumbosacral region: Secondary | ICD-10-CM

## 2016-01-15 ENCOUNTER — Ambulatory Visit
Admission: RE | Admit: 2016-01-15 | Discharge: 2016-01-15 | Disposition: A | Discharge status: Home / Self Care | Payer: Medicare Other | Source: Ambulatory Visit | Attending: Neurosurgery | Admitting: Neurosurgery

## 2016-01-15 DIAGNOSIS — M545 Low back pain: Secondary | ICD-10-CM | POA: Diagnosis not present

## 2016-01-15 DIAGNOSIS — M5127 Other intervertebral disc displacement, lumbosacral region: Secondary | ICD-10-CM

## 2016-01-15 MED ORDER — METHYLPREDNISOLONE ACETATE 40 MG/ML INJ SUSP (RADIOLOG
120.0000 mg | Freq: Once | INTRAMUSCULAR | Status: AC
  Administered 2016-01-15: 120 mg via EPIDURAL

## 2016-01-15 MED ORDER — IOPAMIDOL (ISOVUE-M 200) INJECTION 41%
1.0000 mL | Freq: Once | INTRAMUSCULAR | Status: AC
  Administered 2016-01-15: 1 mL via EPIDURAL

## 2016-02-10 ENCOUNTER — Other Ambulatory Visit: Payer: Self-pay | Admitting: Neurosurgery

## 2016-02-10 DIAGNOSIS — M5127 Other intervertebral disc displacement, lumbosacral region: Secondary | ICD-10-CM

## 2016-02-12 ENCOUNTER — Other Ambulatory Visit: Payer: Self-pay | Admitting: Neurosurgery

## 2016-02-14 ENCOUNTER — Encounter: Payer: Self-pay | Admitting: Radiology

## 2016-02-14 ENCOUNTER — Ambulatory Visit
Admission: RE | Admit: 2016-02-14 | Discharge: 2016-02-14 | Disposition: A | Discharge status: Home / Self Care | Payer: Medicare Other | Source: Ambulatory Visit | Attending: Neurosurgery | Admitting: Neurosurgery

## 2016-02-14 ENCOUNTER — Other Ambulatory Visit: Payer: Self-pay | Admitting: Neurosurgery

## 2016-02-14 DIAGNOSIS — M5127 Other intervertebral disc displacement, lumbosacral region: Secondary | ICD-10-CM

## 2016-02-14 DIAGNOSIS — M47817 Spondylosis without myelopathy or radiculopathy, lumbosacral region: Secondary | ICD-10-CM | POA: Diagnosis not present

## 2016-02-14 MED ORDER — IOPAMIDOL (ISOVUE-M 200) INJECTION 41%
1.0000 mL | Freq: Once | INTRAMUSCULAR | Status: AC
  Administered 2016-02-14: 1 mL via EPIDURAL

## 2016-02-14 MED ORDER — METHYLPREDNISOLONE ACETATE 40 MG/ML INJ SUSP (RADIOLOG
120.0000 mg | Freq: Once | INTRAMUSCULAR | Status: AC
  Administered 2016-02-14: 120 mg via EPIDURAL

## 2016-05-22 DIAGNOSIS — I10 Essential (primary) hypertension: Secondary | ICD-10-CM | POA: Diagnosis not present

## 2016-05-22 DIAGNOSIS — M859 Disorder of bone density and structure, unspecified: Secondary | ICD-10-CM | POA: Diagnosis not present

## 2016-05-22 DIAGNOSIS — E538 Deficiency of other specified B group vitamins: Secondary | ICD-10-CM | POA: Diagnosis not present

## 2016-05-22 DIAGNOSIS — D519 Vitamin B12 deficiency anemia, unspecified: Secondary | ICD-10-CM | POA: Diagnosis not present

## 2016-05-22 DIAGNOSIS — Z125 Encounter for screening for malignant neoplasm of prostate: Secondary | ICD-10-CM | POA: Diagnosis not present

## 2016-05-22 DIAGNOSIS — N390 Urinary tract infection, site not specified: Secondary | ICD-10-CM | POA: Diagnosis not present

## 2016-05-22 DIAGNOSIS — E784 Other hyperlipidemia: Secondary | ICD-10-CM | POA: Diagnosis not present

## 2016-05-22 DIAGNOSIS — R7301 Impaired fasting glucose: Secondary | ICD-10-CM | POA: Diagnosis not present

## 2016-05-22 DIAGNOSIS — R8299 Other abnormal findings in urine: Secondary | ICD-10-CM | POA: Diagnosis not present

## 2016-05-22 DIAGNOSIS — Z23 Encounter for immunization: Secondary | ICD-10-CM | POA: Diagnosis not present

## 2016-05-29 DIAGNOSIS — C61 Malignant neoplasm of prostate: Secondary | ICD-10-CM | POA: Diagnosis not present

## 2016-05-29 DIAGNOSIS — E784 Other hyperlipidemia: Secondary | ICD-10-CM | POA: Diagnosis not present

## 2016-05-29 DIAGNOSIS — E538 Deficiency of other specified B group vitamins: Secondary | ICD-10-CM | POA: Diagnosis not present

## 2016-05-29 DIAGNOSIS — Z6835 Body mass index (BMI) 35.0-35.9, adult: Secondary | ICD-10-CM | POA: Diagnosis not present

## 2016-05-29 DIAGNOSIS — Z0000 Encounter for general adult medical examination without abnormal findings: Secondary | ICD-10-CM | POA: Diagnosis not present

## 2016-05-29 DIAGNOSIS — I638 Other cerebral infarction: Secondary | ICD-10-CM | POA: Diagnosis not present

## 2016-05-29 DIAGNOSIS — I10 Essential (primary) hypertension: Secondary | ICD-10-CM | POA: Diagnosis not present

## 2016-05-29 DIAGNOSIS — M545 Low back pain: Secondary | ICD-10-CM | POA: Diagnosis not present

## 2016-05-29 DIAGNOSIS — M1990 Unspecified osteoarthritis, unspecified site: Secondary | ICD-10-CM | POA: Diagnosis not present

## 2016-05-29 DIAGNOSIS — R413 Other amnesia: Secondary | ICD-10-CM | POA: Diagnosis not present

## 2016-05-29 DIAGNOSIS — Z1389 Encounter for screening for other disorder: Secondary | ICD-10-CM | POA: Diagnosis not present

## 2016-05-29 DIAGNOSIS — G458 Other transient cerebral ischemic attacks and related syndromes: Secondary | ICD-10-CM | POA: Diagnosis not present

## 2016-07-03 DIAGNOSIS — Z6835 Body mass index (BMI) 35.0-35.9, adult: Secondary | ICD-10-CM | POA: Diagnosis not present

## 2016-07-03 DIAGNOSIS — M545 Low back pain: Secondary | ICD-10-CM | POA: Diagnosis not present

## 2016-08-06 DIAGNOSIS — I10 Essential (primary) hypertension: Secondary | ICD-10-CM | POA: Diagnosis not present

## 2016-08-06 DIAGNOSIS — M545 Low back pain: Secondary | ICD-10-CM | POA: Diagnosis not present

## 2016-09-23 DIAGNOSIS — M5127 Other intervertebral disc displacement, lumbosacral region: Secondary | ICD-10-CM | POA: Diagnosis not present

## 2016-09-24 ENCOUNTER — Other Ambulatory Visit: Payer: Self-pay | Admitting: Neurosurgery

## 2016-09-24 DIAGNOSIS — M5127 Other intervertebral disc displacement, lumbosacral region: Secondary | ICD-10-CM

## 2016-09-30 ENCOUNTER — Ambulatory Visit
Admission: RE | Admit: 2016-09-30 | Discharge: 2016-09-30 | Disposition: A | Discharge status: Home / Self Care | Payer: Medicare Other | Source: Ambulatory Visit | Attending: Neurosurgery | Admitting: Neurosurgery

## 2016-09-30 DIAGNOSIS — M5127 Other intervertebral disc displacement, lumbosacral region: Secondary | ICD-10-CM

## 2016-09-30 DIAGNOSIS — M48061 Spinal stenosis, lumbar region without neurogenic claudication: Secondary | ICD-10-CM | POA: Diagnosis not present

## 2016-09-30 MED ORDER — IOPAMIDOL (ISOVUE-M 200) INJECTION 41%
15.0000 mL | Freq: Once | INTRAMUSCULAR | Status: AC
  Administered 2016-09-30: 15 mL via INTRATHECAL

## 2016-09-30 NOTE — Discharge Instructions (Signed)

## 2016-10-02 ENCOUNTER — Telehealth: Payer: Self-pay

## 2016-10-02 NOTE — Telephone Encounter (Signed)
Spoke with patient after he had a myelogram here 09/30/16.  He states he is doing fine and had no problems with the bedrest.  jkl

## 2016-10-07 DIAGNOSIS — M5127 Other intervertebral disc displacement, lumbosacral region: Secondary | ICD-10-CM | POA: Diagnosis not present

## 2016-11-03 DIAGNOSIS — M47816 Spondylosis without myelopathy or radiculopathy, lumbar region: Secondary | ICD-10-CM | POA: Diagnosis not present

## 2016-11-03 DIAGNOSIS — M5416 Radiculopathy, lumbar region: Secondary | ICD-10-CM | POA: Diagnosis not present

## 2016-11-03 DIAGNOSIS — M5126 Other intervertebral disc displacement, lumbar region: Secondary | ICD-10-CM | POA: Diagnosis not present

## 2016-11-03 DIAGNOSIS — M48062 Spinal stenosis, lumbar region with neurogenic claudication: Secondary | ICD-10-CM | POA: Diagnosis not present

## 2016-11-10 DIAGNOSIS — M48062 Spinal stenosis, lumbar region with neurogenic claudication: Secondary | ICD-10-CM | POA: Diagnosis not present

## 2016-11-10 DIAGNOSIS — M5416 Radiculopathy, lumbar region: Secondary | ICD-10-CM | POA: Diagnosis not present

## 2016-11-10 DIAGNOSIS — M5126 Other intervertebral disc displacement, lumbar region: Secondary | ICD-10-CM | POA: Diagnosis not present

## 2016-12-01 DIAGNOSIS — M47816 Spondylosis without myelopathy or radiculopathy, lumbar region: Secondary | ICD-10-CM | POA: Diagnosis not present

## 2016-12-01 DIAGNOSIS — I10 Essential (primary) hypertension: Secondary | ICD-10-CM | POA: Diagnosis not present

## 2016-12-01 DIAGNOSIS — Z6835 Body mass index (BMI) 35.0-35.9, adult: Secondary | ICD-10-CM | POA: Diagnosis not present

## 2017-04-02 DIAGNOSIS — M5416 Radiculopathy, lumbar region: Secondary | ICD-10-CM | POA: Diagnosis not present

## 2017-04-02 DIAGNOSIS — G8929 Other chronic pain: Secondary | ICD-10-CM | POA: Diagnosis not present

## 2017-04-02 DIAGNOSIS — M4310 Spondylolisthesis, site unspecified: Secondary | ICD-10-CM | POA: Diagnosis not present

## 2017-04-02 DIAGNOSIS — Z6836 Body mass index (BMI) 36.0-36.9, adult: Secondary | ICD-10-CM | POA: Diagnosis not present

## 2017-05-03 DIAGNOSIS — H9193 Unspecified hearing loss, bilateral: Secondary | ICD-10-CM | POA: Diagnosis not present

## 2017-05-03 DIAGNOSIS — M5416 Radiculopathy, lumbar region: Secondary | ICD-10-CM | POA: Diagnosis not present

## 2017-05-03 DIAGNOSIS — Z6835 Body mass index (BMI) 35.0-35.9, adult: Secondary | ICD-10-CM | POA: Diagnosis not present

## 2017-05-03 DIAGNOSIS — G8929 Other chronic pain: Secondary | ICD-10-CM | POA: Diagnosis not present

## 2017-05-03 DIAGNOSIS — M4310 Spondylolisthesis, site unspecified: Secondary | ICD-10-CM | POA: Diagnosis not present

## 2017-05-03 DIAGNOSIS — Z23 Encounter for immunization: Secondary | ICD-10-CM | POA: Diagnosis not present

## 2017-05-03 DIAGNOSIS — H6123 Impacted cerumen, bilateral: Secondary | ICD-10-CM | POA: Diagnosis not present

## 2017-06-09 DIAGNOSIS — Z6834 Body mass index (BMI) 34.0-34.9, adult: Secondary | ICD-10-CM | POA: Diagnosis not present

## 2017-06-09 DIAGNOSIS — M5416 Radiculopathy, lumbar region: Secondary | ICD-10-CM | POA: Diagnosis not present

## 2017-06-09 DIAGNOSIS — I8310 Varicose veins of unspecified lower extremity with inflammation: Secondary | ICD-10-CM | POA: Diagnosis not present

## 2017-06-09 DIAGNOSIS — I7389 Other specified peripheral vascular diseases: Secondary | ICD-10-CM | POA: Diagnosis not present

## 2017-06-09 DIAGNOSIS — G8929 Other chronic pain: Secondary | ICD-10-CM | POA: Diagnosis not present

## 2017-06-09 DIAGNOSIS — M4310 Spondylolisthesis, site unspecified: Secondary | ICD-10-CM | POA: Diagnosis not present

## 2017-09-13 DIAGNOSIS — M7500 Adhesive capsulitis of unspecified shoulder: Secondary | ICD-10-CM | POA: Diagnosis not present

## 2017-09-13 DIAGNOSIS — Z79891 Long term (current) use of opiate analgesic: Secondary | ICD-10-CM | POA: Diagnosis not present

## 2017-09-13 DIAGNOSIS — M47816 Spondylosis without myelopathy or radiculopathy, lumbar region: Secondary | ICD-10-CM | POA: Diagnosis not present

## 2017-09-13 DIAGNOSIS — G894 Chronic pain syndrome: Secondary | ICD-10-CM | POA: Diagnosis not present

## 2017-09-13 DIAGNOSIS — M48062 Spinal stenosis, lumbar region with neurogenic claudication: Secondary | ICD-10-CM | POA: Diagnosis not present

## 2017-10-04 DIAGNOSIS — G894 Chronic pain syndrome: Secondary | ICD-10-CM | POA: Diagnosis not present

## 2017-10-04 DIAGNOSIS — M7500 Adhesive capsulitis of unspecified shoulder: Secondary | ICD-10-CM | POA: Diagnosis not present

## 2017-10-04 DIAGNOSIS — M47816 Spondylosis without myelopathy or radiculopathy, lumbar region: Secondary | ICD-10-CM | POA: Diagnosis not present

## 2017-10-04 DIAGNOSIS — M48062 Spinal stenosis, lumbar region with neurogenic claudication: Secondary | ICD-10-CM | POA: Diagnosis not present

## 2017-10-22 DIAGNOSIS — M7501 Adhesive capsulitis of right shoulder: Secondary | ICD-10-CM | POA: Diagnosis not present

## 2017-11-05 DIAGNOSIS — M545 Low back pain: Secondary | ICD-10-CM | POA: Diagnosis not present

## 2017-11-19 DIAGNOSIS — G894 Chronic pain syndrome: Secondary | ICD-10-CM | POA: Diagnosis not present

## 2017-11-19 DIAGNOSIS — M48061 Spinal stenosis, lumbar region without neurogenic claudication: Secondary | ICD-10-CM | POA: Diagnosis not present

## 2017-11-19 DIAGNOSIS — M47816 Spondylosis without myelopathy or radiculopathy, lumbar region: Secondary | ICD-10-CM | POA: Diagnosis not present

## 2017-11-19 DIAGNOSIS — M7500 Adhesive capsulitis of unspecified shoulder: Secondary | ICD-10-CM | POA: Diagnosis not present

## 2017-11-24 DIAGNOSIS — M47816 Spondylosis without myelopathy or radiculopathy, lumbar region: Secondary | ICD-10-CM | POA: Diagnosis not present

## 2017-12-14 DIAGNOSIS — M47816 Spondylosis without myelopathy or radiculopathy, lumbar region: Secondary | ICD-10-CM | POA: Diagnosis not present

## 2017-12-24 DIAGNOSIS — M67912 Unspecified disorder of synovium and tendon, left shoulder: Secondary | ICD-10-CM | POA: Diagnosis not present

## 2017-12-24 DIAGNOSIS — M67911 Unspecified disorder of synovium and tendon, right shoulder: Secondary | ICD-10-CM | POA: Diagnosis not present

## 2018-01-28 DIAGNOSIS — M47816 Spondylosis without myelopathy or radiculopathy, lumbar region: Secondary | ICD-10-CM | POA: Diagnosis not present

## 2018-03-28 DIAGNOSIS — Z6833 Body mass index (BMI) 33.0-33.9, adult: Secondary | ICD-10-CM | POA: Diagnosis not present

## 2018-03-28 DIAGNOSIS — I10 Essential (primary) hypertension: Secondary | ICD-10-CM | POA: Diagnosis not present

## 2018-03-28 DIAGNOSIS — M545 Low back pain: Secondary | ICD-10-CM | POA: Diagnosis not present

## 2018-03-28 DIAGNOSIS — I8310 Varicose veins of unspecified lower extremity with inflammation: Secondary | ICD-10-CM | POA: Diagnosis not present

## 2018-04-11 DIAGNOSIS — I10 Essential (primary) hypertension: Secondary | ICD-10-CM | POA: Diagnosis not present

## 2018-04-11 DIAGNOSIS — I7389 Other specified peripheral vascular diseases: Secondary | ICD-10-CM | POA: Diagnosis not present

## 2018-04-11 DIAGNOSIS — Z6831 Body mass index (BMI) 31.0-31.9, adult: Secondary | ICD-10-CM | POA: Diagnosis not present

## 2018-04-11 DIAGNOSIS — I8310 Varicose veins of unspecified lower extremity with inflammation: Secondary | ICD-10-CM | POA: Diagnosis not present

## 2019-01-13 DIAGNOSIS — C61 Malignant neoplasm of prostate: Secondary | ICD-10-CM | POA: Diagnosis not present

## 2019-01-13 DIAGNOSIS — I739 Peripheral vascular disease, unspecified: Secondary | ICD-10-CM | POA: Diagnosis not present

## 2019-01-13 DIAGNOSIS — G8929 Other chronic pain: Secondary | ICD-10-CM | POA: Diagnosis not present

## 2019-01-13 DIAGNOSIS — I6389 Other cerebral infarction: Secondary | ICD-10-CM | POA: Diagnosis not present

## 2019-01-13 DIAGNOSIS — E538 Deficiency of other specified B group vitamins: Secondary | ICD-10-CM | POA: Diagnosis not present

## 2019-01-13 DIAGNOSIS — R413 Other amnesia: Secondary | ICD-10-CM | POA: Diagnosis not present

## 2019-01-13 DIAGNOSIS — M5416 Radiculopathy, lumbar region: Secondary | ICD-10-CM | POA: Diagnosis not present

## 2019-01-13 DIAGNOSIS — E785 Hyperlipidemia, unspecified: Secondary | ICD-10-CM | POA: Diagnosis not present

## 2019-01-13 DIAGNOSIS — G459 Transient cerebral ischemic attack, unspecified: Secondary | ICD-10-CM | POA: Diagnosis not present

## 2019-01-13 DIAGNOSIS — I8310 Varicose veins of unspecified lower extremity with inflammation: Secondary | ICD-10-CM | POA: Diagnosis not present

## 2019-04-19 DIAGNOSIS — F329 Major depressive disorder, single episode, unspecified: Secondary | ICD-10-CM | POA: Diagnosis not present

## 2019-04-19 DIAGNOSIS — I10 Essential (primary) hypertension: Secondary | ICD-10-CM | POA: Diagnosis not present

## 2019-04-19 DIAGNOSIS — R634 Abnormal weight loss: Secondary | ICD-10-CM | POA: Diagnosis not present

## 2019-04-19 DIAGNOSIS — R531 Weakness: Secondary | ICD-10-CM | POA: Diagnosis not present

## 2019-04-19 DIAGNOSIS — R413 Other amnesia: Secondary | ICD-10-CM | POA: Diagnosis not present

## 2019-04-19 DIAGNOSIS — R1110 Vomiting, unspecified: Secondary | ICD-10-CM | POA: Diagnosis not present

## 2019-04-19 DIAGNOSIS — N390 Urinary tract infection, site not specified: Secondary | ICD-10-CM | POA: Diagnosis not present

## 2019-04-21 ENCOUNTER — Other Ambulatory Visit: Payer: Self-pay | Admitting: Internal Medicine

## 2019-04-21 ENCOUNTER — Ambulatory Visit
Admission: RE | Admit: 2019-04-21 | Discharge: 2019-04-21 | Disposition: A | Discharge status: Home / Self Care | Payer: Medicare Other | Source: Ambulatory Visit | Attending: Internal Medicine | Admitting: Internal Medicine

## 2019-04-21 DIAGNOSIS — R531 Weakness: Secondary | ICD-10-CM

## 2019-04-21 DIAGNOSIS — N1339 Other hydronephrosis: Secondary | ICD-10-CM | POA: Diagnosis not present

## 2019-04-21 DIAGNOSIS — R634 Abnormal weight loss: Secondary | ICD-10-CM | POA: Diagnosis not present

## 2019-04-21 DIAGNOSIS — N390 Urinary tract infection, site not specified: Secondary | ICD-10-CM | POA: Diagnosis not present

## 2019-04-21 DIAGNOSIS — N210 Calculus in bladder: Secondary | ICD-10-CM | POA: Diagnosis not present

## 2019-04-21 DIAGNOSIS — R1111 Vomiting without nausea: Secondary | ICD-10-CM

## 2019-04-21 DIAGNOSIS — R1110 Vomiting, unspecified: Secondary | ICD-10-CM | POA: Diagnosis not present

## 2019-04-28 DIAGNOSIS — R350 Frequency of micturition: Secondary | ICD-10-CM | POA: Diagnosis not present

## 2019-04-28 DIAGNOSIS — N210 Calculus in bladder: Secondary | ICD-10-CM | POA: Diagnosis not present

## 2019-04-30 ENCOUNTER — Other Ambulatory Visit: Payer: Self-pay

## 2019-04-30 ENCOUNTER — Emergency Department (HOSPITAL_COMMUNITY): Payer: Medicare Other

## 2019-04-30 ENCOUNTER — Inpatient Hospital Stay (HOSPITAL_COMMUNITY)
Admission: EM | Admit: 2019-04-30 | Discharge: 2019-05-04 | DRG: 871 | Disposition: A | Discharge status: Home Health | Payer: Medicare Other | Attending: Internal Medicine | Admitting: Internal Medicine

## 2019-04-30 ENCOUNTER — Encounter (HOSPITAL_COMMUNITY): Payer: Self-pay

## 2019-04-30 DIAGNOSIS — R0902 Hypoxemia: Secondary | ICD-10-CM | POA: Diagnosis not present

## 2019-04-30 DIAGNOSIS — I10 Essential (primary) hypertension: Secondary | ICD-10-CM | POA: Diagnosis not present

## 2019-04-30 DIAGNOSIS — G92 Toxic encephalopathy: Secondary | ICD-10-CM | POA: Diagnosis present

## 2019-04-30 DIAGNOSIS — R410 Disorientation, unspecified: Secondary | ICD-10-CM | POA: Diagnosis not present

## 2019-04-30 DIAGNOSIS — A419 Sepsis, unspecified organism: Secondary | ICD-10-CM | POA: Diagnosis not present

## 2019-04-30 DIAGNOSIS — Z8546 Personal history of malignant neoplasm of prostate: Secondary | ICD-10-CM | POA: Diagnosis not present

## 2019-04-30 DIAGNOSIS — Z8507 Personal history of malignant neoplasm of pancreas: Secondary | ICD-10-CM | POA: Diagnosis not present

## 2019-04-30 DIAGNOSIS — Z8673 Personal history of transient ischemic attack (TIA), and cerebral infarction without residual deficits: Secondary | ICD-10-CM

## 2019-04-30 DIAGNOSIS — N179 Acute kidney failure, unspecified: Secondary | ICD-10-CM | POA: Diagnosis present

## 2019-04-30 DIAGNOSIS — R531 Weakness: Secondary | ICD-10-CM | POA: Diagnosis not present

## 2019-04-30 DIAGNOSIS — N3000 Acute cystitis without hematuria: Secondary | ICD-10-CM

## 2019-04-30 DIAGNOSIS — N400 Enlarged prostate without lower urinary tract symptoms: Secondary | ICD-10-CM | POA: Diagnosis present

## 2019-04-30 DIAGNOSIS — N210 Calculus in bladder: Secondary | ICD-10-CM | POA: Diagnosis not present

## 2019-04-30 DIAGNOSIS — Z7982 Long term (current) use of aspirin: Secondary | ICD-10-CM

## 2019-04-30 DIAGNOSIS — N209 Urinary calculus, unspecified: Secondary | ICD-10-CM | POA: Diagnosis present

## 2019-04-30 DIAGNOSIS — E876 Hypokalemia: Secondary | ICD-10-CM | POA: Diagnosis not present

## 2019-04-30 DIAGNOSIS — M25551 Pain in right hip: Secondary | ICD-10-CM | POA: Diagnosis not present

## 2019-04-30 DIAGNOSIS — R000 Tachycardia, unspecified: Secondary | ICD-10-CM | POA: Diagnosis not present

## 2019-04-30 DIAGNOSIS — Z20828 Contact with and (suspected) exposure to other viral communicable diseases: Secondary | ICD-10-CM | POA: Diagnosis present

## 2019-04-30 DIAGNOSIS — Z79899 Other long term (current) drug therapy: Secondary | ICD-10-CM | POA: Diagnosis not present

## 2019-04-30 DIAGNOSIS — G9340 Encephalopathy, unspecified: Secondary | ICD-10-CM | POA: Diagnosis present

## 2019-04-30 DIAGNOSIS — N136 Pyonephrosis: Secondary | ICD-10-CM | POA: Diagnosis present

## 2019-04-30 DIAGNOSIS — N10 Acute tubulo-interstitial nephritis: Secondary | ICD-10-CM | POA: Diagnosis present

## 2019-04-30 DIAGNOSIS — I959 Hypotension, unspecified: Secondary | ICD-10-CM | POA: Diagnosis not present

## 2019-04-30 DIAGNOSIS — N320 Bladder-neck obstruction: Secondary | ICD-10-CM | POA: Diagnosis not present

## 2019-04-30 DIAGNOSIS — C61 Malignant neoplasm of prostate: Secondary | ICD-10-CM | POA: Diagnosis present

## 2019-04-30 DIAGNOSIS — Z87891 Personal history of nicotine dependence: Secondary | ICD-10-CM

## 2019-04-30 DIAGNOSIS — N183 Chronic kidney disease, stage 3 (moderate): Secondary | ICD-10-CM | POA: Diagnosis present

## 2019-04-30 DIAGNOSIS — Z209 Contact with and (suspected) exposure to unspecified communicable disease: Secondary | ICD-10-CM | POA: Diagnosis not present

## 2019-04-30 DIAGNOSIS — E785 Hyperlipidemia, unspecified: Secondary | ICD-10-CM | POA: Diagnosis present

## 2019-04-30 DIAGNOSIS — I129 Hypertensive chronic kidney disease with stage 1 through stage 4 chronic kidney disease, or unspecified chronic kidney disease: Secondary | ICD-10-CM | POA: Diagnosis present

## 2019-04-30 DIAGNOSIS — N132 Hydronephrosis with renal and ureteral calculous obstruction: Secondary | ICD-10-CM | POA: Diagnosis not present

## 2019-04-30 DIAGNOSIS — R6520 Severe sepsis without septic shock: Secondary | ICD-10-CM | POA: Diagnosis not present

## 2019-04-30 HISTORY — DX: Unspecified hearing loss, unspecified ear: H91.90

## 2019-04-30 HISTORY — DX: Urinary tract infection, site not specified: N39.0

## 2019-04-30 LAB — COMPREHENSIVE METABOLIC PANEL
ALT: 14 U/L (ref 0–44)
AST: 17 U/L (ref 15–41)
Albumin: 3 g/dL — ABNORMAL LOW (ref 3.5–5.0)
Alkaline Phosphatase: 57 U/L (ref 38–126)
Anion gap: 10 (ref 5–15)
BUN: 30 mg/dL — ABNORMAL HIGH (ref 8–23)
CO2: 24 mmol/L (ref 22–32)
Calcium: 8.3 mg/dL — ABNORMAL LOW (ref 8.9–10.3)
Chloride: 101 mmol/L (ref 98–111)
Creatinine, Ser: 2.78 mg/dL — ABNORMAL HIGH (ref 0.61–1.24)
GFR calc Af Amer: 23 mL/min — ABNORMAL LOW (ref >60)
GFR calc non Af Amer: 19 mL/min — ABNORMAL LOW (ref >60)
Glucose, Bld: 135 mg/dL — ABNORMAL HIGH (ref 70–99)
Potassium: 2.7 mmol/L — CL (ref 3.5–5.1)
Sodium: 135 mmol/L (ref 135–145)
Total Bilirubin: 1.1 mg/dL (ref 0.3–1.2)
Total Protein: 5.5 g/dL — ABNORMAL LOW (ref 6.5–8.1)

## 2019-04-30 LAB — CBC WITH DIFFERENTIAL/PLATELET
Abs Immature Granulocytes: 0.02 10*3/uL (ref 0.00–0.07)
Basophils Absolute: 0 10*3/uL (ref 0.0–0.1)
Basophils Relative: 1 %
Eosinophils Absolute: 0 10*3/uL (ref 0.0–0.5)
Eosinophils Relative: 0 %
HCT: 43.2 % (ref 39.0–52.0)
Hemoglobin: 13.9 g/dL (ref 13.0–17.0)
Immature Granulocytes: 0 %
Lymphocytes Relative: 11 %
Lymphs Abs: 0.6 10*3/uL — ABNORMAL LOW (ref 0.7–4.0)
MCH: 31.2 pg (ref 26.0–34.0)
MCHC: 32.2 g/dL (ref 30.0–36.0)
MCV: 96.9 fL (ref 80.0–100.0)
Monocytes Absolute: 0.3 10*3/uL (ref 0.1–1.0)
Monocytes Relative: 6 %
Neutro Abs: 4.8 10*3/uL (ref 1.7–7.7)
Neutrophils Relative %: 82 %
Platelets: 230 10*3/uL (ref 150–400)
RBC: 4.46 MIL/uL (ref 4.22–5.81)
RDW: 16.2 % — ABNORMAL HIGH (ref 11.5–15.5)
WBC: 5.8 10*3/uL (ref 4.0–10.5)
nRBC: 0 % (ref 0.0–0.2)

## 2019-04-30 LAB — URINALYSIS, ROUTINE W REFLEX MICROSCOPIC
Bilirubin Urine: NEGATIVE
Glucose, UA: NEGATIVE mg/dL
Ketones, ur: NEGATIVE mg/dL
Nitrite: NEGATIVE
Protein, ur: 100 mg/dL — AB
Specific Gravity, Urine: 1.013 (ref 1.005–1.030)
WBC, UA: >50 WBC/hpf — ABNORMAL HIGH (ref 0–5)
pH: 5 (ref 5.0–8.0)

## 2019-04-30 LAB — LACTIC ACID, PLASMA: Lactic Acid, Venous: 1 mmol/L (ref 0.5–1.9)

## 2019-04-30 LAB — SARS CORONAVIRUS 2 BY RT PCR (HOSPITAL ORDER, PERFORMED IN ~~LOC~~ HOSPITAL LAB): SARS Coronavirus 2: NEGATIVE

## 2019-04-30 LAB — PROTIME-INR
INR: 1.3 — ABNORMAL HIGH (ref 0.8–1.2)
Prothrombin Time: 15.6 seconds — ABNORMAL HIGH (ref 11.4–15.2)

## 2019-04-30 LAB — APTT: aPTT: 31 seconds (ref 24–36)

## 2019-04-30 MED ORDER — SODIUM CHLORIDE 0.9 % IV SOLN
2.0000 g | INTRAVENOUS | Status: DC
  Administered 2019-04-30 – 2019-05-03 (×4): 2 g via INTRAVENOUS
  Filled 2019-04-30 (×4): qty 20

## 2019-04-30 MED ORDER — POTASSIUM CHLORIDE 10 MEQ/100ML IV SOLN
10.0000 meq | Freq: Once | INTRAVENOUS | Status: AC
  Administered 2019-04-30: 23:00:00 10 meq via INTRAVENOUS
  Filled 2019-04-30: qty 100

## 2019-04-30 MED ORDER — SODIUM CHLORIDE 0.9 % IV SOLN
INTRAVENOUS | Status: DC
  Administered 2019-04-30: 23:00:00 via INTRAVENOUS

## 2019-04-30 MED ORDER — ACETAMINOPHEN 325 MG PO TABS
650.0000 mg | ORAL_TABLET | Freq: Once | ORAL | Status: AC
  Administered 2019-04-30: 21:00:00 650 mg via ORAL
  Filled 2019-04-30: qty 2

## 2019-04-30 MED ORDER — SODIUM CHLORIDE 0.9 % IV BOLUS
1000.0000 mL | Freq: Once | INTRAVENOUS | Status: AC
  Administered 2019-04-30: 21:00:00 1000 mL via INTRAVENOUS

## 2019-04-30 NOTE — ED Notes (Signed)
Pt belongings packed up and labeled.

## 2019-04-30 NOTE — H&P (Signed)
History and Physical   Barry Swanson AXE:[id number] DOB: [birth year] DOA: 04/30/2019  Referring MD/NP/PA: Dr. Maryan Rued  PCP: Barry Infante, MD   Outpatient Specialists: None   Patient coming from: Home  Chief Complaint: Nausea vomiting and confusion  HPI: Barry Swanson is a 85 y.o. male with medical history significant of hypertension, CVA, hyperlipidemia, hepatitis B, chronic low back pain, pancreatic cancer and osteoarthritis who was recently diagnosed with UTI and bladder stone with UTI.  Patient given antibiotics to take at home.  For the past 3 days however he has been having persistent nausea and vomiting and unable to keep anything down.  He has a 24-hour caregiver.  Daughter however brought patient over due to worsening symptoms.  He is awake now communicating but drowsy.  He has fever and chills.  He has been having some cramps.  In the ER he was found to have evidence of UTI but also potassium of 2.7.  He has continued to have significant weakness.  He was found to have hypotension.  Initial blood pressure 92/60.  No known cough with 19 contacts.  He was given IV fluids and is being admitted to the hospital for treatment.  ED Course: Temperature 100.3 blood pressure 100/65 pulse 72 respiratory rate of 30 oxygen sat 93% room air.  Sodium 135 potassium 2.7 chloride 101 CO2 24 glucose 135 and BUN 30.  Creatinine 2.78 calcium 8.3.  Gap of 10 albumin 3.0.  Total bilirubin 1.1.  CBC entirely within normal.  Lactic acid 1.0.  Turbid urine with large leukocytes WBC more than 50 RBC 21-50 and many bacteria.  Chest x-ray showed no acute findings.  Review of Systems: As per HPI otherwise 10 point review of systems negative.    Past Medical History:  Diagnosis Date  . Hepatitis B   . Hyperlipidemia   . Hypertension   . Stroke [facility])     Past Surgical History:  Procedure Laterality Date  . BLADDER STONE REMOVAL     blasted  . CATARACT EXTRACTION W/ INTRAOCULAR LENS  IMPLANT, BILATERAL  2004   . PROSTATE SURGERY     trimmed prostate away from urethral tract     reports that he quit smoking about 20 years ago. His smoking use included cigarettes. His smokeless tobacco use includes chew. He reports current alcohol use. He reports that he does not use drugs.  No Known Allergies  Family History  Problem Relation Age of Onset  . Heart disease Mother   . Heart disease Father   . Lung cancer Brother   . Parkinson's disease Sister   . Leukemia Brother      Prior to Admission medications   Medication Sig Start Date End Date Taking? Authorizing Provider  alendronate (FOSAMAX) 70 MG tablet Take 70 mg by mouth once a week. Sunday 02/11/15   [provider]  aspirin 81 MG chewable tablet Chew 4 tablets (324 mg total) by mouth daily. 02/28/15   Blanchie Dessert, MD  Coenzyme Q10 (COQ10 PO) Take 1 tablet by mouth daily.    [provider]  felodipine (PLENDIL) 10 MG 24 hr tablet Take 10 mg by mouth daily.    [provider]  finasteride (PROSCAR) 5 MG tablet Take 1 tablet by mouth daily. 01/30/15   [provider]  Fish Oil-Cholecalciferol (OMEGA-3 FISH OIL/VITAMIN D3) 1000-1000 MG-UNIT CAPS Take 1 tablet by mouth daily.    [provider]  galantamine (RAZADYNE ER) 8 MG 24 hr capsule  03/28/15  [provider]  GARLIC PO Take 1 tablet by mouth daily.    [provider]  Multiple Vitamins-Minerals (CENTRUM SILVER) tablet Take 1 tablet by mouth daily. 09/13/12   [provider]  niacin 500 MG tablet Take 500 mg by mouth at bedtime.    [provider]  Potassium Aminobenzoate 500 MG TABS Take 1 tablet by mouth daily.    [provider]  Probiotic Product (ALIGN) 4 MG CAPS Take 1 capsule by mouth daily.    [provider]  simvastatin (ZOCOR) 10 MG tablet Take 10 mg by mouth daily.    [provider]  tamsulosin (FLOMAX) 0.4 MG CAPS capsule Take 1 capsule by mouth daily as needed.  12/16/14   [provider]  valsartan (DIOVAN) 160 MG tablet Take 160 mg by mouth daily as needed (Takes if BP too high).     [provider]    Physical Exam: Vitals:   04/30/19 2011 04/30/19 2022 04/30/19 2315  BP: 119/74  100/65  Pulse: 72  65  Resp: (!) 30  16  Temp: 100.3 F (37.9 C) 100.3 F (37.9 C)   TempSrc: Oral Oral   SpO2: 93%  94%  Weight:  82.1 kg   Height:  5\' 7"  (1.702 m)       Constitutional: Frail, acutely ill looking, seemingly dehydrated no acute distress Vitals:   04/30/19 2011 04/30/19 2022 04/30/19 2315  BP: 119/74  100/65  Pulse: 72  65  Resp: (!) 30  16  Temp: 100.3 F (37.9 C) 100.3 F (37.9 C)   TempSrc: Oral Oral   SpO2: 93%  94%  Weight:  82.1 kg   Height:  5\' 7"  (1.702 m)    Eyes: PERRL, lids and conjunctivae normal ENMT: Mucous membranes are dry posterior pharynx clear of any exudate or lesions.Normal dentition.  Neck: normal, supple, no masses, no thyromegaly Respiratory: clear to auscultation bilaterally, no wheezing, no crackles. Normal respiratory effort. No accessory muscle use.  Cardiovascular: Regular rate and rhythm, no murmurs / rubs / gallops. No extremity edema. 2+ pedal pulses. No carotid bruits.  Abdomen: Mild suprapubic tenderness, no masses palpated. No hepatosplenomegaly. Bowel sounds positive.  Musculoskeletal: no clubbing / cyanosis. No joint deformity upper and lower extremities. Good ROM, no contractures. Normal muscle tone.  Skin: no rashes, lesions, ulcers. No induration Neurologic: CN 2-12 grossly intact. Sensation intact, DTR normal. Strength 5/5 in all 4.  Psychiatric: Drowsy, arousable and able to communicate,.     Labs on Admission: I have personally reviewed following labs and imaging studies  CBC: Recent Labs  Lab 04/30/19 2059  WBC 5.8  NEUTROABS 4.8  HGB 13.9  HCT 43.2  MCV 96.9  PLT 096   Basic Metabolic Panel: Recent Labs  Lab 04/30/19 2059  NA 135  K 2.7*  CL 101  CO2  24  GLUCOSE 135*  BUN 30*  CREATININE 2.78*  CALCIUM 8.3*   GFR: Estimated Creatinine Clearance: 18.8 mL/min (A) (by C-G formula based on SCr of 2.78 mg/dL (H)). Liver Function Tests: Recent Labs  Lab 04/30/19 2059  AST 17  ALT 14  ALKPHOS 57  BILITOT 1.1  PROT 5.5*  ALBUMIN 3.0*   No results for input(s): LIPASE, AMYLASE in the last 168 hours. No results for input(s): AMMONIA in the last 168 hours. Coagulation Profile: Recent Labs  Lab 04/30/19 2059  INR 1.3*   Cardiac Enzymes: No results for input(s): CKTOTAL, CKMB, CKMBINDEX, TROPONINI in the last  168 hours. BNP (last 3 results) No results for input(s): PROBNP in the last 8760 hours. HbA1C: No results for input(s): HGBA1C in the last 72 hours. CBG: No results for input(s): GLUCAP in the last 168 hours. Lipid Profile: No results for input(s): CHOL, HDL, LDLCALC, TRIG, CHOLHDL, LDLDIRECT in the last 72 hours. Thyroid Function Tests: No results for input(s): TSH, T4TOTAL, FREET4, T3FREE, THYROIDAB in the last 72 hours. Anemia Panel: No results for input(s): VITAMINB12, FOLATE, FERRITIN, TIBC, IRON, RETICCTPCT in the last 72 hours. Urine analysis:    Component Value Date/Time   COLORURINE AMBER (A) 04/30/2019 2104   APPEARANCEUR TURBID (A) 04/30/2019 2104   LABSPEC 1.013 04/30/2019 2104   PHURINE 5.0 04/30/2019 2104   GLUCOSEU NEGATIVE 04/30/2019 2104   HGBUR MODERATE (A) 04/30/2019 2104   BILIRUBINUR NEGATIVE 04/30/2019 2104   [place] NEGATIVE 04/30/2019 2104   PROTEINUR 100 (A) 04/30/2019 2104   UROBILINOGEN 1.0 02/28/2015 1550   NITRITE NEGATIVE 04/30/2019 2104   LEUKOCYTESUR LARGE (A) 04/30/2019 2104   Sepsis Labs: @LABRCNTIP (procalcitonin:4,lacticidven:4) ) Recent Results (from the past 240 hour(s))  SARS Coronavirus 2 [facility] order, Performed in [facility] hospital lab) Nasopharyngeal Nasopharyngeal Swab     Status: None   Collection Time: 04/30/19  9:01 PM   Specimen: Nasopharyngeal Swab   Result Value Ref Range Status   SARS Coronavirus 2 NEGATIVE NEGATIVE Final    Comment: (NOTE) If result is NEGATIVE SARS-CoV-2 target nucleic acids are NOT DETECTED. The SARS-CoV-2 RNA is generally detectable in upper and lower  respiratory specimens during the acute phase of infection. The lowest  concentration of SARS-CoV-2 viral copies this assay can detect is 250  copies / mL. A negative result does not preclude SARS-CoV-2 infection  and should not be used as the sole basis for treatment or other  patient management decisions.  A negative result may occur with  improper specimen collection / handling, submission of specimen other  than nasopharyngeal swab, presence of viral mutation(s) within the  areas targeted by this assay, and inadequate number of viral copies  (<250 copies / mL). A negative result must be combined with clinical  observations, patient history, and epidemiological information. If result is POSITIVE SARS-CoV-2 target nucleic acids are DETECTED. The SARS-CoV-2 RNA is generally detectable in upper and lower  respiratory specimens dur ing the acute phase of infection.  Positive  results are indicative of active infection with SARS-CoV-2.  Clinical  correlation with patient history and other diagnostic information is  necessary to determine patient infection status.  Positive results do  not rule out bacterial infection or co-infection with other viruses. If result is PRESUMPTIVE POSTIVE SARS-CoV-2 nucleic acids MAY BE PRESENT.   A presumptive positive result was obtained on the submitted specimen  and confirmed on repeat testing.  While 2019 novel coronavirus  (SARS-CoV-2) nucleic acids may be present in the submitted sample  additional confirmatory testing may be necessary for epidemiological  and / or clinical management purposes  to differentiate between  SARS-CoV-2 and other Sarbecovirus currently known to infect humans.  If clinically indicated additional  testing with an alternate test  methodology [phone]) is advised. The SARS-CoV-2 RNA is generally  detectable in upper and lower respiratory sp ecimens during the acute  phase of infection. The expected result is Negative. Fact Sheet for Patients:  StrictlyIdeas.no Fact Sheet for Healthcare Providers: BankingDealers.co.za This test is not yet approved or cleared by the [place] FDA and has been authorized for detection and/or diagnosis of  SARS-CoV-2 by FDA under an Emergency Use Authorization (EUA).  This EUA will remain in effect (meaning this test can be used) for the duration of the COVID-19 declaration under Section 564(b)(1) of the Act, 21 U.S.C. section 360bbb-3(b)(1), unless the authorization is terminated or revoked sooner. Performed at [facility], [place] [address] [place], [place] [address]      Radiological Exams on Admission: Dg Chest Port 1 View  Result Date: 04/30/2019 CLINICAL DATA:  Patient with history of urinary tract infection. Confusion. EXAM: PORTABLE CHEST 1 VIEW COMPARISON:  Chest radiograph 02/28/2015 FINDINGS: Monitoring leads overlie the patient. Stable cardiac and mediastinal contours. No consolidative pulmonary opacities. No pleural effusion or pneumothorax. IMPRESSION: No acute cardiopulmonary process. Electronically Signed   By: Lovey Newcomer M.D.   On: 04/30/2019 20:58    Assessment/Plan Principal Problem:   Sepsis ([place]) Active Problems:   Acute encephalopathy   Hyperlipidemia   Essential hypertension   Calculus (=stone)   Malignant neoplasm of prostate (HCC)   Bladder outflow obstruction   Acute pyelonephritis   Acute kidney injury ([place])     #1 sepsis: Early sepsis most likely secondary to UTI.  He has failed outpatient treatment and unable to keep antibiotics down.  Initiate IV fluids and IV antibiotics.  Follow blood and urine culture results.  #2 acute pyelonephritis: Suspected renal  stones.  Patient has a CT abdomen pelvis on September 4 showing cystitis with indwelling 3.5 cm bladder calculus referral to urology has been made already prior to coming in.  This could be complex UTI from infected bladder stone.  Urine culture sensitivities pending IV Rocephin started.  Urology may need to be consulted.  #3 acute encephalopathy: Secondary to UTI.  Slowly improving.  Continue management as above and monitor.  #4 essential hypertension: Hypotensive initially.  Blood pressure is rebounding.  Resume home regimen once stable  #5 bladder outflow obstruction: History of pancreatic cancer as well as possible BPH.  Offered Foley catheter if needed.  #6 acute kidney injury: BUN/creatinine elevated.  Hydrate aggressively and monitor   DVT prophylaxis: Heparin Code Status: Full code Family Communication: Daughter at bedside Disposition Plan: To be determined Consults called: Urology consult in the morning Admission status: Inpatient  Severity of Illness: The appropriate patient status for this patient is INPATIENT. Inpatient status is judged to be reasonable and necessary in order to provide the required intensity of service to ensure the patient's safety. The patient's presenting symptoms, physical exam findings, and initial radiographic and laboratory data in the context of their chronic comorbidities is felt to place them at high risk for further clinical deterioration. Furthermore, it is not anticipated that the patient will be medically stable for discharge from the hospital within 2 midnights of admission. The following factors support the patient status of inpatient.   " The patient's presenting symptoms include nausea vomiting and fever. " The worrisome physical exam findings include acutely ill looking with evidence of UTI. " The initial radiographic and laboratory data are worrisome because of urinalysis indicating significant UTI. " The chronic co-morbidities include  hypertension and hyperlipidemia.   * I certify that at the point of admission it is my clinical judgment that the patient will require inpatient hospital care spanning beyond 2 midnights from the point of admission due to high intensity of service, high risk for further deterioration and high frequency of surveillance required.Barbette Merino MD [facility] Pager [phone]  If 7PM-7AM, please contact night-coverage [email or web address]  Password [email or web address]  04/30/2019, 11:35 PM

## 2019-04-30 NOTE — ED Triage Notes (Signed)
[facility] reports pt with recent UTI, NV, 100.0 temp, Hx bladder stones, 193 cbg, soft pressure 92/60 76 hr.

## 2019-04-30 NOTE — ED Provider Notes (Signed)
[facility] EMERGENCY DEPARTMENT Provider Note   CSN: [id number] Arrival date & time: 04/30/19  2010     History   Chief Complaint Chief Complaint  Patient presents with   generalized weakness    HPI Barry Swanson is a 85 y.o. male.     Patient is an 85 year old male with a history of hypertension, hyperlipidemia, stroke, prostate cancer with bladder outflow obstruction who is presenting today with worsening symptoms of weakness and fever.  Patient is not able to give much history but is awake and alert.  EMS reported that earlier this week patient was diagnosed with a urinary tract infection.  He was started on antibiotics but was having nausea and vomiting and having difficulty tolerating the antibiotic.  He had not had any vomiting today but was having generalized weakness and just not feeling well.  Patient states he has had a mild cough but denies any shortness of breath, chest pain, abdominal pain.  EMS noted the patient's initial blood pressure was 92/60 with a normal oxygen saturation and heart rate.  Patient was given 700 mL's of fluid in route with improvement of blood pressure upon arrival.  Patient has a history of bladder stones but again denies flank pain or abdominal pain at this time.  No known COVID contacts.  Unable to contact the wife at this time.  Per EMS patient was able to ambulate to the stretcher with minimal assistance.  The history is provided by the patient and the EMS personnel.    Past Medical History:  Diagnosis Date   Hepatitis B    Hyperlipidemia    Hypertension    Prostate cancer 2008   Stroke     Patient Active Problem List   Diagnosis Date Noted   Confusion    Essential hypertension    Acute encephalopathy 02/11/2015   TIA (transient ischemic attack) 02/11/2015   Hypertension    Hyperlipidemia    Calculus (=stone) 11/29/2012   Malignant neoplasm of prostate ([place]) 11/29/2012   Bladder outflow obstruction  11/29/2012    Past Surgical History:  Procedure Laterality Date   BLADDER STONE REMOVAL     blasted   CATARACT EXTRACTION W/ INTRAOCULAR LENS  IMPLANT, BILATERAL  2004   PROSTATE SURGERY     trimmed prostate away from urethral tract        Home Medications    Prior to Admission medications   Medication Sig Start Date End Date Taking? Authorizing Provider  alendronate (FOSAMAX) 70 MG tablet Take 70 mg by mouth once a week. Sunday 02/11/15   [provider]  aspirin 81 MG chewable tablet Chew 4 tablets (324 mg total) by mouth daily. 02/28/15   Blanchie Dessert, MD  Coenzyme Q10 (COQ10 PO) Take 1 tablet by mouth daily.    [provider]  felodipine (PLENDIL) 10 MG 24 hr tablet Take 10 mg by mouth daily.    [provider]  finasteride (PROSCAR) 5 MG tablet Take 1 tablet by mouth daily. 01/30/15   [provider]  Fish Oil-Cholecalciferol (OMEGA-3 FISH OIL/VITAMIN D3) 1000-1000 MG-UNIT CAPS Take 1 tablet by mouth daily.    [provider]  galantamine (RAZADYNE ER) 8 MG 24 hr capsule  03/28/15   [provider]  GARLIC PO Take 1 tablet by mouth daily.    [provider]  Multiple Vitamins-Minerals (CENTRUM SILVER) tablet Take 1 tablet by mouth daily. 09/13/12   [provider]  niacin 500 MG tablet Take 500  mg by mouth at bedtime.    [provider]  Potassium Aminobenzoate 500 MG TABS Take 1 tablet by mouth daily.    [provider]  Probiotic Product (ALIGN) 4 MG CAPS Take 1 capsule by mouth daily.    [provider]  simvastatin (ZOCOR) 10 MG tablet Take 10 mg by mouth daily.    [provider]  tamsulosin (FLOMAX) 0.4 MG CAPS capsule Take 1 capsule by mouth daily as needed. 12/16/14   [provider]  valsartan (DIOVAN) 160 MG tablet Take 160 mg by mouth daily as needed (Takes if BP too high).     [provider]    Family History Family History  Problem  Relation Age of Onset   Heart disease Mother    Heart disease Father    Lung cancer Brother    Parkinson's disease Sister    Leukemia Brother     Social History Social History   Tobacco Use   Smoking status: Former Smoker    Types: Cigarettes    Quit date: 08/17/1998    Years since quitting: 20.7   Smokeless tobacco: Current User    Types: Chew  Substance Use Topics   Alcohol use: Yes    Comment: rarely-1-2 beers a year   Drug use: No     Allergies   Patient has no known allergies.   Review of Systems Review of Systems  All other systems reviewed and are negative.    Physical Exam Updated Vital Signs BP 119/74 (BP Location: Right Arm)    Pulse 72    Temp 100.3 F (37.9 C) (Oral)    Resp (!) 30    SpO2 93%   Physical Exam Vitals signs and nursing note reviewed.  Constitutional:      General: He is not in acute distress.    Appearance: He is well-developed. He is obese.  HENT:     Head: Normocephalic and atraumatic.     Nose: Nose normal.     Mouth/Throat:     Mouth: Mucous membranes are dry.  Eyes:     Conjunctiva/sclera: Conjunctivae normal.     Pupils: Pupils are equal, round, and reactive to light.  Neck:     Musculoskeletal: Normal range of motion and neck supple.  Cardiovascular:     Rate and Rhythm: Regular rhythm. Tachycardia present.     Pulses: Normal pulses.     Heart sounds: No murmur.  Pulmonary:     Effort: Pulmonary effort is normal. Tachypnea present. No respiratory distress.     Breath sounds: Normal breath sounds. No wheezing or rales.  Abdominal:     General: There is distension.     Palpations: Abdomen is soft. There is fluid wave.     Tenderness: There is no abdominal tenderness. There is no right CVA tenderness, left CVA tenderness, guarding or rebound.  Musculoskeletal: Normal range of motion.        General: No tenderness.  Skin:    General: Skin is warm and dry.     Capillary Refill: Capillary refill takes 2 to 3  seconds.     Coloration: Skin is jaundiced.     Findings: No erythema or rash.     Comments: Appears slightly jaundiced  Neurological:     General: No focal deficit present.     Mental Status: He is alert and oriented to person, place, and time. Mental status is at baseline.  Psychiatric:  Mood and Affect: Mood normal.        Behavior: Behavior normal.      ED Treatments / Results  Labs (all labs ordered are listed, but only abnormal results are displayed) Labs Reviewed  COMPREHENSIVE METABOLIC PANEL - Abnormal; Notable for the following components:      Result Value   Potassium 2.7 (*)    Glucose, Bld 135 (*)    BUN 30 (*)    Creatinine, Ser 2.78 (*)    Calcium 8.3 (*)    Total Protein 5.5 (*)    Albumin 3.0 (*)    GFR calc non Af Amer 19 (*)    GFR calc Af Amer 23 (*)    All other components within normal limits  CBC WITH DIFFERENTIAL/PLATELET - Abnormal; Notable for the following components:   RDW 16.2 (*)    Lymphs Abs 0.6 (*)    All other components within normal limits  PROTIME-INR - Abnormal; Notable for the following components:   Prothrombin Time 15.6 (*)    INR 1.3 (*)    All other components within normal limits  URINALYSIS, ROUTINE W REFLEX MICROSCOPIC - Abnormal; Notable for the following components:   Color, Urine AMBER (*)    APPearance TURBID (*)    Hgb urine dipstick MODERATE (*)    Protein, ur 100 (*)    Leukocytes,Ua LARGE (*)    WBC, UA >50 (*)    Bacteria, UA FEW (*)    All other components within normal limits  SARS CORONAVIRUS 2 (HOSPITAL ORDER, [facility])  CULTURE, BLOOD (ROUTINE X 2)  CULTURE, BLOOD (ROUTINE X 2)  URINE CULTURE  LACTIC ACID, PLASMA  APTT    EKG EKG Interpretation  Date/Time:  Sunday April 30 2019 20:11:30 EDT Ventricular Rate:  72 PR Interval:    QRS Duration: 104 QT Interval:  400 QTC Calculation: 438 R Axis:   -42 Text Interpretation:  Sinus rhythm Atrial premature  complexes Left anterior fascicular block Low voltage, precordial leads Consider anterior infarct Minimal ST depression, lateral leads No significant change since last tracing Confirmed by ,  ([id number]) on 04/30/2019 8:38:02 PM   Radiology Dg Chest Port 1 View  Result Date: 04/30/2019 CLINICAL DATA:  Patient with history of urinary tract infection. Confusion. EXAM: PORTABLE CHEST 1 VIEW COMPARISON:  Chest radiograph 02/28/2015 FINDINGS: Monitoring leads overlie the patient. Stable cardiac and mediastinal contours. No consolidative pulmonary opacities. No pleural effusion or pneumothorax. IMPRESSION: No acute cardiopulmonary process. Electronically Signed   By: Drew  Davis M.D.   On: 04/30/2019 20:58    Procedures Procedures (including critical care time)  Medications Ordered in ED Medications  sodium chloride 0.9 % bolus 1,000 mL (has no administration in time range)  acetaminophen (TYLENOL) tablet 650 mg (has no administration in time range)     Initial Impression / Assessment and Plan / ED Course  I have reviewed the triage vital signs and the nursing notes.  Pertinent labs & imaging results that were available during my care of the patient were reviewed by me and considered in my medical decision making (see chart for details).        85  year old elderly male presenting today with symptoms of sepsis.  Patient is febrile, tachypneic and initially was hypotensive with EMS.  Patient received 700 mL's of fluid upon arrival blood pressure has improved to 119/74.  He is still febrile and will treat with Tylenol.  Patient has been treated for a UTI  as an outpatient earlier this week but was not tolerating the medication and was having vomiting.  He is currently complaining of just feeling generally weak.  Patient has no localized pain.  Does have some mild ascites to the abdomen and some minimal jaundice but had a prior history of hepatitis unknown if he has liver disease.   Patient's urine is cloudy and concerning for infection.  Breath sounds are clear.  Sepsis protocol initiated.  Patient will get a full bolus of fluid until lactate returns.  10:52 PM Patient's lactate is within normal limits, UA is consistent with UTI, COVID negative, CMP with new AKI with a creatinine of 2.78 and a potassium of 2.7.  Anion gap within normal limits.  CBC within normal limits.  X-ray within normal limits.  Patient received ceftriaxone for the UTI.  Will admit for further care.  Testing was replaced IV.  Patient was started on normal saline at 125.  Blood pressure continues to range to 22-979 systolic.  CRITICAL CARE Performed by: Curley Fayette Total critical care time: 30 minutes Critical care time was exclusive of separately billable procedures and treating other patients. Critical care was necessary to treat or prevent imminent or life-threatening deterioration. Critical care was time spent personally by me on the following activities: development of treatment plan with patient and/or surrogate as well as nursing, discussions with consultants, evaluation of patient's response to treatment, examination of patient, obtaining history from patient or surrogate, ordering and performing treatments and interventions, ordering and review of laboratory studies, ordering and review of radiographic studies, pulse oximetry and re-evaluation of patient's condition.  Final Clinical Impressions(s) / ED Diagnoses   Final diagnoses:  Sepsis with acute renal failure without septic shock, due to unspecified organism, unspecified acute renal failure type [facility])  Acute cystitis without hematuria  Hypokalemia    ED Discharge Orders    None       Blanchie Dessert, MD 04/30/19 [id number]

## 2019-04-30 NOTE — ED Notes (Signed)
EMT to draw labs and cultures

## 2019-04-30 NOTE — ED Notes (Signed)
This RN and EMT worked together to get labs on this pt. Both IV's this RN inserted blew.

## 2019-05-01 ENCOUNTER — Inpatient Hospital Stay (HOSPITAL_COMMUNITY): Payer: Medicare Other

## 2019-05-01 ENCOUNTER — Encounter (HOSPITAL_COMMUNITY): Payer: Self-pay | Admitting: General Practice

## 2019-05-01 DIAGNOSIS — N10 Acute tubulo-interstitial nephritis: Secondary | ICD-10-CM | POA: Diagnosis not present

## 2019-05-01 DIAGNOSIS — G9340 Encephalopathy, unspecified: Secondary | ICD-10-CM | POA: Diagnosis not present

## 2019-05-01 DIAGNOSIS — N179 Acute kidney failure, unspecified: Secondary | ICD-10-CM | POA: Diagnosis not present

## 2019-05-01 DIAGNOSIS — N3000 Acute cystitis without hematuria: Secondary | ICD-10-CM

## 2019-05-01 DIAGNOSIS — N132 Hydronephrosis with renal and ureteral calculous obstruction: Secondary | ICD-10-CM | POA: Diagnosis not present

## 2019-05-01 LAB — COMPREHENSIVE METABOLIC PANEL
ALT: 18 U/L (ref 0–44)
AST: 26 U/L (ref 15–41)
Albumin: 3 g/dL — ABNORMAL LOW (ref 3.5–5.0)
Alkaline Phosphatase: 55 U/L (ref 38–126)
Anion gap: 13 (ref 5–15)
BUN: 30 mg/dL — ABNORMAL HIGH (ref 8–23)
CO2: 20 mmol/L — ABNORMAL LOW (ref 22–32)
Calcium: 8.1 mg/dL — ABNORMAL LOW (ref 8.9–10.3)
Chloride: 105 mmol/L (ref 98–111)
Creatinine, Ser: 2.76 mg/dL — ABNORMAL HIGH (ref 0.61–1.24)
GFR calc Af Amer: 23 mL/min — ABNORMAL LOW (ref >60)
GFR calc non Af Amer: 20 mL/min — ABNORMAL LOW (ref >60)
Glucose, Bld: 110 mg/dL — ABNORMAL HIGH (ref 70–99)
Potassium: 3.3 mmol/L — ABNORMAL LOW (ref 3.5–5.1)
Sodium: 138 mmol/L (ref 135–145)
Total Bilirubin: 1 mg/dL (ref 0.3–1.2)
Total Protein: 5.7 g/dL — ABNORMAL LOW (ref 6.5–8.1)

## 2019-05-01 LAB — PROCALCITONIN: Procalcitonin: 0.25 ng/mL

## 2019-05-01 MED ORDER — POTASSIUM CHLORIDE CRYS ER 20 MEQ PO TBCR
40.0000 meq | EXTENDED_RELEASE_TABLET | Freq: Once | ORAL | Status: AC
  Administered 2019-05-01: 40 meq via ORAL
  Filled 2019-05-01: qty 2

## 2019-05-01 MED ORDER — FINASTERIDE 5 MG PO TABS
5.0000 mg | ORAL_TABLET | Freq: Every day | ORAL | Status: DC
  Administered 2019-05-01 – 2019-05-04 (×4): 5 mg via ORAL
  Filled 2019-05-01 (×5): qty 1

## 2019-05-01 MED ORDER — HYDRALAZINE HCL 20 MG/ML IJ SOLN: 10.0000 mg | Freq: Four times a day (QID) | INTRAMUSCULAR | Status: DC | PRN

## 2019-05-01 MED ORDER — TAMSULOSIN HCL 0.4 MG PO CAPS
0.4000 mg | ORAL_CAPSULE | Freq: Every day | ORAL | Status: DC
  Administered 2019-05-01 – 2019-05-04 (×4): 0.4 mg via ORAL
  Filled 2019-05-01 (×4): qty 1

## 2019-05-01 MED ORDER — SODIUM CHLORIDE 0.9 % IV SOLN
INTRAVENOUS | Status: DC
  Administered 2019-05-01: 04:00:00 via INTRAVENOUS

## 2019-05-01 MED ORDER — ONDANSETRON HCL 4 MG PO TABS: 4.0000 mg | ORAL_TABLET | Freq: Four times a day (QID) | ORAL | Status: DC | PRN

## 2019-05-01 MED ORDER — ONDANSETRON HCL 4 MG/2ML IJ SOLN
4.0000 mg | Freq: Four times a day (QID) | INTRAMUSCULAR | Status: DC | PRN
  Filled 2019-05-01: qty 2

## 2019-05-01 MED ORDER — SIMVASTATIN 20 MG PO TABS
10.0000 mg | ORAL_TABLET | Freq: Every day | ORAL | Status: DC
  Administered 2019-05-01 – 2019-05-04 (×4): 10 mg via ORAL
  Filled 2019-05-01 (×4): qty 1

## 2019-05-01 MED ORDER — ASPIRIN 81 MG PO CHEW
324.0000 mg | CHEWABLE_TABLET | Freq: Every day | ORAL | Status: DC
  Administered 2019-05-01 – 2019-05-04 (×4): 324 mg via ORAL
  Filled 2019-05-01 (×5): qty 4

## 2019-05-01 MED ORDER — POTASSIUM CHLORIDE 2 MEQ/ML IV SOLN
INTRAVENOUS | Status: DC
  Administered 2019-05-01 – 2019-05-04 (×5): via INTRAVENOUS
  Filled 2019-05-01 (×9): qty 1000

## 2019-05-01 MED ORDER — SODIUM CHLORIDE 0.9 % IV BOLUS
500.0000 mL | Freq: Once | INTRAVENOUS | Status: AC
  Administered 2019-05-01: 500 mL via INTRAVENOUS

## 2019-05-01 MED ORDER — ENOXAPARIN SODIUM 40 MG/0.4ML ~~LOC~~ SOLN
40.0000 mg | Freq: Every day | SUBCUTANEOUS | Status: DC
  Administered 2019-05-01 – 2019-05-02 (×2): 40 mg via SUBCUTANEOUS
  Filled 2019-05-01 (×3): qty 0.4

## 2019-05-01 NOTE — ED Notes (Signed)
Patient transported to Ultrasound 

## 2019-05-01 NOTE — ED Notes (Signed)
Tele ordered bfast 

## 2019-05-01 NOTE — Progress Notes (Signed)
Barry Swanson [id number] Admission Data: 05/01/2019 5:45 PM Attending Provider: Thurnell Lose, MD  MBE:[id number], Elta Guadeloupe, MD Consults/ Treatment Team: Treatment Team:  Ardis Hughs, MD  Barry Swanson is a 85 y.o. male patient admitted from ED awake, alert  & orientated  X 3,  Full Code, VSS - Blood pressure (!) 147/68, pulse 89, temperature 98.4 F (36.9 C), temperature source Oral, resp. rate (!) 22, height 5\' 7"  (1.702 m), weight 87.5 kg, SpO2 97 %., O2    3 L nasal cannular, no c/o shortness of breath, no c/o chest pain, no distress noted. Tele # 32 placed and pt is currently running:normal sinus rhythm.   IV site WDL:  hand left, condition patent and no redness with a transparent dsg that's clean dry and intact.  Allergies:  No Known Allergies   Past Medical History:  Diagnosis Date  . Hepatitis B   . Hyperlipidemia   . Hypertension   . Stroke [facility])     History:  obtained from chart review. Tobacco/alcohol: unknown tobacco use none  Pt orientation to unit, room and routine. Information packet given to patient/family and safety video watched.  Admission INP armband ID verified with patient/family, and in place. SR up x 2, fall risk assessment complete with Patient and family verbalizing understanding of risks associated with falls. Pt verbalizes an understanding of how to use the call bell and to call for help before getting out of bed.  Skin, clean-dry- intact without evidence of bruising, or skin tears.   No evidence of skin break down noted on exam. no rashes, no petechiae, no nodules, no jaundice, no purpura, no wounds, only bruising to right hand.   Will cont to monitor and assist as needed.  Cathlean Marseilles Tamala Julian, MSN, RN, [place], [place] 05/01/2019 5:45 PM

## 2019-05-01 NOTE — Progress Notes (Signed)
PROGRESS NOTE                                                                                                                                                                                                             Patient Demographics:    Barry Swanson, is a 85 y.o. male, DOB - [birth year], HYI:[id number]  Admit date - 04/30/2019   Admitting Physician No admitting provider for patient encounter.  Outpatient Primary MD for the patient is [name], MD  LOS - 1  Chief Complaint  Patient presents with  . generalized weakness       Brief Narrative  Barry Swanson is a 85 y.o. male with medical history significant of hypertension, CVA, hyperlipidemia, hepatitis B, chronic low back pain, pancreatic cancer and osteoarthritis who was recently diagnosed with UTI and bladder stone with UTI.  Patient given antibiotics to take at home, he was brought back with 3-day history of nausea vomiting, confusion, fever chills.  In the ER diagnosed with toxic encephalopathy, AKI, sepsis due to UTI and admitted.   Subjective:    [name] today has, No headache, No chest pain, No abdominal pain - No Nausea, No new weakness tingling or numbness, No Cough - SOB.     Assessment  & Plan :     1.  Sepsis due to UTI.  UTI is complex as patient has multiple retained urinary bladder stones, mild left-sided hydronephrosis without any distinct obstruction, cultures pending, trend procalcitonin and clinically, continue IV fluids, sepsis pathophysiology has improved, urology has been consulted.  Follow serial PVRs, will monitor.  2.  Toxic encephalopathy.  Due to #1 above.  Supportive care.  3.  AKI.  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  Last creatinine is 4 years ago which is close to 1.5.  He could have easily progressed in the meantime, for now hydrate and monitor avoid nephrotoxins including ARB.  4.  Hypokalemia.  Replaced.  5.  Essential hypertension.  Currently hypotensive, hydrate hold blood  pressure medications PRN hydralazine.  6.  Dyslipidemia.  Placed on statin.  7.  BPH and history of prostate cancer.  Resume Flomax.  Urology on board.     Family Communication  :  Called wife Hassan Rowan 9/14 @ 10.14 am - left message  Code Status :  Full  Disposition Plan  :  Inpt  Consults  :  Urology  Procedures  :    CT - Suspected cystitis.  Indwelling 3.5 cm bladder calculus.  Associated irregular bladder wall thickening is poorly evaluated on CT. However, given additional findings, cystoscopy is suggested to exclude primary bladder neoplasm. Mild right hydroureteronephrosis. No associated ureteral calculus on CT. Prostatomegaly in this patient with known prostate cancer. Small upper abdominal/retroperitoneal lymph nodes are similar to 2013, likely reactive. Correlate with PSA. Otherwise, no findings suspicious for metastatic disease. No focal osseous lesions.  Renal US - non acute  DVT Prophylaxis  :  Lovenox   Lab Results  Component Value Date   PLT 230 04/30/2019    Diet :  Diet Order            Diet Heart Room service appropriate? Yes; Fluid consistency: Thin  Diet effective now               Inpatient Medications Scheduled Meds: . enoxaparin (LOVENOX) injection  40 mg Subcutaneous Daily   Continuous Infusions: . cefTRIAXone (ROCEPHIN)  IV Stopped (04/30/19 2138)  . lactated ringers with kcl     PRN Meds:.ondansetron **OR** ondansetron (ZOFRAN) IV  Antibiotics  :   Anti-infectives (From admission, onward)   Start     Dose/Rate Route Frequency Ordered Stop   04/30/19 2100  cefTRIAXone (ROCEPHIN) 2 g in sodium chloride 0.9 % 100 mL IVPB     2 g 200 mL/hr over 30 Minutes Intravenous Every 24 hours 04/30/19 2030            Objective:   Vitals:   05/01/19 0345 05/01/19 0745 05/01/19 0830 05/01/19 0943  BP: (!) 116/95 106/63 105/72 104/65  Pulse: 75 68  (!) 44  Resp: (!) 26 (!) 26 20 (!) 26  Temp:      TempSrc:      SpO2: 98% 94%  95%  Weight:       Height:        Wt Readings from Last 3 Encounters:  04/30/19 82.1 kg  04/17/15 94.2 kg  02/11/15 95.3 kg     Intake/Output Summary (Last 24 hours) at 05/01/2019 0959 Last data filed at 05/01/2019 0934 Gross per 24 hour  Intake 1405.01 ml  Output 25 ml  Net 1380.01 ml     Physical Exam  Awake, confused, No new F.N deficits, Normal affect [place].AT,PERRAL Supple Neck,No JVD, No cervical lymphadenopathy appriciated.  Symmetrical Chest wall movement, Good air movement bilaterally, CTAB RRR,No Gallops,Rubs or new Murmurs, No Parasternal Heave +ve B.Sounds, Abd Soft, No tenderness, No organomegaly appriciated, No rebound - guarding or rigidity. No Cyanosis, Clubbing or edema, No new Rash or bruise      Data Review:    CBC Recent Labs  Lab 04/30/19 2059  WBC 5.8  HGB 13.9  HCT 43.2  PLT 230  MCV 96.9  MCH 31.2  MCHC 32.2  RDW 16.2*  LYMPHSABS 0.6*  MONOABS 0.3  EOSABS 0.0  BASOSABS 0.0    Chemistries  Recent Labs  Lab 04/30/19 2059 05/01/19 0519  NA 135 138  K 2.7* 3.3*  CL 101 105  CO2 24 20*  GLUCOSE 135* 110*  BUN 30* 30*  CREATININE 2.78* 2.76*  CALCIUM 8.3* 8.1*  AST 17 26  ALT 14 18  ALKPHOS 57 55  BILITOT 1.1 1.0   ------------------------------------------------------------------------------------------------------------------ No results for input(s): CHOL, HDL, LDLCALC, TRIG, CHOLHDL, LDLDIRECT in the last 72 hours.  Lab Results  Component Value Date   HGBA1C 5.1 02/11/2015   ------------------------------------------------------------------------------------------------------------------ No results for input(s): TSH, T4TOTAL, T3FREE, THYROIDAB in the last 72 hours.  Invalid input(s): FREET3 ------------------------------------------------------------------------------------------------------------------ No results  for input(s): VITAMINB12, FOLATE, FERRITIN, TIBC, IRON, RETICCTPCT in the last 72 hours.  Coagulation profile Recent Labs   Lab 04/30/19 2059  INR 1.3*    No results for input(s): DDIMER in the last 72 hours.  Cardiac Enzymes No results for input(s): CKMB, TROPONINI, MYOGLOBIN in the last 168 hours.  Invalid input(s): CK ------------------------------------------------------------------------------------------------------------------ No results found for: BNP  Micro Results Recent Results (from the past 240 hour(s))  SARS Coronavirus 2 [facility] order, Performed in [facility] hospital lab) Nasopharyngeal Nasopharyngeal Swab     Status: None   Collection Time: 04/30/19  9:01 PM   Specimen: Nasopharyngeal Swab  Result Value Ref Range Status   SARS Coronavirus 2 NEGATIVE NEGATIVE Final    Comment: (NOTE) If result is NEGATIVE SARS-CoV-2 target nucleic acids are NOT DETECTED. The SARS-CoV-2 RNA is generally detectable in upper and lower  respiratory specimens during the acute phase of infection. The lowest  concentration of SARS-CoV-2 viral copies this assay can detect is 250  copies / mL. A negative result does not preclude SARS-CoV-2 infection  and should not be used as the sole basis for treatment or other  patient management decisions.  A negative result may occur with  improper specimen collection / handling, submission of specimen other  than nasopharyngeal swab, presence of viral mutation(s) within the  areas targeted by this assay, and inadequate number of viral copies  (<250 copies / mL). A negative result must be combined with clinical  observations, patient history, and epidemiological information. If result is POSITIVE SARS-CoV-2 target nucleic acids are DETECTED. The SARS-CoV-2 RNA is generally detectable in upper and lower  respiratory specimens dur ing the acute phase of infection.  Positive  results are indicative of active infection with SARS-CoV-2.  Clinical  correlation with patient history and other diagnostic information is  necessary to determine patient infection status.   Positive results do  not rule out bacterial infection or co-infection with other viruses. If result is PRESUMPTIVE POSTIVE SARS-CoV-2 nucleic acids MAY BE PRESENT.   A presumptive positive result was obtained on the submitted specimen  and confirmed on repeat testing.  While 2019 novel coronavirus  (SARS-CoV-2) nucleic acids may be present in the submitted sample  additional confirmatory testing may be necessary for epidemiological  and / or clinical management purposes  to differentiate between  SARS-CoV-2 and other Sarbecovirus currently known to infect humans.  If clinically indicated additional testing with an alternate test  methodology [phone]) is advised. The SARS-CoV-2 RNA is generally  detectable in upper and lower respiratory sp ecimens during the acute  phase of infection. The expected result is Negative. Fact Sheet for Patients:  StrictlyIdeas.no Fact Sheet for Healthcare Providers: BankingDealers.co.za This test is not yet approved or cleared by the [place] FDA and has been authorized for detection and/or diagnosis of SARS-CoV-2 by FDA under an Emergency Use Authorization (EUA).  This EUA will remain in effect (meaning this test can be used) for the duration of the COVID-19 declaration under Section 564(b)(1) of the Act, 21 U.S.C. section 360bbb-3(b)(1), unless the authorization is terminated or revoked sooner. Performed at [facility], [place] [address], [place], [place] [address]     Radiology Reports Ct Abdomen Pelvis Wo Contrast  Result Date: 04/21/2019 CLINICAL DATA:  Vomiting, weight loss, history of prostate cancer status post TURP EXAM: CT ABDOMEN AND PELVIS WITHOUT CONTRAST TECHNIQUE: Multidetector CT imaging of the abdomen and pelvis was performed following the standard protocol without IV contrast. COMPARISON:  CT abdomen/pelvis dated 06/03/2012 FINDINGS: Lower chest: 3 mm subpleural nodule in the right  lower lobe (series 9/image 8), unchanged, benign. Hepatobiliary: Unenhanced liver is unremarkable. Gallbladder is unremarkable. No intrahepatic or extrahepatic ductal dilatation. Pancreas: Within normal limits. Spleen: Within normal limits. Adrenals/Urinary Tract: Adrenal glands are within normal limits. Left kidney is notable for a 16 mm benign hemorrhagic cysts in the lateral left upper pole (series 2/image 43). Additional 13 mm simple cyst in the posterior left lower kidney (series 2/image 47). 2 mm nonobstructing left lower pole renal calculus (series 2/image 51). No hydronephrosis. Right kidney is notable for mild right hydronephrosis with prominent extrarenal pelvis. No obstructing ureteral calculus is seen. Bladder wall thickening with trabeculation and perivesical stranding, suggesting cystitis, although an underlying bladder neoplasm along the right posterolateral bladder is not excluded (series 2/image 80). Additional irregular bladder wall thickening along the left superior bladder dome (series 2/image 36). Associated 3.5 cm bladder calculus. Stomach/Bowel: Contrast within the mid/distal esophagus, nonspecific but possibly reflecting gastroesophageal reflux. Stomach is within normal limits. No evidence of bowel obstruction. Small to moderate duodenal diverticulum (series 2/image 43). Appendix is not discretely visualized. Left colonic diverticulosis. Adjacent pericolonic stranding along the sigmoid colon is favored to be related to the bladder inflammation rather than sigmoid diverticulitis. Vascular/Lymphatic: No evidence of abdominal aortic aneurysm. Atherosclerotic calcifications of the abdominal aorta and branch vessels. Small upper abdominal and retroperitoneal lymph nodes, including a dominant 9 mm short axis inferior aortocaval node (series 2/image 55). These findings are similar to 2013 and therefore likely reactive. Reproductive: Prostatomegaly in this patient with known prostate cancer. Enlarged  central gland indents the base of the bladder posteriorly. Other: No abdominopelvic ascites. Musculoskeletal: Mild to moderate superior endplate compression fracture deformity at L2, age indeterminate, although new from 2018 myelogram. No retropulsion. Degenerative changes of the visualized thoracolumbar spine. No focal osseous lesions. IMPRESSION: Suspected cystitis.  Indwelling 3.5 cm bladder calculus. Associated irregular bladder wall thickening is poorly evaluated on CT. However, given additional findings, cystoscopy is suggested to exclude primary bladder neoplasm. Mild right hydroureteronephrosis. No associated ureteral calculus on CT. Prostatomegaly in this patient with known prostate cancer. Small upper abdominal/retroperitoneal lymph nodes are similar to 2013, likely reactive. Correlate with PSA. Otherwise, no findings suspicious for metastatic disease. No focal osseous lesions. These results will be called to the ordering clinician or representative by the Radiologist Assistant, and communication documented in the PACS or zVision Dashboard. Electronically Signed   By: Julian Hy M.D.   On: 04/21/2019 16:46   US Renal  Result Date: 05/01/2019 CLINICAL DATA:  85 year old male with acute renal insufficiency. History of bladder stone removal. EXAM: RENAL / URINARY TRACT ULTRASOUND COMPLETE COMPARISON:  CT of the abdomen pelvis dated 04/21/2019. FINDINGS: Right Kidney: Renal measurements: 8.9 x 4.5 x 4.9 cm = volume: 102 mL. There is a 1 0 x 1.0 x 1.1 cm lower pole cyst. Normal echogenicity. Mild parenchyma atrophy. There is mild fullness of the upper pole collecting system similar to prior CT. No shadowing stone. Left Kidney: Renal measurements: 10.2 x 5.4 x 4.4 cm = volume: 127 mL. Normal echogenicity. There is mild parenchyma atrophy. There is a 9 mm echogenic focus in the interpolar aspect of the left kidney which may represent a nonobstructing stone. There is mild left hydronephrosis. Bladder:  The urinary bladder is collapsed. Multiple stones noted within the bladder measuring up to 1.7 cm. IMPRESSION: 1. Mild left hydronephrosis. A 9 mm nonobstructing left renal interpolar calculus noted. 2.  No stone in the right kidney. Mild fullness of the upper pole collecting system similar to prior CT. 3. Undistended urinary bladder containing multiple stones. Electronically Signed   By: Anner Crete M.D.   On: 05/01/2019 09:27   Dg Chest Port 1 View  Result Date: 04/30/2019 CLINICAL DATA:  Patient with history of urinary tract infection. Confusion. EXAM: PORTABLE CHEST 1 VIEW COMPARISON:  Chest radiograph 02/28/2015 FINDINGS: Monitoring leads overlie the patient. Stable cardiac and mediastinal contours. No consolidative pulmonary opacities. No pleural effusion or pneumothorax. IMPRESSION: No acute cardiopulmonary process. Electronically Signed   By: Lovey Newcomer M.D.   On: 04/30/2019 20:58    Time Spent in minutes  30   Lala Lund M.D on 05/01/2019 at 9:59 AM  To page go to [email or web address] - password [facility]

## 2019-05-02 DIAGNOSIS — N179 Acute kidney failure, unspecified: Secondary | ICD-10-CM | POA: Diagnosis not present

## 2019-05-02 DIAGNOSIS — N3000 Acute cystitis without hematuria: Secondary | ICD-10-CM | POA: Diagnosis not present

## 2019-05-02 DIAGNOSIS — G9340 Encephalopathy, unspecified: Secondary | ICD-10-CM | POA: Diagnosis not present

## 2019-05-02 DIAGNOSIS — N210 Calculus in bladder: Secondary | ICD-10-CM | POA: Diagnosis not present

## 2019-05-02 DIAGNOSIS — I10 Essential (primary) hypertension: Secondary | ICD-10-CM | POA: Diagnosis not present

## 2019-05-02 LAB — COMPREHENSIVE METABOLIC PANEL
ALT: 23 U/L (ref 0–44)
AST: 46 U/L — ABNORMAL HIGH (ref 15–41)
Albumin: 2.5 g/dL — ABNORMAL LOW (ref 3.5–5.0)
Alkaline Phosphatase: 53 U/L (ref 38–126)
Anion gap: 9 (ref 5–15)
BUN: 30 mg/dL — ABNORMAL HIGH (ref 8–23)
CO2: 23 mmol/L (ref 22–32)
Calcium: 7.7 mg/dL — ABNORMAL LOW (ref 8.9–10.3)
Chloride: 107 mmol/L (ref 98–111)
Creatinine, Ser: 2.51 mg/dL — ABNORMAL HIGH (ref 0.61–1.24)
GFR calc Af Amer: 25 mL/min — ABNORMAL LOW (ref >60)
GFR calc non Af Amer: 22 mL/min — ABNORMAL LOW (ref >60)
Glucose, Bld: 117 mg/dL — ABNORMAL HIGH (ref 70–99)
Potassium: 4.3 mmol/L (ref 3.5–5.1)
Sodium: 139 mmol/L (ref 135–145)
Total Bilirubin: 1.3 mg/dL — ABNORMAL HIGH (ref 0.3–1.2)
Total Protein: 5.1 g/dL — ABNORMAL LOW (ref 6.5–8.1)

## 2019-05-02 LAB — CBC WITH DIFFERENTIAL/PLATELET
Abs Immature Granulocytes: 0.04 10*3/uL (ref 0.00–0.07)
Basophils Absolute: 0 10*3/uL (ref 0.0–0.1)
Basophils Relative: 0 %
Eosinophils Absolute: 0 10*3/uL (ref 0.0–0.5)
Eosinophils Relative: 0 %
HCT: 41.4 % (ref 39.0–52.0)
Hemoglobin: 13.4 g/dL (ref 13.0–17.0)
Immature Granulocytes: 1 %
Lymphocytes Relative: 21 %
Lymphs Abs: 1.4 10*3/uL (ref 0.7–4.0)
MCH: 31.5 pg (ref 26.0–34.0)
MCHC: 32.4 g/dL (ref 30.0–36.0)
MCV: 97.4 fL (ref 80.0–100.0)
Monocytes Absolute: 0.3 10*3/uL (ref 0.1–1.0)
Monocytes Relative: 4 %
Neutro Abs: 5.1 10*3/uL (ref 1.7–7.7)
Neutrophils Relative %: 74 %
Platelets: 170 10*3/uL (ref 150–400)
RBC: 4.25 MIL/uL (ref 4.22–5.81)
RDW: 16.5 % — ABNORMAL HIGH (ref 11.5–15.5)
WBC: 6.9 10*3/uL (ref 4.0–10.5)
nRBC: 0 % (ref 0.0–0.2)

## 2019-05-02 LAB — PROCALCITONIN: Procalcitonin: 0.49 ng/mL

## 2019-05-02 LAB — URINE CULTURE: Culture: <10000 — AB

## 2019-05-02 LAB — BRAIN NATRIURETIC PEPTIDE: B Natriuretic Peptide: 164.2 pg/mL — ABNORMAL HIGH (ref 0.0–100.0)

## 2019-05-02 LAB — MAGNESIUM: Magnesium: 2 mg/dL (ref 1.7–2.4)

## 2019-05-02 MED ORDER — SODIUM CHLORIDE 0.9 % IV SOLN
INTRAVENOUS | Status: DC | PRN
  Administered 2019-05-02: 22:00:00 5 mL via INTRAVENOUS

## 2019-05-02 MED ORDER — LOPERAMIDE HCL 2 MG PO CAPS: 2.0000 mg | ORAL_CAPSULE | Freq: Four times a day (QID) | ORAL | Status: DC | PRN

## 2019-05-02 MED ORDER — ENOXAPARIN SODIUM 30 MG/0.3ML ~~LOC~~ SOLN
30.0000 mg | Freq: Every day | SUBCUTANEOUS | Status: DC
  Administered 2019-05-03 – 2019-05-04 (×2): 30 mg via SUBCUTANEOUS
  Filled 2019-05-02 (×2): qty 0.3

## 2019-05-02 NOTE — Progress Notes (Signed)
PROGRESS NOTE                                                                                                                                                                                                             Patient Demographics:    Barry Swanson, is a 85 y.o. male, DOB - November 22, 1928, LXB:[id number]  Admit date - 04/30/2019   Admitting Physician Elwyn Reach, MD  Outpatient Primary MD for the patient is [name], MD  LOS - 2  Chief Complaint  Patient presents with   generalized weakness       Brief Narrative  Barry Swanson is a 85 y.o. male with medical history significant of hypertension, CVA, hyperlipidemia, hepatitis B, chronic low back pain, pancreatic cancer and osteoarthritis who was recently diagnosed with UTI and bladder stone with UTI.  Patient given antibiotics to take at home, he was brought back with 3-day history of nausea vomiting, confusion, fever chills.  In the ER diagnosed with toxic encephalopathy, AKI, sepsis due to UTI and admitted.   Subjective:   Patient in bed, appears comfortable, denies any headache, no fever, no chest pain or pressure, no shortness of breath , no abdominal pain. No focal weakness.   Assessment  & Plan :     1.  Sepsis due to UTI.  UTI is complex as patient has multiple retained urinary bladder stones, mild left-sided hydronephrosis without any distinct obstruction, cultures pending, trend procalcitonin and clinically, continue IV fluids, sepsis pathophysiology has now resolved, urology has been consulted as discussed with Dr. Jeffie Pollock.  Follow serial PVRs, will monitor.  Blood cultures negative thus far.  Urine cultures inconclusive.  New trending pro calcitonin.     Ref. Range 05/01/2019 08:37 05/02/2019 03:06  Procalcitonin Latest Units: ng/mL 0.25 0.49    2.  Toxic encephalopathy.  Due to #1 above.  Supportive care.  3.  AKI.  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  Last creatinine is 4 years ago which is close to  1.5.  He could have easily progressed in the meantime, for now hydrate and monitor avoid nephrotoxins including ARB.  Creatinine seems to have plateaued for now we will continue to monitor.  4.  Hypokalemia.  Placed in stable.  5.  Essential hypertension.  Currently hypotensive, hydrate hold blood pressure medications PRN hydralazine.  6.  Dyslipidemia.  Placed on statin.  7.  BPH and history of prostate cancer.  Resume Flomax.  Urology on board.  Family Communication  :  Called wife Hassan Rowan 9/14 @ 10.14 am - left message  Code Status :  Full  Disposition Plan  :  Inpt, will require SNF  Consults  :  Urology  Procedures  :    CT - Suspected cystitis.  Indwelling 3.5 cm bladder calculus. Associated irregular bladder wall thickening is poorly evaluated on CT. However, given additional findings, cystoscopy is suggested to exclude primary bladder neoplasm. Mild right hydroureteronephrosis. No associated ureteral calculus on CT. Prostatomegaly in this patient with known prostate cancer. Small upper abdominal/retroperitoneal lymph nodes are similar to 2013, likely reactive. Correlate with PSA. Otherwise, no findings suspicious for metastatic disease. No focal osseous lesions.  Renal US - non acute  DVT Prophylaxis  :  Lovenox   Lab Results  Component Value Date   PLT 170 05/02/2019    Diet :  Diet Order            Diet Heart Room service appropriate? Yes; Fluid consistency: Thin  Diet effective now               Inpatient Medications Scheduled Meds:  aspirin  324 mg Oral Daily   enoxaparin (LOVENOX) injection  40 mg Subcutaneous Daily   finasteride  5 mg Oral Daily   simvastatin  10 mg Oral Daily   tamsulosin  0.4 mg Oral Daily   Continuous Infusions:  cefTRIAXone (ROCEPHIN)  IV 2 g (05/01/19 2124)   lactated ringers with kcl 100 mL/hr at 05/02/19 0414   PRN Meds:.hydrALAZINE, ondansetron **OR** ondansetron (ZOFRAN) IV  Antibiotics  :   Anti-infectives  (From admission, onward)   Start     Dose/Rate Route Frequency Ordered Stop   04/30/19 2100  cefTRIAXone (ROCEPHIN) 2 g in sodium chloride 0.9 % 100 mL IVPB     2 g 200 mL/hr over 30 Minutes Intravenous Every 24 hours 04/30/19 2030            Objective:   Vitals:   05/01/19 1558 05/01/19 2156 05/02/19 0616 05/02/19 0826  BP: (!) 147/68 104/61 114/70 114/75  Pulse: 89 66 72 68  Resp: (!) 22 16 16    Temp: 98.4 F (36.9 C) 99.4 F (37.4 C) 98.2 F (36.8 C) 98.4 F (36.9 C)  TempSrc: Oral Oral Oral Oral  SpO2: 97% 98% 98% 100%  Weight: 87.5 kg     Height: 5\' 7"  (1.702 m)       Wt Readings from Last 3 Encounters:  05/01/19 87.5 kg  04/17/15 94.2 kg  02/11/15 95.3 kg     Intake/Output Summary (Last 24 hours) at 05/02/2019 1122 Last data filed at 05/01/2019 1633 Gross per 24 hour  Intake 121.11 ml  Output 50 ml  Net 71.11 ml     Physical Exam  Awake still mildly confused but improved as compared to yesterday, no focal deficits .AT,PERRAL Supple Neck,No JVD, No cervical lymphadenopathy appriciated.  Symmetrical Chest wall movement, Good air movement bilaterally, CTAB RRR,No Gallops, Rubs or new Murmurs, No Parasternal Heave +ve B.Sounds, Abd Soft, No tenderness, No organomegaly appriciated, No rebound - guarding or rigidity. No Cyanosis, Clubbing or edema, No new Rash or bruise    Data Review:    CBC Recent Labs  Lab 04/30/19 2059 05/02/19 0306  WBC 5.8 6.9  HGB 13.9 13.4  HCT 43.2 41.4  PLT 230 170  MCV 96.9 97.4  MCH 31.2 31.5  MCHC 32.2 32.4  RDW 16.2* 16.5*  LYMPHSABS 0.6* 1.4  MONOABS 0.3 0.3  EOSABS 0.0 0.0  BASOSABS 0.0 0.0    Chemistries  Recent Labs  Lab 04/30/19 2059 05/01/19 0519 05/02/19 0306  NA 135 138 139  K 2.7* 3.3* 4.3  CL 101 105 107  CO2 24 20* 23  GLUCOSE 135* 110* 117*  BUN 30* 30* 30*  CREATININE 2.78* 2.76* 2.51*  CALCIUM 8.3* 8.1* 7.7*  MG  --   --  2.0  AST 17 26 46*  ALT 14 18 23   ALKPHOS 57 55 53    BILITOT 1.1 1.0 1.3*   ------------------------------------------------------------------------------------------------------------------ No results for input(s): CHOL, HDL, LDLCALC, TRIG, CHOLHDL, LDLDIRECT in the last 72 hours.  Lab Results  Component Value Date   HGBA1C 5.1 02/11/2015   ------------------------------------------------------------------------------------------------------------------ No results for input(s): TSH, T4TOTAL, T3FREE, THYROIDAB in the last 72 hours.  Invalid input(s): FREET3 ------------------------------------------------------------------------------------------------------------------ No results for input(s): VITAMINB12, FOLATE, FERRITIN, TIBC, IRON, RETICCTPCT in the last 72 hours.  Coagulation profile Recent Labs  Lab 04/30/19 2059  INR 1.3*    No results for input(s): DDIMER in the last 72 hours.  Cardiac Enzymes No results for input(s): CKMB, TROPONINI, MYOGLOBIN in the last 168 hours.  Invalid input(s): CK ------------------------------------------------------------------------------------------------------------------    Component Value Date/Time   BNP 164.2 (H) 05/02/2019 0306    Micro Results Recent Results (from the past 240 hour(s))  Blood Culture (routine x 2)     Status: None (Preliminary result)   Collection Time: 04/30/19  8:59 PM   Specimen: BLOOD  Result Value Ref Range Status   Specimen Description BLOOD RIGHT WRIST  Final   Special Requests   Final    BOTTLES DRAWN AEROBIC ONLY Blood Culture results may not be optimal due to an inadequate volume of blood received in culture bottles   Culture   Final    NO GROWTH 2 DAYS Performed at [facility], [place] [address], [place], [place] [address]    Report Status PENDING  Incomplete  Urine culture     Status: Abnormal   Collection Time: 04/30/19  8:59 PM   Specimen: In/Out Cath Urine  Result Value Ref Range Status   Specimen Description IN/OUT CATH URINE  Final    Special Requests NONE  Final   Culture (A)  Final    <10,000 COLONIES/mL INSIGNIFICANT GROWTH Performed at [facility], [place] [address], [place], [place] [address]    Report Status 05/02/2019 FINAL  Final  SARS Coronavirus 2 [facility] order, Performed in [facility] hospital lab) Nasopharyngeal Nasopharyngeal Swab     Status: None   Collection Time: 04/30/19  9:01 PM   Specimen: Nasopharyngeal Swab  Result Value Ref Range Status   SARS Coronavirus 2 NEGATIVE NEGATIVE Final    Comment: (NOTE) If result is NEGATIVE SARS-CoV-2 target nucleic acids are NOT DETECTED. The SARS-CoV-2 RNA is generally detectable in upper and lower  respiratory specimens during the acute phase of infection. The lowest  concentration of SARS-CoV-2 viral copies this assay can detect is 250  copies / mL. A negative result does not preclude SARS-CoV-2 infection  and should not be used as the sole basis for treatment or other  patient management decisions.  A negative result may occur with  improper specimen collection / handling, submission of specimen other  than nasopharyngeal swab, presence of viral mutation(s) within the  areas targeted by this assay, and inadequate number of viral copies  (<250 copies / mL). A negative result must be combined with clinical  observations, patient history,  and epidemiological information. If result is POSITIVE SARS-CoV-2 target nucleic acids are DETECTED. The SARS-CoV-2 RNA is generally detectable in upper and lower  respiratory specimens dur ing the acute phase of infection.  Positive  results are indicative of active infection with SARS-CoV-2.  Clinical  correlation with patient history and other diagnostic information is  necessary to determine patient infection status.  Positive results do  not rule out bacterial infection or co-infection with other viruses. If result is PRESUMPTIVE POSTIVE SARS-CoV-2 nucleic acids MAY BE PRESENT.   A presumptive positive result  was obtained on the submitted specimen  and confirmed on repeat testing.  While 2019 novel coronavirus  (SARS-CoV-2) nucleic acids may be present in the submitted sample  additional confirmatory testing may be necessary for epidemiological  and / or clinical management purposes  to differentiate between  SARS-CoV-2 and other Sarbecovirus currently known to infect humans.  If clinically indicated additional testing with an alternate test  methodology [phone]) is advised. The SARS-CoV-2 RNA is generally  detectable in upper and lower respiratory sp ecimens during the acute  phase of infection. The expected result is Negative. Fact Sheet for Patients:  StrictlyIdeas.no Fact Sheet for Healthcare Providers: BankingDealers.co.za This test is not yet approved or cleared by the [place] FDA and has been authorized for detection and/or diagnosis of SARS-CoV-2 by FDA under an Emergency Use Authorization (EUA).  This EUA will remain in effect (meaning this test can be used) for the duration of the COVID-19 declaration under Section 564(b)(1) of the Act, 21 U.S.C. section 360bbb-3(b)(1), unless the authorization is terminated or revoked sooner. Performed at [facility], [place] [address] [place], [place] [address]   Blood Culture (routine x 2)     Status: None (Preliminary result)   Collection Time: 04/30/19  9:04 PM   Specimen: BLOOD  Result Value Ref Range Status   Specimen Description BLOOD LEFT WRIST  Final   Special Requests   Final    BOTTLES DRAWN AEROBIC AND ANAEROBIC Blood Culture results may not be optimal due to an inadequate volume of blood received in culture bottles   Culture   Final    NO GROWTH 2 DAYS Performed at [facility], [place] [address] [place], [place] [address]    Report Status PENDING  Incomplete    Radiology Reports Ct Abdomen Pelvis Wo Contrast  Result Date: 04/21/2019 CLINICAL DATA:  Vomiting,  weight loss, history of prostate cancer status post TURP EXAM: CT ABDOMEN AND PELVIS WITHOUT CONTRAST TECHNIQUE: Multidetector CT imaging of the abdomen and pelvis was performed following the standard protocol without IV contrast. COMPARISON:  CT abdomen/pelvis dated 06/03/2012 FINDINGS: Lower chest: 3 mm subpleural nodule in the right lower lobe (series 9/image 8), unchanged, benign. Hepatobiliary: Unenhanced liver is unremarkable. Gallbladder is unremarkable. No intrahepatic or extrahepatic ductal dilatation. Pancreas: Within normal limits. Spleen: Within normal limits. Adrenals/Urinary Tract: Adrenal glands are within normal limits. Left kidney is notable for a 16 mm benign hemorrhagic cysts in the lateral left upper pole (series 2/image 43). Additional 13 mm simple cyst in the posterior left lower kidney (series 2/image 47). 2 mm nonobstructing left lower pole renal calculus (series 2/image 51). No hydronephrosis. Right kidney is notable for mild right hydronephrosis with prominent extrarenal pelvis. No obstructing ureteral calculus is seen. Bladder wall thickening with trabeculation and perivesical stranding, suggesting cystitis, although an underlying bladder neoplasm along the right posterolateral bladder is not excluded (series 2/image 80). Additional irregular bladder wall thickening  along the left superior bladder dome (series 2/image 36). Associated 3.5 cm bladder calculus. Stomach/Bowel: Contrast within the mid/distal esophagus, nonspecific but possibly reflecting gastroesophageal reflux. Stomach is within normal limits. No evidence of bowel obstruction. Small to moderate duodenal diverticulum (series 2/image 43). Appendix is not discretely visualized. Left colonic diverticulosis. Adjacent pericolonic stranding along the sigmoid colon is favored to be related to the bladder inflammation rather than sigmoid diverticulitis. Vascular/Lymphatic: No evidence of abdominal aortic aneurysm. Atherosclerotic  calcifications of the abdominal aorta and branch vessels. Small upper abdominal and retroperitoneal lymph nodes, including a dominant 9 mm short axis inferior aortocaval node (series 2/image 55). These findings are similar to 2013 and therefore likely reactive. Reproductive: Prostatomegaly in this patient with known prostate cancer. Enlarged central gland indents the base of the bladder posteriorly. Other: No abdominopelvic ascites. Musculoskeletal: Mild to moderate superior endplate compression fracture deformity at L2, age indeterminate, although new from 2018 myelogram. No retropulsion. Degenerative changes of the visualized thoracolumbar spine. No focal osseous lesions. IMPRESSION: Suspected cystitis.  Indwelling 3.5 cm bladder calculus. Associated irregular bladder wall thickening is poorly evaluated on CT. However, given additional findings, cystoscopy is suggested to exclude primary bladder neoplasm. Mild right hydroureteronephrosis. No associated ureteral calculus on CT. Prostatomegaly in this patient with known prostate cancer. Small upper abdominal/retroperitoneal lymph nodes are similar to 2013, likely reactive. Correlate with PSA. Otherwise, no findings suspicious for metastatic disease. No focal osseous lesions. These results will be called to the ordering clinician or representative by the Radiologist Assistant, and communication documented in the PACS or zVision Dashboard. Electronically Signed   By: Julian Hy M.D.   On: 04/21/2019 16:46   US Renal  Result Date: 05/01/2019 CLINICAL DATA:  85 year old male with acute renal insufficiency. History of bladder stone removal. EXAM: RENAL / URINARY TRACT ULTRASOUND COMPLETE COMPARISON:  CT of the abdomen pelvis dated 04/21/2019. FINDINGS: Right Kidney: Renal measurements: 8.9 x 4.5 x 4.9 cm = volume: 102 mL. There is a 1 0 x 1.0 x 1.1 cm lower pole cyst. Normal echogenicity. Mild parenchyma atrophy. There is mild fullness of the upper pole  collecting system similar to prior CT. No shadowing stone. Left Kidney: Renal measurements: 10.2 x 5.4 x 4.4 cm = volume: 127 mL. Normal echogenicity. There is mild parenchyma atrophy. There is a 9 mm echogenic focus in the interpolar aspect of the left kidney which may represent a nonobstructing stone. There is mild left hydronephrosis. Bladder: The urinary bladder is collapsed. Multiple stones noted within the bladder measuring up to 1.7 cm. IMPRESSION: 1. Mild left hydronephrosis. A 9 mm nonobstructing left renal interpolar calculus noted. 2. No stone in the right kidney. Mild fullness of the upper pole collecting system similar to prior CT. 3. Undistended urinary bladder containing multiple stones. Electronically Signed   By: Anner Crete M.D.   On: 05/01/2019 09:27   Dg Chest Port 1 View  Result Date: 04/30/2019 CLINICAL DATA:  Patient with history of urinary tract infection. Confusion. EXAM: PORTABLE CHEST 1 VIEW COMPARISON:  Chest radiograph 02/28/2015 FINDINGS: Monitoring leads overlie the patient. Stable cardiac and mediastinal contours. No consolidative pulmonary opacities. No pleural effusion or pneumothorax. IMPRESSION: No acute cardiopulmonary process. Electronically Signed   By: Lovey Newcomer M.D.   On: 04/30/2019 20:58    Time Spent in minutes  30   Lala Lund M.D on 05/02/2019 at 11:22 AM  To page go to [email or web address] - password [facility]

## 2019-05-02 NOTE — Care Management Important Message (Signed)
Important Message  Patient Details  Name: Barry Swanson MRN: [id number] Date of Birth: 26-May-1929   Medicare Important Message Given:  Yes     Rosmery Duggin 05/02/2019, 4:06 PM

## 2019-05-02 NOTE — Evaluation (Signed)
Physical Therapy Evaluation Patient Details Name: Barry Swanson MRN: [id number] DOB: 1928/11/18 Today's Date: 05/02/2019   History of Present Illness  85 y.o. male with medical history significant of hypertension, CVA, hyperlipidemia, hepatitis B, chronic low back pain, pancreatic cancer and osteoarthritis who was recently diagnosed with UTI and bladder stone with UTI.  Patient given antibiotics to take at home, he was brought back with 3-day history of nausea vomiting, confusion, fever chills.  In the ER diagnosed with toxic encephalopathy, AKI, sepsis due to UTI  Clinical Impression  Pt admitted with above diagnosis. Mod assist for supine to sit, min assist for bed to recliner transfer with RW, activity tolerance limited by fatigue. Pt's wife stated caring for pt at home had become challenging, she is open to ST-SNF, but wants to think about it.  Pt currently with functional limitations due to the deficits listed below (see PT Problem List). Pt will benefit from skilled PT to increase their independence and safety with mobility to allow discharge to the venue listed below.       Follow Up Recommendations SNF    Equipment Recommendations  None recommended by PT    Recommendations for Other Services       Precautions / Restrictions Precautions Precautions: Fall Precaution Comments: pt reports "a couple falls in past couple years, none recently" Restrictions Weight Bearing Restrictions: No      Mobility  Bed Mobility Overal bed mobility: Needs Assistance Bed Mobility: Supine to Sit     Supine to sit: Mod assist     General bed mobility comments: assist to raise trunk and pivot hips to EOB  Transfers Overall transfer level: Needs assistance Equipment used: Rolling walker (2 wheeled) Transfers: Sit to/from [employer] Transfers Sit to Stand: From elevated surface;Min assist Stand pivot transfers: Min guard       General transfer comment: assist to  rise/steady  Ambulation/Gait Ambulation/Gait assistance: Min guard Gait Distance (Feet): 3 Feet Assistive device: Rolling walker (2 wheeled) Gait Pattern/deviations: Step-to pattern;Decreased stride length Gait velocity: decr   General Gait Details: several pivotal steps from bed to recliner with RW, VCs for positioning in RW, no LOB  Stairs            Wheelchair Mobility    Modified Rankin (Stroke Patients Only)       Balance Overall balance assessment: Needs assistance Sitting-balance support: Feet supported;No upper extremity supported Sitting balance-Leahy Scale: Good     Standing balance support: Bilateral upper extremity supported Standing balance-Leahy Scale: Poor Standing balance comment: relies on BUE support                             Pertinent Vitals/Pain Pain Assessment: No/denies pain Pain Location: no pain at rest, pt reports pain with movement but unable to specify location nor intensity Pain Descriptors / Indicators: Grimacing Pain Intervention(s): Limited activity within patient's tolerance;Monitored during session    Home Living Family/patient expects to be discharged to:: Private residence Living Arrangements: Spouse/significant other Available Help at Discharge: Family;Available 24 hours/day Type of Home: House Home Access: Stairs to enter Entrance Stairs-Rails: None Entrance Stairs-Number of Steps: 2 Home Layout: One level Home Equipment: Walker - 4 wheels;Walker - 2 wheels      Prior Function Level of Independence: Independent with assistive device(s);Needs assistance         Comments: independent with rollator; assist for bathing/dressing     Hand Dominance  Extremity/Trunk Assessment   Upper Extremity Assessment Upper Extremity Assessment: Overall WFL for tasks assessed    Lower Extremity Assessment Lower Extremity Assessment: Overall WFL for tasks assessed    Cervical / Trunk Assessment Cervical  / Trunk Assessment: Normal  Communication   Communication: HOH  Cognition Arousal/Alertness: Awake/alert Behavior During Therapy: WFL for tasks assessed/performed Overall Cognitive Status: Within Functional Limits for tasks assessed                                 General Comments: pt had several crying episodes during session, he said he didn't know why he was sad      General Comments      Exercises     Assessment/Plan    PT Assessment Patient needs continued PT services  PT Problem List Decreased activity tolerance;Decreased mobility;Decreased balance       PT Treatment Interventions DME instruction;Gait training;Functional mobility training;Therapeutic activities;Therapeutic exercise;Balance training;Patient/family education    PT Goals (Current goals can be found in the Care Plan section)  Acute Rehab PT Goals Patient Stated Goal: to get stronger PT Goal Formulation: With patient/family Time For Goal Achievement: 05/16/19 Potential to Achieve Goals: Good    Frequency Min 3X/week   Barriers to discharge        Co-evaluation               AM-PAC PT "6 Clicks" Mobility  Outcome Measure Help needed turning from your back to your side while in a flat bed without using bedrails?: A Lot Help needed moving from lying on your back to sitting on the side of a flat bed without using bedrails?: A Lot Help needed moving to and from a bed to a chair (including a wheelchair)?: A Lot Help needed standing up from a chair using your arms (e.g., wheelchair or bedside chair)?: A Lot Help needed to walk in hospital room?: A Lot Help needed climbing 3-5 steps with a railing? : Total 6 Click Score: 11    End of Session Equipment Utilized During Treatment: Gait belt;Oxygen Activity Tolerance: Patient tolerated treatment well Patient left: in chair;with call bell/phone within reach;with family/visitor present Nurse Communication: Mobility status PT Visit  Diagnosis: Difficulty in walking, not elsewhere classified (R26.2)    Time: 2426-8341 PT Time Calculation (min) (ACUTE ONLY): 29 min   Charges:   PT Evaluation $PT Eval Moderate Complexity: 1 Mod PT Treatments $Therapeutic Activity: 8-22 mins       Blondell Reveal Kistler PT 05/02/2019  Acute Rehabilitation Services Pager [phone] Office [phone]

## 2019-05-02 NOTE — Progress Notes (Signed)
CSW spoke with patient/patient's wife to assess for discharge planning needs but patient's wife stated she preferred to speak with CSW tomorrow morning due to needing to rush home to take care of their sick pet. CSW will follow up.  Percell Locus Estel Scholze LCSW [phone]

## 2019-05-03 ENCOUNTER — Inpatient Hospital Stay (HOSPITAL_COMMUNITY): Payer: Medicare Other

## 2019-05-03 DIAGNOSIS — N3000 Acute cystitis without hematuria: Secondary | ICD-10-CM | POA: Diagnosis not present

## 2019-05-03 DIAGNOSIS — N179 Acute kidney failure, unspecified: Secondary | ICD-10-CM | POA: Diagnosis not present

## 2019-05-03 DIAGNOSIS — N210 Calculus in bladder: Secondary | ICD-10-CM | POA: Diagnosis not present

## 2019-05-03 DIAGNOSIS — I10 Essential (primary) hypertension: Secondary | ICD-10-CM | POA: Diagnosis not present

## 2019-05-03 DIAGNOSIS — M25551 Pain in right hip: Secondary | ICD-10-CM | POA: Diagnosis not present

## 2019-05-03 DIAGNOSIS — G9340 Encephalopathy, unspecified: Secondary | ICD-10-CM | POA: Diagnosis not present

## 2019-05-03 LAB — COMPREHENSIVE METABOLIC PANEL
ALT: 27 U/L (ref 0–44)
AST: 26 U/L (ref 15–41)
Albumin: 2.3 g/dL — ABNORMAL LOW (ref 3.5–5.0)
Alkaline Phosphatase: 47 U/L (ref 38–126)
Anion gap: 7 (ref 5–15)
BUN: 27 mg/dL — ABNORMAL HIGH (ref 8–23)
CO2: 23 mmol/L (ref 22–32)
Calcium: 8 mg/dL — ABNORMAL LOW (ref 8.9–10.3)
Chloride: 110 mmol/L (ref 98–111)
Creatinine, Ser: 2.02 mg/dL — ABNORMAL HIGH (ref 0.61–1.24)
GFR calc Af Amer: 33 mL/min — ABNORMAL LOW (ref >60)
GFR calc non Af Amer: 29 mL/min — ABNORMAL LOW (ref >60)
Glucose, Bld: 95 mg/dL (ref 70–99)
Potassium: 4 mmol/L (ref 3.5–5.1)
Sodium: 140 mmol/L (ref 135–145)
Total Bilirubin: 0.6 mg/dL (ref 0.3–1.2)
Total Protein: 4.5 g/dL — ABNORMAL LOW (ref 6.5–8.1)

## 2019-05-03 LAB — PROCALCITONIN: Procalcitonin: 0.38 ng/mL

## 2019-05-03 LAB — CBC WITH DIFFERENTIAL/PLATELET
Abs Immature Granulocytes: 0.01 10*3/uL (ref 0.00–0.07)
Basophils Absolute: 0 10*3/uL (ref 0.0–0.1)
Basophils Relative: 1 %
Eosinophils Absolute: 0.2 10*3/uL (ref 0.0–0.5)
Eosinophils Relative: 4 %
HCT: 39.3 % (ref 39.0–52.0)
Hemoglobin: 12.3 g/dL — ABNORMAL LOW (ref 13.0–17.0)
Immature Granulocytes: 0 %
Lymphocytes Relative: 22 %
Lymphs Abs: 1.1 10*3/uL (ref 0.7–4.0)
MCH: 30.8 pg (ref 26.0–34.0)
MCHC: 31.3 g/dL (ref 30.0–36.0)
MCV: 98.5 fL (ref 80.0–100.0)
Monocytes Absolute: 0.4 10*3/uL (ref 0.1–1.0)
Monocytes Relative: 8 %
Neutro Abs: 3.3 10*3/uL (ref 1.7–7.7)
Neutrophils Relative %: 65 %
Platelets: 169 10*3/uL (ref 150–400)
RBC: 3.99 MIL/uL — ABNORMAL LOW (ref 4.22–5.81)
RDW: 16.4 % — ABNORMAL HIGH (ref 11.5–15.5)
WBC: 4.9 10*3/uL (ref 4.0–10.5)
nRBC: 0 % (ref 0.0–0.2)

## 2019-05-03 LAB — BRAIN NATRIURETIC PEPTIDE: B Natriuretic Peptide: 202.8 pg/mL — ABNORMAL HIGH (ref 0.0–100.0)

## 2019-05-03 LAB — MAGNESIUM: Magnesium: 2 mg/dL (ref 1.7–2.4)

## 2019-05-03 MED ORDER — MORPHINE SULFATE (PF) 2 MG/ML IV SOLN
2.0000 mg | Freq: Once | INTRAVENOUS | Status: AC
  Administered 2019-05-03: 2 mg via INTRAVENOUS
  Filled 2019-05-03: qty 1

## 2019-05-03 NOTE — TOC Initial Note (Addendum)
Transition of Care [facility]) - Initial/Assessment Note    Patient Details  Name: Barry Swanson MRN: [id number] Date of Birth: 03-Nov-1928  Transition of Care [facility]) CM/SW Contact:    Ralph Dowdy, [place] Work Phone Number: 05/03/2019, 11:48 AM  Clinical Narrative:                 CSW spoke with pt and spouse about SNF placement. They declined SNF and feel that pt will be able to manage at home. They are interested in home health services but have never used home health before. Pt stated that he has walker at home and does not need other equipment at this time. CSW to contact home health agencies and continue to follow up.   Expected Discharge Plan: [place] Barriers to Discharge: Continued Medical Work up   Patient Goals and CMS Choice Patient states their goals for this hospitalization and ongoing recovery are:: To feel better and go home. CMS Medicare.gov Compare Post Acute Care list provided to:: Patient Choice offered to / list presented to : Patient, Spouse  Expected Discharge Plan and Services Expected Discharge Plan: [place] In-house Referral: Clinical Social Work Discharge Planning Services: NA Post Acute Care Choice: [place] arrangements for the past 2 months: Single Family Home                 DME Arranged: Environmental consultant                    Prior Living Arrangements/Services Living arrangements for the past 2 months: Single Family Home Lives with:: Spouse Patient language and need for interpreter reviewed:: Yes Do you feel safe going back to the place where you live?: Yes      Need for Family Participation in Patient Care: Yes (Comment) Care giver support system in place?: Yes (comment) Current home services: Home PT, Home OT, Homehealth aide Criminal Activity/Legal Involvement Pertinent to Current Situation/Hospitalization: No - Comment as needed  Activities of Daily Living      Permission  Sought/Granted Permission sought to share information with : Family Supports Permission granted to share information with : Yes, Verbal Permission Granted  Share Information with NAME: Hassan Rowan and pt.     Permission granted to share info w Relationship: Spouse     Emotional Assessment Appearance:: Appears stated age Attitude/Demeanor/Rapport: Gracious, Engaged, Self-Confident, Reactive Affect (typically observed): Calm, Stable, Accepting, Happy Orientation: : Oriented to Self, Oriented to Place, Oriented to  Time, Oriented to Situation Alcohol / Substance Use: Not Applicable Psych Involvement: No (comment)  Admission diagnosis:  Hypokalemia [E87.6] ARF (acute renal failure) ([place]) [N17.9] Acute cystitis without hematuria [N30.00] Sepsis with acute renal failure without septic shock, due to unspecified organism, unspecified acute renal failure type ([place]) [A41.9, R65.20, N17.9] Patient Active Problem List   Diagnosis Date Noted  . Sepsis ([place]) 04/30/2019  . Acute pyelonephritis 04/30/2019  . Acute kidney injury ([place]) 04/30/2019  . Confusion   . Essential hypertension   . Acute encephalopathy 02/11/2015  . TIA (transient ischemic attack) 02/11/2015  . Hypertension   . Hyperlipidemia   . Calculus (=stone) 11/29/2012  . Malignant neoplasm of prostate ([place]) 11/29/2012  . Bladder outflow obstruction 11/29/2012   PCP:  [name], MD Pharmacy:   [facility], [address] [place] [address] Phone: [phone] Fax: [phone]     Social Determinants of Health (SDOH)  Interventions    Readmission Risk Interventions No flowsheet data found.

## 2019-05-03 NOTE — Progress Notes (Signed)
Patient passed two more small stones, after viding to his urinal. MD. singh was notify and sample was placed on the table. No complaints of pain or discomfort when asked.

## 2019-05-03 NOTE — Progress Notes (Signed)
OT Cancellation Note  Patient Details Name: Aaren Atallah MRN: [id number] DOB: [birth year]   Cancelled Treatment:    Reason Eval/Treat Not Completed: Patient at procedure or test/ unavailable. Plan to reattempt in a bit.  Tyrone Schimke, OT Acute Rehabilitation Services Pager: [phone] Office: [phone]  05/03/2019, 9:10 AM

## 2019-05-03 NOTE — Progress Notes (Signed)
Notified by NT that pt had passed two large stones after urination.  Two large stones noted in the patient's urinal.  Stones collected in specimen cup and labeled.  Paged [facility] provider Tylene Fantasia, NP to request something for pain; 2mg  morphine IV ordered and administered.

## 2019-05-03 NOTE — Plan of Care (Signed)

## 2019-05-03 NOTE — Evaluation (Signed)
Occupational Therapy Evaluation Patient Details Name: Barry Swanson MRN: [id number] DOB: [birth year] Today's Date: 05/03/2019    History of Present Illness 85 y.o. male with medical history significant of hypertension, CVA, hyperlipidemia, hepatitis B, chronic low back pain, pancreatic cancer and osteoarthritis who was recently diagnosed with UTI and bladder stone with UTI.  Patient given antibiotics to take at home, he was brought back with 3-day history of nausea vomiting, confusion, fever chills.  In the ER diagnosed with toxic encephalopathy, AKI, sepsis due to UTI   Clinical Impression   Pt PTA: Pt living with spouse and she is very supportive and assists pt as needed. Pt currently performing ADL functional mobility with RW and minguardA to minA. Pt minA overall for ADL. Pt seated for all ADL tasks as pt fatigues easily. Pt close to baseline for mobility and ADL pt is interested in [facility]. Pt requires continuous OT skilled services for energy conservation and ADL tasks. OT following acutely.       Follow Up Recommendations  Home health OT;Supervision - Intermittent    Equipment Recommendations  None recommended by OT    Recommendations for Other Services       Precautions / Restrictions Precautions Precautions: Fall Precaution Comments: pt reports "a couple falls in past couple years, none recently" Restrictions Weight Bearing Restrictions: No      Mobility Bed Mobility Overal bed mobility: Needs Assistance Bed Mobility: Supine to Sit;Sit to Supine     Supine to sit: Mod assist Sit to supine: Mod assist   General bed mobility comments: assist to raise trunk and pivot hips to EOB  Transfers Overall transfer level: Needs assistance Equipment used: Rolling walker (2 wheeled) Transfers: Sit to/from [facility] Sit to Stand: Min guard;Min assist Stand pivot transfers: Min guard;Min assist       General transfer comment: assist to rise/steady     Balance Overall balance assessment: Needs assistance   Sitting balance-Leahy Scale: Good       Standing balance-Leahy Scale: Poor                             ADL either performed or assessed with clinical judgement   ADL Overall ADL's : Needs assistance/impaired Eating/Feeding: Modified independent;Sitting   Grooming: Set up;Sitting   Upper Body Bathing: Minimal assistance;Sitting   Lower Body Bathing: Maximal assistance;Sitting/lateral leans;Sit to/from stand   Upper Body Dressing : Minimal assistance;Sitting   Lower Body Dressing: Maximal assistance;Sitting/lateral leans;Sit to/from stand   Toilet Transfer: Minimal assistance;Stand-pivot   Toileting- Clothing Manipulation and Hygiene: Maximal assistance;Sitting/lateral lean;Sit to/from stand       Functional mobility during ADLs: Min guard;Minimal assistance;Rolling walker;Cueing for safety General ADL Comments: Pt sitting for grooming and toileting with set-upA.     Vision Baseline Vision/History: No visual deficits Vision Assessment?: No apparent visual deficits     Perception     Praxis      Pertinent Vitals/Pain Pain Assessment: Faces Faces Pain Scale: Hurts a little bit Pain Location: no pain at rest, pt reports pain with movement but unable to specify location nor intensity Pain Descriptors / Indicators: Grimacing Pain Intervention(s): Limited activity within patient's tolerance     Hand Dominance Right   Extremity/Trunk Assessment Upper Extremity Assessment Upper Extremity Assessment: Overall WFL for tasks assessed   Lower Extremity Assessment Lower Extremity Assessment: Generalized weakness   Cervical / Trunk Assessment Cervical / Trunk Assessment: Normal   Communication Communication Communication: [facility]  Cognition Arousal/Alertness: Awake/alert Behavior During Therapy: WFL for tasks assessed/performed Overall Cognitive Status: Within Functional Limits for tasks assessed                                  General Comments: Pt was not upset this session; he was annoyed momentarily that he was dizzy and nauseous when sitting EOB.   General Comments       Exercises     Shoulder Instructions      Home Living Family/patient expects to be discharged to:: Private residence Living Arrangements: Spouse/significant other Available Help at Discharge: Family;Available 24 hours/day Type of Home: House Home Access: Stairs to enter [employer] of Steps: 2 Entrance Stairs-Rails: None Home Layout: One level     Bathroom Shower/Tub: Occupational psychologist: Handicapped height     Home Equipment: Environmental consultant - 4 wheels;Walker - 2 wheels;Shower seat - built in          Prior Functioning/Environment Level of Independence: Needs assistance  Gait / Transfers Assistance Needed: mobility with RW  ADL's / Homemaking Assistance Needed: spouse assists with all ADL tasks             OT Problem List: Decreased strength;Decreased activity tolerance;Impaired balance (sitting and/or standing);Decreased coordination;Decreased safety awareness;Impaired UE functional use;Pain      OT Treatment/Interventions: Self-care/ADL training;Therapeutic exercise;Neuromuscular education;Energy conservation;DME and/or AE instruction;Therapeutic activities;Visual/perceptual remediation/compensation;Patient/family education;Balance training    OT Goals(Current goals can be found in the care plan section) Acute Rehab OT Goals Patient Stated Goal: to get stronger OT Goal Formulation: With patient Time For Goal Achievement: 05/17/19 Potential to Achieve Goals: Good ADL Goals Pt Will Perform Grooming: with supervision Pt Will Perform Upper Body Dressing: with supervision Pt Will Perform Lower Body Dressing: with supervision Pt Will Perform Toileting - Clothing Manipulation and hygiene: with min guard assist;sit to/from stand Additional ADL Goal #1: Pt will  increase OOB ADL x6 mins with minguardA.  OT Frequency: Min 2X/week   Barriers to D/C:            Co-evaluation              AM-PAC OT "6 Clicks" Daily Activity     Outcome Measure Help from another person eating meals?: None Help from another person taking care of personal grooming?: A Little Help from another person toileting, which includes using toliet, bedpan, or urinal?: A Little Help from another person bathing (including washing, rinsing, drying)?: A Lot Help from another person to put on and taking off regular upper body clothing?: A Little Help from another person to put on and taking off regular lower body clothing?: A Lot 6 Click Score: 17   End of Session Equipment Utilized During Treatment: Gait belt;Rolling walker Nurse Communication: Mobility status  Activity Tolerance: Patient tolerated treatment well Patient left: in bed;with call bell/phone within reach;with bed alarm set  OT Visit Diagnosis: Unsteadiness on feet (R26.81);Muscle weakness (generalized) (M62.81)                Time: 3419-6222 OT Time Calculation (min): 45 min Charges:  OT General Charges $OT Visit: 1 Visit OT Evaluation $OT Eval Moderate Complexity: 1 Mod OT Treatments $Self Care/Home Management : 23-37 mins  Ebony Hail Harold Hedge) Marsa Aris OTR/L Acute Rehabilitation Services Pager: [phone] Office: [place] 05/03/2019, 4:01 PM

## 2019-05-03 NOTE — Progress Notes (Signed)
PROGRESS NOTE                                                                                                                                                                                                             Patient Demographics:    Barry Swanson, is a 85 y.o. male, DOB - 08-Jul-1929, KVQ:[id number]  Admit date - 04/30/2019   Admitting Physician Elwyn Reach, MD  Outpatient Primary MD for the patient is [name], MD  LOS - 3  Chief Complaint  Patient presents with   generalized weakness       Brief Narrative  Barry Swanson is a 85 y.o. male with medical history significant of hypertension, CVA, hyperlipidemia, hepatitis B, chronic low back pain, pancreatic cancer and osteoarthritis who was recently diagnosed with UTI and bladder stone with UTI.  Patient given antibiotics to take at home, he was brought back with 3-day history of nausea vomiting, confusion, fever chills.  In the ER diagnosed with toxic encephalopathy, AKI, sepsis due to UTI and admitted.   Subjective:   Patient in bed, appears comfortable, denies any headache, no fever, no chest pain or pressure, no shortness of breath , no abdominal pain. No focal weakness.   Assessment  & Plan :     1.  Sepsis due to UTI.  UTI is complex as patient has multiple retained urinary bladder stones, mild left-sided hydronephrosis without any distinct obstruction, cultures pending, trend procalcitonin and clinically, continue IV fluids, sepsis pathophysiology has now resolved, urology has been consulted as discussed with Dr. Jeffie Pollock.  Follow serial PVRs, will monitor.  Blood cultures negative thus far.  Urine cultures inconclusive.  New trending pro calcitonin.      Ref. Range 05/01/2019 08:37 05/02/2019 03:06 05/03/2019 02:40  Procalcitonin Latest Units: ng/mL 0.25 0.49 0.38     2.  Toxic encephalopathy.  Due to #1 above.  Much improved with supportive care..  3.  AKI.  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  Last  creatinine is 4 years ago which is close to 1.5.  He could have easily progressed in the meantime, for now hydrate and monitor avoid nephrotoxins including ARB.  Creatinine seems to have plateaued for now we will continue to monitor.  4.  Hypokalemia.  Placed in stable.  5.  Essential hypertension.  Currently hypotensive, hydrate hold blood pressure medications PRN hydralazine.  6.  Dyslipidemia.  Placed on statin.  7.  BPH and history of prostate cancer.  Resume Flomax.  Urology on board.     Family Communication  :  Called wife Hassan Rowan 9/14 @ 10.14 am - left message  Code Status :  Full  Disposition Plan  :  Inpt, will require SNF  Consults  :  Urology  Procedures  :    CT - Suspected cystitis.  Indwelling 3.5 cm bladder calculus. Associated irregular bladder wall thickening is poorly evaluated on CT. However, given additional findings, cystoscopy is suggested to exclude primary bladder neoplasm. Mild right hydroureteronephrosis. No associated ureteral calculus on CT. Prostatomegaly in this patient with known prostate cancer. Small upper abdominal/retroperitoneal lymph nodes are similar to 2013, likely reactive. Correlate with PSA. Otherwise, no findings suspicious for metastatic disease. No focal osseous lesions.  Renal US - non acute  DVT Prophylaxis  :  Lovenox   Lab Results  Component Value Date   PLT 169 05/03/2019    Diet :  Diet Order            Diet Heart Room service appropriate? No; Fluid consistency: Thin  Diet effective now               Inpatient Medications Scheduled Meds:  aspirin  324 mg Oral Daily   enoxaparin (LOVENOX) injection  30 mg Subcutaneous Daily   finasteride  5 mg Oral Daily   simvastatin  10 mg Oral Daily   tamsulosin  0.4 mg Oral Daily   Continuous Infusions:  sodium chloride Stopped (05/03/19 0023)   cefTRIAXone (ROCEPHIN)  IV Stopped (05/02/19 2300)   lactated ringers with kcl 100 mL/hr at 05/03/19 0412   PRN  Meds:.sodium chloride, hydrALAZINE, loperamide, ondansetron **OR** ondansetron (ZOFRAN) IV  Antibiotics  :   Anti-infectives (From admission, onward)   Start     Dose/Rate Route Frequency Ordered Stop   04/30/19 2100  cefTRIAXone (ROCEPHIN) 2 g in sodium chloride 0.9 % 100 mL IVPB     2 g 200 mL/hr over 30 Minutes Intravenous Every 24 hours 04/30/19 2030            Objective:   Vitals:   05/02/19 0826 05/02/19 1252 05/02/19 2123 05/03/19 0525  BP: 114/75 117/76 121/79 123/76  Pulse: 68 66 63 62  Resp:  17 14 16   Temp: 98.4 F (36.9 C) 98.2 F (36.8 C) (!) 97.4 F (36.3 C) (!) 97.3 F (36.3 C)  TempSrc: Oral Oral Oral Oral  SpO2: 100% 100% 98% 99%  Weight:      Height:        Wt Readings from Last 3 Encounters:  05/01/19 87.5 kg  04/17/15 94.2 kg  02/11/15 95.3 kg     Intake/Output Summary (Last 24 hours) at 05/03/2019 1226 Last data filed at 05/03/2019 0306 Gross per 24 hour  Intake 3279 ml  Output 490 ml  Net 2789 ml     Physical Exam  Awake Alert,  No new F.N deficits,   [place].AT,PERRAL Supple Neck,No JVD, No cervical lymphadenopathy appriciated.  Symmetrical Chest wall movement, Good air movement bilaterally, CTAB RRR,No Gallops, Rubs or new Murmurs, No Parasternal Heave +ve B.Sounds, Abd Soft, No tenderness, No organomegaly appriciated, No rebound - guarding or rigidity. No Cyanosis, Clubbing or edema, No new Rash or bruise    Data Review:    CBC Recent Labs  Lab 04/30/19 2059 05/02/19 0306 05/03/19 0240  WBC 5.8 6.9 4.9  HGB 13.9 13.4 12.3*  HCT 43.2 41.4 39.3  PLT 230 170 169  MCV 96.9 97.4 98.5  MCH 31.2 31.5 30.8  MCHC 32.2 32.4 31.3  RDW 16.2* 16.5* 16.4*  LYMPHSABS 0.6* 1.4 1.1  MONOABS 0.3 0.3 0.4  EOSABS 0.0 0.0 0.2  BASOSABS 0.0 0.0 0.0    Chemistries  Recent Labs  Lab 04/30/19 2059 05/01/19 0519 05/02/19 0306 05/03/19 0240  NA 135 138 139 140  K 2.7* 3.3* 4.3 4.0  CL 101 105 107 110  CO2 24 20* 23 23  GLUCOSE 135*  110* 117* 95  BUN 30* 30* 30* 27*  CREATININE 2.78* 2.76* 2.51* 2.02*  CALCIUM 8.3* 8.1* 7.7* 8.0*  MG  --   --  2.0 2.0  AST 17 26 46* 26  ALT 14 18 23 27   ALKPHOS 57 55 53 47  BILITOT 1.1 1.0 1.3* 0.6   ------------------------------------------------------------------------------------------------------------------ No results for input(s): CHOL, HDL, LDLCALC, TRIG, CHOLHDL, LDLDIRECT in the last 72 hours.  Lab Results  Component Value Date   HGBA1C 5.1 02/11/2015   ------------------------------------------------------------------------------------------------------------------ No results for input(s): TSH, T4TOTAL, T3FREE, THYROIDAB in the last 72 hours.  Invalid input(s): FREET3 ------------------------------------------------------------------------------------------------------------------ No results for input(s): VITAMINB12, FOLATE, FERRITIN, TIBC, IRON, RETICCTPCT in the last 72 hours.  Coagulation profile Recent Labs  Lab 04/30/19 2059  INR 1.3*    No results for input(s): DDIMER in the last 72 hours.  Cardiac Enzymes No results for input(s): CKMB, TROPONINI, MYOGLOBIN in the last 168 hours.  Invalid input(s): CK ------------------------------------------------------------------------------------------------------------------    Component Value Date/Time   BNP 202.8 (H) 05/03/2019 0240    Micro Results Recent Results (from the past 240 hour(s))  Blood Culture (routine x 2)     Status: None (Preliminary result)   Collection Time: 04/30/19  8:59 PM   Specimen: BLOOD  Result Value Ref Range Status   Specimen Description BLOOD RIGHT WRIST  Final   Special Requests   Final    BOTTLES DRAWN AEROBIC ONLY Blood Culture results may not be optimal due to an inadequate volume of blood received in culture bottles   Culture   Final    NO GROWTH 3 DAYS Performed at [facility], [place] [address], [place], [place] [address]    Report Status PENDING  Incomplete    Urine culture     Status: Abnormal   Collection Time: 04/30/19  8:59 PM   Specimen: In/Out Cath Urine  Result Value Ref Range Status   Specimen Description IN/OUT CATH URINE  Final   Special Requests NONE  Final   Culture (A)  Final    <10,000 COLONIES/mL INSIGNIFICANT GROWTH Performed at [facility], [place] [address], [place], [place] [address]    Report Status 05/02/2019 FINAL  Final  SARS Coronavirus 2 [facility] order, Performed in [facility] hospital lab) Nasopharyngeal Nasopharyngeal Swab     Status: None   Collection Time: 04/30/19  9:01 PM   Specimen: Nasopharyngeal Swab  Result Value Ref Range Status   SARS Coronavirus 2 NEGATIVE NEGATIVE Final    Comment: (NOTE) If result is NEGATIVE SARS-CoV-2 target nucleic acids are NOT DETECTED. The SARS-CoV-2 RNA is generally detectable in upper and lower  respiratory specimens during the acute phase of infection. The lowest  concentration of SARS-CoV-2 viral copies this assay can detect is 250  copies / mL. A negative result does not preclude SARS-CoV-2 infection  and should not be used as the sole basis for treatment or other  patient management decisions.  A negative result may occur with  improper specimen collection / handling, submission of specimen other  than  nasopharyngeal swab, presence of viral mutation(s) within the  areas targeted by this assay, and inadequate number of viral copies  (<250 copies / mL). A negative result must be combined with clinical  observations, patient history, and epidemiological information. If result is POSITIVE SARS-CoV-2 target nucleic acids are DETECTED. The SARS-CoV-2 RNA is generally detectable in upper and lower  respiratory specimens dur ing the acute phase of infection.  Positive  results are indicative of active infection with SARS-CoV-2.  Clinical  correlation with patient history and other diagnostic information is  necessary to determine patient infection status.  Positive  results do  not rule out bacterial infection or co-infection with other viruses. If result is PRESUMPTIVE POSTIVE SARS-CoV-2 nucleic acids MAY BE PRESENT.   A presumptive positive result was obtained on the submitted specimen  and confirmed on repeat testing.  While 2019 novel coronavirus  (SARS-CoV-2) nucleic acids may be present in the submitted sample  additional confirmatory testing may be necessary for epidemiological  and / or clinical management purposes  to differentiate between  SARS-CoV-2 and other Sarbecovirus currently known to infect humans.  If clinically indicated additional testing with an alternate test  methodology [phone]) is advised. The SARS-CoV-2 RNA is generally  detectable in upper and lower respiratory sp ecimens during the acute  phase of infection. The expected result is Negative. Fact Sheet for Patients:  StrictlyIdeas.no Fact Sheet for Healthcare Providers: BankingDealers.co.za This test is not yet approved or cleared by the [place] FDA and has been authorized for detection and/or diagnosis of SARS-CoV-2 by FDA under an Emergency Use Authorization (EUA).  This EUA will remain in effect (meaning this test can be used) for the duration of the COVID-19 declaration under Section 564(b)(1) of the Act, 21 U.S.C. section 360bbb-3(b)(1), unless the authorization is terminated or revoked sooner. Performed at [facility], [place] [address] [place], [place] [address]   Blood Culture (routine x 2)     Status: None (Preliminary result)   Collection Time: 04/30/19  9:04 PM   Specimen: BLOOD  Result Value Ref Range Status   Specimen Description BLOOD LEFT WRIST  Final   Special Requests   Final    BOTTLES DRAWN AEROBIC AND ANAEROBIC Blood Culture results may not be optimal due to an inadequate volume of blood received in culture bottles   Culture   Final    NO GROWTH 3 DAYS Performed at [place], [place] [address], [place], [place] [address]    Report Status PENDING  Incomplete    Radiology Reports Ct Abdomen Pelvis Wo Contrast  Result Date: 04/21/2019 CLINICAL DATA:  Vomiting, weight loss, history of prostate cancer status post TURP EXAM: CT ABDOMEN AND PELVIS WITHOUT CONTRAST TECHNIQUE: Multidetector CT imaging of the abdomen and pelvis was performed following the standard protocol without IV contrast. COMPARISON:  CT abdomen/pelvis dated 06/03/2012 FINDINGS: Lower chest: 3 mm subpleural nodule in the right lower lobe (series 9/image 8), unchanged, benign. Hepatobiliary: Unenhanced liver is unremarkable. Gallbladder is unremarkable. No intrahepatic or extrahepatic ductal dilatation. Pancreas: Within normal limits. Spleen: Within normal limits. Adrenals/Urinary Tract: Adrenal glands are within normal limits. Left kidney is notable for a 16 mm benign hemorrhagic cysts in the lateral left upper pole (series 2/image 43). Additional 13 mm simple cyst in the posterior left lower kidney (series 2/image 47). 2 mm nonobstructing left lower pole renal calculus (series 2/image 51). No hydronephrosis. Right kidney is notable for mild right hydronephrosis with prominent extrarenal pelvis.  No obstructing ureteral calculus is seen. Bladder wall thickening with trabeculation and perivesical stranding, suggesting cystitis, although an underlying bladder neoplasm along the right posterolateral bladder is not excluded (series 2/image 80). Additional irregular bladder wall thickening along the left superior bladder dome (series 2/image 36). Associated 3.5 cm bladder calculus. Stomach/Bowel: Contrast within the mid/distal esophagus, nonspecific but possibly reflecting gastroesophageal reflux. Stomach is within normal limits. No evidence of bowel obstruction. Small to moderate duodenal diverticulum (series 2/image 43). Appendix is not discretely visualized. Left colonic diverticulosis. Adjacent pericolonic stranding along  the sigmoid colon is favored to be related to the bladder inflammation rather than sigmoid diverticulitis. Vascular/Lymphatic: No evidence of abdominal aortic aneurysm. Atherosclerotic calcifications of the abdominal aorta and branch vessels. Small upper abdominal and retroperitoneal lymph nodes, including a dominant 9 mm short axis inferior aortocaval node (series 2/image 55). These findings are similar to 2013 and therefore likely reactive. Reproductive: Prostatomegaly in this patient with known prostate cancer. Enlarged central gland indents the base of the bladder posteriorly. Other: No abdominopelvic ascites. Musculoskeletal: Mild to moderate superior endplate compression fracture deformity at L2, age indeterminate, although new from 2018 myelogram. No retropulsion. Degenerative changes of the visualized thoracolumbar spine. No focal osseous lesions. IMPRESSION: Suspected cystitis.  Indwelling 3.5 cm bladder calculus. Associated irregular bladder wall thickening is poorly evaluated on CT. However, given additional findings, cystoscopy is suggested to exclude primary bladder neoplasm. Mild right hydroureteronephrosis. No associated ureteral calculus on CT. Prostatomegaly in this patient with known prostate cancer. Small upper abdominal/retroperitoneal lymph nodes are similar to 2013, likely reactive. Correlate with PSA. Otherwise, no findings suspicious for metastatic disease. No focal osseous lesions. These results will be called to the ordering clinician or representative by the Radiologist Assistant, and communication documented in the PACS or zVision Dashboard. Electronically Signed   By: Julian Hy M.D.   On: 04/21/2019 16:46   US Renal  Result Date: 05/01/2019 CLINICAL DATA:  85 year old male with acute renal insufficiency. History of bladder stone removal. EXAM: RENAL / URINARY TRACT ULTRASOUND COMPLETE COMPARISON:  CT of the abdomen pelvis dated 04/21/2019. FINDINGS: Right Kidney: Renal  measurements: 8.9 x 4.5 x 4.9 cm = volume: 102 mL. There is a 1 0 x 1.0 x 1.1 cm lower pole cyst. Normal echogenicity. Mild parenchyma atrophy. There is mild fullness of the upper pole collecting system similar to prior CT. No shadowing stone. Left Kidney: Renal measurements: 10.2 x 5.4 x 4.4 cm = volume: 127 mL. Normal echogenicity. There is mild parenchyma atrophy. There is a 9 mm echogenic focus in the interpolar aspect of the left kidney which may represent a nonobstructing stone. There is mild left hydronephrosis. Bladder: The urinary bladder is collapsed. Multiple stones noted within the bladder measuring up to 1.7 cm. IMPRESSION: 1. Mild left hydronephrosis. A 9 mm nonobstructing left renal interpolar calculus noted. 2. No stone in the right kidney. Mild fullness of the upper pole collecting system similar to prior CT. 3. Undistended urinary bladder containing multiple stones. Electronically Signed   By: Anner Crete M.D.   On: 05/01/2019 09:27   Ct Hip Right Wo Contrast  Result Date: 05/03/2019 CLINICAL DATA:  Right hip pain. Painful range of motion. EXAM: CT OF THE RIGHT HIP WITHOUT CONTRAST TECHNIQUE: Multidetector CT imaging of the right hip was performed according to the standard protocol. Multiplanar CT image reconstructions were also generated. COMPARISON:  None. FINDINGS: Bones/Joint/Cartilage There are slight arthritic changes of right femoral head with slight posterolateral joint space narrowing and  cystic degenerative changes of the superolateral aspect of the right acetabulum. No fracture or dislocation or a vascular necrosis. Muscles and Tendons There is chronic atrophy of the right gluteus minimus muscle. Soft tissues Aortic atherosclerosis. Sigmoid diverticulosis. Large bladder calculus. Dilated distal ureters. IMPRESSION: 1. No acute abnormality of the right hip. Slight arthritic changes of the right femoral head. 2. Large bladder calculus. 3. Aortic atherosclerosis. 4. Sigmoid  diverticulosis. 5. Chronic atrophy of the right gluteus minimus muscle. Aortic Atherosclerosis (ICD10-I70.0). Electronically Signed   By: Lorriane Shire M.D.   On: 05/03/2019 09:58   Dg Chest Port 1 View  Result Date: 04/30/2019 CLINICAL DATA:  Patient with history of urinary tract infection. Confusion. EXAM: PORTABLE CHEST 1 VIEW COMPARISON:  Chest radiograph 02/28/2015 FINDINGS: Monitoring leads overlie the patient. Stable cardiac and mediastinal contours. No consolidative pulmonary opacities. No pleural effusion or pneumothorax. IMPRESSION: No acute cardiopulmonary process. Electronically Signed   By: Lovey Newcomer M.D.   On: 04/30/2019 20:58    Time Spent in minutes  30   Lala Lund M.D on 05/03/2019 at 12:26 PM  To page go to [email or web address] - password [facility]

## 2019-05-03 NOTE — Progress Notes (Signed)
   05/02/19 2123  MEWS Score  Resp 14  Pulse Rate 63  BP 121/79  Temp (!) 97.4 F (36.3 C)  SpO2 98 %  O2 Device Nasal Cannula  O2 Flow Rate (L/min) 2 L/min  MEWS Score  MEWS RR 0  MEWS Pulse 0  MEWS Systolic 0  MEWS LOC 0  MEWS Temp 0  MEWS Score 0  MEWS Score Color Green  MEWS Assessment  Is this an acute change? No  MEWS Guidelines - (patients age 83 and over)  Red - At High Risk for Deterioration Yellow - At risk for Deterioration  1. Go to room and assess patient 2. Validate data. Is this patient's baseline? If data confirmed: 3. Is this an acute change? 4. Administer prn meds/treatments as ordered. 5. Note Sepsis score 6. Review goals of care 7. Sports coach, RRT nurse and Provider. 8. Ask Provider to come to bedside.  9. Document patient condition/interventions/response. 10. Increase frequency of vital signs and focused assessments to at least q15 minutes x 4, then q30 minutes x2. - If stable, then q1h x3, then q4h x3 and then q8h or dept. routine. - If unstable, contact Provider & RRT nurse. Prepare for possible transfer. 11. Add entry in progress notes using the smart phrase ".MEWS". 1. Go to room and assess patient 2. Validate data. Is this patient's baseline? If data confirmed: 3. Is this an acute change? 4. Administer prn meds/treatments as ordered? 5. Note Sepsis score 6. Review goals of care 7. Sports coach and Provider 8. Call RRT nurse as needed. 9. Document patient condition/interventions/response. 10. Increase frequency of vital signs and focused assessments to at least q2h x2. - If stable, then q4h x2 and then q8h or dept. routine. - If unstable, contact Provider & RRT nurse. Prepare for possible transfer. 11. Add entry in progress notes using the smart phrase ".MEWS".  Green - Likely stable [place] - Comfort Care Only  1. Continue routine/ordered monitoring.  2. Review goals of care. 1. Continue routine/ordered monitoring. 2.  Review goals of care.

## 2019-05-03 NOTE — TOC Progression Note (Signed)
Transition of Care [facility]) - Progression Note    Patient Details  Name: Barry Swanson MRN: [id number] Date of Birth: 1928-10-25  Transition of Care [facility]) CM/SW [place], LCSW Phone Number: 05/03/2019, 2:09 PM  Clinical Narrative:    Per Kathee Polite able to accept patient for home health services (PT/OT/Aide). Will monitor for orders at time of discharge.    Expected Discharge Plan: [place] Barriers to Discharge: Continued Medical Work up  Expected Discharge Plan and Services Expected Discharge Plan: [place] In-house Referral: Clinical Social Work Discharge Planning Services: NA Post Acute Care Choice: [place] arrangements for the past 2 months: Single Family Home                 DME Arranged: Environmental consultant         HH Arranged: PT, OT, Nurse's Aide [place] Agency: [facility] Date [place] Agency Contacted: 05/03/19 Time [place]: [place] Representative spoke with at [place]: [place] ([place]) Interventions    Readmission Risk Interventions No flowsheet data found.

## 2019-05-04 ENCOUNTER — Other Ambulatory Visit: Payer: Self-pay

## 2019-05-04 DIAGNOSIS — A419 Sepsis, unspecified organism: Secondary | ICD-10-CM | POA: Diagnosis not present

## 2019-05-04 LAB — CBC WITH DIFFERENTIAL/PLATELET
Abs Immature Granulocytes: 0.01 10*3/uL (ref 0.00–0.07)
Basophils Absolute: 0 10*3/uL (ref 0.0–0.1)
Basophils Relative: 0 %
Eosinophils Absolute: 0.2 10*3/uL (ref 0.0–0.5)
Eosinophils Relative: 4 %
HCT: 38.4 % — ABNORMAL LOW (ref 39.0–52.0)
Hemoglobin: 11.9 g/dL — ABNORMAL LOW (ref 13.0–17.0)
Immature Granulocytes: 0 %
Lymphocytes Relative: 21 %
Lymphs Abs: 1.1 10*3/uL (ref 0.7–4.0)
MCH: 30.9 pg (ref 26.0–34.0)
MCHC: 31 g/dL (ref 30.0–36.0)
MCV: 99.7 fL (ref 80.0–100.0)
Monocytes Absolute: 0.4 10*3/uL (ref 0.1–1.0)
Monocytes Relative: 8 %
Neutro Abs: 3.3 10*3/uL (ref 1.7–7.7)
Neutrophils Relative %: 67 %
Platelets: 179 10*3/uL (ref 150–400)
RBC: 3.85 MIL/uL — ABNORMAL LOW (ref 4.22–5.81)
RDW: 16.6 % — ABNORMAL HIGH (ref 11.5–15.5)
WBC: 5 10*3/uL (ref 4.0–10.5)
nRBC: 0 % (ref 0.0–0.2)

## 2019-05-04 LAB — COMPREHENSIVE METABOLIC PANEL
ALT: 24 U/L (ref 0–44)
AST: 20 U/L (ref 15–41)
Albumin: 2.2 g/dL — ABNORMAL LOW (ref 3.5–5.0)
Alkaline Phosphatase: 46 U/L (ref 38–126)
Anion gap: 7 (ref 5–15)
BUN: 22 mg/dL (ref 8–23)
CO2: 23 mmol/L (ref 22–32)
Calcium: 8.2 mg/dL — ABNORMAL LOW (ref 8.9–10.3)
Chloride: 112 mmol/L — ABNORMAL HIGH (ref 98–111)
Creatinine, Ser: 1.69 mg/dL — ABNORMAL HIGH (ref 0.61–1.24)
GFR calc Af Amer: 41 mL/min — ABNORMAL LOW (ref >60)
GFR calc non Af Amer: 35 mL/min — ABNORMAL LOW (ref >60)
Glucose, Bld: 106 mg/dL — ABNORMAL HIGH (ref 70–99)
Potassium: 4.3 mmol/L (ref 3.5–5.1)
Sodium: 142 mmol/L (ref 135–145)
Total Bilirubin: 0.6 mg/dL (ref 0.3–1.2)
Total Protein: 4.5 g/dL — ABNORMAL LOW (ref 6.5–8.1)

## 2019-05-04 LAB — MAGNESIUM: Magnesium: 1.8 mg/dL (ref 1.7–2.4)

## 2019-05-04 LAB — BRAIN NATRIURETIC PEPTIDE: B Natriuretic Peptide: 292.8 pg/mL — ABNORMAL HIGH (ref 0.0–100.0)

## 2019-05-04 MED ORDER — CEPHALEXIN 500 MG PO CAPS: 500.0000 mg | ORAL_CAPSULE | Freq: Three times a day (TID) | ORAL | 0 refills | Status: AC

## 2019-05-04 MED ORDER — AMLODIPINE BESYLATE 10 MG PO TABS: 10.0000 mg | ORAL_TABLET | Freq: Every day | ORAL | 0 refills | Status: DC

## 2019-05-04 NOTE — Progress Notes (Signed)
Patient was discharged home with home health by MD order; discharged instructions review and give to patient and his wife with care notes and prescriptions; IV DIC; patient will be escorted to the car by nurse tech via wheelchair.

## 2019-05-04 NOTE — TOC Transition Note (Signed)
Transition of Care [facility]) - CM/SW Discharge Note   Patient Details  Name: Barry Swanson MRN: [id number] Date of Birth: June 23, 1929  [facility]) CM/SW Contact:  Benard Halsted, LCSW Phone Number: 05/04/2019, 12:03 PM   Clinical Narrative:    CSW notified [facility] that patient is discharging home today.    Final next level of care: [place] Barriers to Discharge: Continued Medical Work up   Patient Goals and CMS Choice Patient states their goals for this hospitalization and ongoing recovery are:: To feel better and go home. CMS Medicare.gov Compare Post Acute Care list provided to:: Patient Choice offered to / list presented to : Patient, Spouse  Discharge Placement                  Name of family member notified: Spouse Patient and family notified of of transfer: 05/04/19  Discharge Plan and Services In-house Referral: Clinical Social Work Discharge Planning Services: NA Post Acute Care Choice: Home Health          DME Arranged: Environmental consultant         HH Arranged: PT, OT, Nurse's Aide [place] Agency: [facility] Date [place]: 05/03/19 Time [place]: [id number] Representative spoke with at [place]: [place] ([place]) Interventions     Readmission Risk Interventions Readmission Risk Prevention Plan 05/03/2019  Transportation Screening Complete  PCP or Specialist Appt within 3-5 Days Complete  HRI or [place] Complete  Social Work Consult for [place] Planning/Counseling Complete  Palliative Care Screening Not Applicable  Medication Review Press photographer) Complete  Some recent data might be hidden

## 2019-05-04 NOTE — Progress Notes (Signed)
Physical Therapy Treatment Patient Details Name: Barry Swanson MRN: [id number] DOB: Sep 07, 1928 Today's Date: 05/04/2019    History of Present Illness 85 y.o. male with medical history significant of hypertension, CVA, hyperlipidemia, hepatitis B, chronic low back pain, pancreatic cancer and osteoarthritis who was recently diagnosed with UTI and bladder stone with UTI.  Patient given antibiotics to take at home, he was brought back with 3-day history of nausea vomiting, confusion, fever chills.  In the ER diagnosed with toxic encephalopathy, AKI, sepsis due to UTI    PT Comments    Excellent mobility progress noted. Pt required min guard assist bed mobility, transfers and ambulation 25 feet with RW. Wife present during session. Pt/wife desire home with HHPT at discharge, which is now appropriate with improved mobility status. Wife reports no concerns providing needed level of care. Recommendations updated. Pt in recliner with feet elevated at end of session.    Follow Up Recommendations  Home health PT;Supervision/Assistance - 24 hour     Equipment Recommendations  None recommended by PT    Recommendations for Other Services       Precautions / Restrictions Precautions Precautions: Fall    Mobility  Bed Mobility Overal bed mobility: Needs Assistance Bed Mobility: Supine to Sit     Supine to sit: Min guard;HOB elevated     General bed mobility comments: +rail, increased time  Transfers Overall transfer level: Needs assistance Equipment used: Rolling walker (2 wheeled) Transfers: Sit to/from Stand Sit to Stand: Min guard         General transfer comment: cues for hand placement, increased time, min guard for safety  Ambulation/Gait Ambulation/Gait assistance: Min guard Gait Distance (Feet): 25 Feet Assistive device: Rolling walker (2 wheeled) Gait Pattern/deviations: Step-through pattern;Decreased stride length;Trunk flexed Gait velocity: decreased Gait velocity  interpretation: <1.31 ft/sec, indicative of household ambulator General Gait Details: cues to stay close to RW, no LOB   Stairs             Wheelchair Mobility    Modified Rankin (Stroke Patients Only)       Balance Overall balance assessment: Needs assistance Sitting-balance support: Feet supported;No upper extremity supported Sitting balance-Leahy Scale: Good     Standing balance support: Bilateral upper extremity supported;During functional activity   Standing balance comment: relies on BUE support                            Cognition Arousal/Alertness: Awake/alert Behavior During Therapy: WFL for tasks assessed/performed Overall Cognitive Status: Within Functional Limits for tasks assessed                                 General Comments: HOH, slow to respond at times      Exercises      General Comments General comments (skin integrity, edema, etc.): wife present during session      Pertinent Vitals/Pain Pain Assessment: No/denies pain    Home Living                      Prior Function            PT Goals (current goals can now be found in the care plan section) Acute Rehab PT Goals Patient Stated Goal: home PT Goal Formulation: With patient/family Time For Goal Achievement: 05/16/19 Potential to Achieve Goals: Good Progress towards PT goals: Progressing toward goals  Frequency    Min 3X/week      PT Plan Discharge plan needs to be updated    Co-evaluation              AM-PAC PT "6 Clicks" Mobility   Outcome Measure  Help needed turning from your back to your side while in a flat bed without using bedrails?: None Help needed moving from lying on your back to sitting on the side of a flat bed without using bedrails?: A Little Help needed moving to and from a bed to a chair (including a wheelchair)?: A Little Help needed standing up from a chair using your arms (e.g., wheelchair or bedside  chair)?: A Little Help needed to walk in hospital room?: A Little Help needed climbing 3-5 steps with a railing? : A Lot 6 Click Score: 18    End of Session Equipment Utilized During Treatment: Gait belt Activity Tolerance: Patient tolerated treatment well Patient left: in chair;with call bell/phone within reach;with family/visitor present Nurse Communication: Mobility status PT Visit Diagnosis: Difficulty in walking, not elsewhere classified (R26.2)     Time: 3358-2518 PT Time Calculation (min) (ACUTE ONLY): 28 min  Charges:  $Gait Training: 23-37 mins                     Lorrin Goodell, PT  Office # [phone] Pager [phone]    Barry Swanson 05/04/2019, 12:50 PM

## 2019-05-04 NOTE — Discharge Instructions (Signed)
Follow with Primary MD [name], MD in 7 days   Get CBC, CMP, 2 view Chest X ray -  checked next visit within 1 week by Primary MD   Activity: As tolerated with Full fall precautions use walker/cane & assistance as needed  Disposition Home   Diet: Heart Healthy   Special Instructions: If you have smoked or chewed Tobacco  in the last 2 yrs please stop smoking, stop any regular Alcohol  and or any Recreational drug use.  On your next visit with your primary care physician please Get Medicines reviewed and adjusted.  Please request your Prim.MD to go over all Hospital Tests and Procedure/Radiological results at the follow up, please get all Hospital records sent to your Prim MD by signing hospital release before you go home.  If you experience worsening of your admission symptoms, develop shortness of breath, life threatening emergency, suicidal or homicidal thoughts you must seek medical attention immediately by calling 911 or calling your MD immediately  if symptoms less severe.  You Must read complete instructions/literature along with all the possible adverse reactions/side effects for all the Medicines you take and that have been prescribed to you. Take any new Medicines after you have completely understood and accpet all the possible adverse reactions/side effects.

## 2019-05-04 NOTE — Discharge Summary (Signed)
Barry Swanson ZOX:[id number] DOB: 1929/03/29 DOA: 04/30/2019  PCP: Barry Infante, MD  Admit date: 04/30/2019  Discharge date: 05/04/2019  Admitted From: Home  Disposition:  Home   Recommendations for Outpatient Follow-up:   Follow up with PCP in 1-2 weeks  PCP Please obtain BMP/CBC, 2 view CXR in 1week,  (see Discharge instructions)   PCP Please follow up on the following pending results:    Home Health: RN,PT,Aide   Equipment/Devices: None  Consultations: Neuro Dr Jeffie Pollock over phone ( followed remotely) Discharge Condition: Stable    CODE STATUS: Full    Diet Recommendation: Heart Healthy     Chief Complaint  Patient presents with   generalized weakness     Brief history of present illness from the day of admission and additional interim summary    Barry Swanson a 85 y.o.malewith medical history significant ofhypertension, CVA, hyperlipidemia, hepatitis B, chronic low back pain,pancreatic cancer and osteoarthritis who was recently diagnosed with UTI and bladder stone with UTI. Patient given antibiotics to take at home, he was brought back with 3-day history of nausea vomiting, confusion, fever chills.  In the ER diagnosed with toxic encephalopathy, AKI, sepsis due to UTI and admitted.                                                                 Hospital Course   1.  Sepsis due to UTI.    In a patient with history of multiple bladder stones and nonobstructing kidney stones, urine cultures were inconclusive, blood cultures were negative, he was empirically treated with Rocephin, sepsis pathophysiology has completely resolved, he is afebrile without any leukocytosis, will be placed on Keflex for 5 more days and discharged home.  Note Dr. Jeffie Pollock urology was consulted and he followed the patient remotely.   Post discharge he has been requested to follow with PCP and urology within a week..   2.  Toxic encephalopathy.  Due to #1 above.    Improved with supportive care, now close to baseline.  3.  AKI.  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  Last creatinine is 4 years ago which is close to 1.5.  He was on a diuretic and ARB both have been held, he was hydrated with IV fluids, creatinine close to baseline, upon discharge she will be placed on Norvasc with PCP follow-up for renal function monitoring.  4.  Hypokalemia.    Replaced and stable.  5.  Essential hypertension.  Blood pressure stable placed on Norvasc upon discharge.  6.  Dyslipidemia.  Placed on statin.  7.  BPH and history of prostate cancer.  Resume Flomax.    Outpatient urology follow-up.    Discharge diagnosis     Principal Problem:   Sepsis ([place]) Active Problems:   Acute encephalopathy   Hyperlipidemia  Essential hypertension   Calculus (=stone)   Malignant neoplasm of prostate (HCC)   Bladder outflow obstruction   Acute pyelonephritis   Acute kidney injury [facility])    Discharge instructions    Discharge Instructions    Diet - low sodium heart healthy   Complete by: As directed    Discharge instructions   Complete by: As directed    Follow with Primary MD Barry Infante, MD in 7 days   Get CBC, CMP, 2 view Chest X ray -  checked next visit within 1 week by Primary MD   Activity: As tolerated with Full fall precautions use walker/cane & assistance as needed  Disposition Home   Diet: Heart Healthy   Special Instructions: If you have smoked or chewed Tobacco  in the last 2 yrs please stop smoking, stop any regular Alcohol  and or any Recreational drug use.  On your next visit with your primary care physician please Get Medicines reviewed and adjusted.  Please request your Prim.MD to go over all Hospital Tests and Procedure/Radiological results at the follow up, please get all Hospital records sent to your  Prim MD by signing hospital release before you go home.  If you experience worsening of your admission symptoms, develop shortness of breath, life threatening emergency, suicidal or homicidal thoughts you must seek medical attention immediately by calling 911 or calling your MD immediately  if symptoms less severe.  You Must read complete instructions/literature along with all the possible adverse reactions/side effects for all the Medicines you take and that have been prescribed to you. Take any new Medicines after you have completely understood and accpet all the possible adverse reactions/side effects.   Increase activity slowly   Complete by: As directed       Discharge Medications   Allergies as of 05/04/2019   No Known Allergies     Medication List    STOP taking these medications   chlorthalidone 25 MG tablet Commonly known as: HYGROTON   valsartan 160 MG tablet Commonly known as: DIOVAN     TAKE these medications   alendronate 70 MG tablet Commonly known as: FOSAMAX Take 70 mg by mouth once a week. Sunday   amLODipine 10 MG tablet Commonly known as: NORVASC Take 1 tablet (10 mg total) by mouth daily.   aspirin 81 MG chewable tablet Chew 4 tablets (324 mg total) by mouth daily.   Centrum Silver tablet Take 1 tablet by mouth daily.   cephALEXin 500 MG capsule Commonly known as: KEFLEX Take 1 capsule (500 mg total) by mouth 3 (three) times daily for 5 days.   COQ10 PO Take 1 tablet by mouth daily.   felodipine 10 MG 24 hr tablet Commonly known as: PLENDIL Take 10 mg by mouth daily.   finasteride 5 MG tablet Commonly known as: PROSCAR Take 5 mg by mouth daily.   gabapentin 300 MG capsule Commonly known as: NEURONTIN Take 300 mg by mouth as needed (pain).   GARLIC PO Take 1 tablet by mouth daily.   simvastatin 10 MG tablet Commonly known as: ZOCOR Take 10 mg by mouth daily.   tamsulosin 0.4 MG Caps capsule Commonly known as: FLOMAX Take 0.4 mg by  mouth 2 (two) times daily.       Follow-up Information    [facility]   Specialty: Home Health Services Why: Home Health services arranged and will start 48 hours post-discharge.  Contact information: [address]  [place] [address] [phone]        Barry Infante, MD. Schedule an appointment as soon as possible for a visit in 1 week(s).   Specialty: Internal Medicine Contact information: [place] [address] [phone]        Irine Seal, MD. Schedule an appointment as soon as possible for a visit in 1 week(s).   Specialty: Urology Contact information: [place] [address] [phone]           Major procedures and Radiology Reports - PLEASE review detailed and final reports thoroughly  -       Ct Abdomen Pelvis Wo Contrast  Result Date: 04/21/2019 CLINICAL DATA:  Vomiting, weight loss, history of prostate cancer status post TURP EXAM: CT ABDOMEN AND PELVIS WITHOUT CONTRAST TECHNIQUE: Multidetector CT imaging of the abdomen and pelvis was performed following the standard protocol without IV contrast. COMPARISON:  CT abdomen/pelvis dated 06/03/2012 FINDINGS: Lower chest: 3 mm subpleural nodule in the right lower lobe (series 9/image 8), unchanged, benign. Hepatobiliary: Unenhanced liver is unremarkable. Gallbladder is unremarkable. No intrahepatic or extrahepatic ductal dilatation. Pancreas: Within normal limits. Spleen: Within normal limits. Adrenals/Urinary Tract: Adrenal glands are within normal limits. Left kidney is notable for a 16 mm benign hemorrhagic cysts in the lateral left upper pole (series 2/image 43). Additional 13 mm simple cyst in the posterior left lower kidney (series 2/image 47). 2 mm nonobstructing left lower pole renal calculus (series 2/image 51). No hydronephrosis. Right kidney is notable for mild right hydronephrosis with prominent extrarenal pelvis. No obstructing ureteral calculus  is seen. Bladder wall thickening with trabeculation and perivesical stranding, suggesting cystitis, although an underlying bladder neoplasm along the right posterolateral bladder is not excluded (series 2/image 80). Additional irregular bladder wall thickening along the left superior bladder dome (series 2/image 36). Associated 3.5 cm bladder calculus. Stomach/Bowel: Contrast within the mid/distal esophagus, nonspecific but possibly reflecting gastroesophageal reflux. Stomach is within normal limits. No evidence of bowel obstruction. Small to moderate duodenal diverticulum (series 2/image 43). Appendix is not discretely visualized. Left colonic diverticulosis. Adjacent pericolonic stranding along the sigmoid colon is favored to be related to the bladder inflammation rather than sigmoid diverticulitis. Vascular/Lymphatic: No evidence of abdominal aortic aneurysm. Atherosclerotic calcifications of the abdominal aorta and branch vessels. Small upper abdominal and retroperitoneal lymph nodes, including a dominant 9 mm short axis inferior aortocaval node (series 2/image 55). These findings are similar to 2013 and therefore likely reactive. Reproductive: Prostatomegaly in this patient with known prostate cancer. Enlarged central gland indents the base of the bladder posteriorly. Other: No abdominopelvic ascites. Musculoskeletal: Mild to moderate superior endplate compression fracture deformity at L2, age indeterminate, although new from 2018 myelogram. No retropulsion. Degenerative changes of the visualized thoracolumbar spine. No focal osseous lesions. IMPRESSION: Suspected cystitis.  Indwelling 3.5 cm bladder calculus. Associated irregular bladder wall thickening is poorly evaluated on CT. However, given additional findings, cystoscopy is suggested to exclude primary bladder neoplasm. Mild right hydroureteronephrosis. No associated ureteral calculus on CT. Prostatomegaly in this patient with known prostate cancer. Small  upper abdominal/retroperitoneal lymph nodes are similar to 2013, likely reactive. Correlate with PSA. Otherwise, no findings suspicious for metastatic disease. No focal osseous lesions. These results will be called to the ordering clinician or representative by the Radiologist Assistant, and communication documented in the PACS or zVision Dashboard. Electronically Signed   By: Julian Hy M.D.   On: 04/21/2019 16:46   US Renal  Result Date: 05/01/2019 CLINICAL DATA:  85 year old male with acute renal insufficiency. History of bladder stone removal. EXAM: RENAL / URINARY TRACT ULTRASOUND COMPLETE COMPARISON:  CT of the abdomen pelvis dated 04/21/2019. FINDINGS: Right Kidney: Renal measurements: 8.9 x 4.5 x 4.9 cm = volume: 102 mL. There is a 1 0 x 1.0 x 1.1 cm lower pole cyst. Normal echogenicity. Mild parenchyma atrophy. There is mild fullness of the upper pole collecting system similar to prior CT. No shadowing stone. Left Kidney: Renal measurements: 10.2 x 5.4 x 4.4 cm = volume: 127 mL. Normal echogenicity. There is mild parenchyma atrophy. There is a 9 mm echogenic focus in the interpolar aspect of the left kidney which may represent a nonobstructing stone. There is mild left hydronephrosis. Bladder: The urinary bladder is collapsed. Multiple stones noted within the bladder measuring up to 1.7 cm. IMPRESSION: 1. Mild left hydronephrosis. A 9 mm nonobstructing left renal interpolar calculus noted. 2. No stone in the right kidney. Mild fullness of the upper pole collecting system similar to prior CT. 3. Undistended urinary bladder containing multiple stones. Electronically Signed   By: Anner Crete M.D.   On: 05/01/2019 09:27   Ct Hip Right Wo Contrast  Result Date: 05/03/2019 CLINICAL DATA:  Right hip pain. Painful range of motion. EXAM: CT OF THE RIGHT HIP WITHOUT CONTRAST TECHNIQUE: Multidetector CT imaging of the right hip was performed according to the standard protocol. Multiplanar CT image  reconstructions were also generated. COMPARISON:  None. FINDINGS: Bones/Joint/Cartilage There are slight arthritic changes of right femoral head with slight posterolateral joint space narrowing and cystic degenerative changes of the superolateral aspect of the right acetabulum. No fracture or dislocation or a vascular necrosis. Muscles and Tendons There is chronic atrophy of the right gluteus minimus muscle. Soft tissues Aortic atherosclerosis. Sigmoid diverticulosis. Large bladder calculus. Dilated distal ureters. IMPRESSION: 1. No acute abnormality of the right hip. Slight arthritic changes of the right femoral head. 2. Large bladder calculus. 3. Aortic atherosclerosis. 4. Sigmoid diverticulosis. 5. Chronic atrophy of the right gluteus minimus muscle. Aortic Atherosclerosis (ICD10-I70.0). Electronically Signed   By: Lorriane Shire M.D.   On: 05/03/2019 09:58   Dg Chest Port 1 View  Result Date: 04/30/2019 CLINICAL DATA:  Patient with history of urinary tract infection. Confusion. EXAM: PORTABLE CHEST 1 VIEW COMPARISON:  Chest radiograph 02/28/2015 FINDINGS: Monitoring leads overlie the patient. Stable cardiac and mediastinal contours. No consolidative pulmonary opacities. No pleural effusion or pneumothorax. IMPRESSION: No acute cardiopulmonary process. Electronically Signed   By: Lovey Newcomer M.D.   On: 04/30/2019 20:58    Micro Results     Recent Results (from the past 240 hour(s))  Blood Culture (routine x 2)     Status: None (Preliminary result)   Collection Time: 04/30/19  8:59 PM   Specimen: BLOOD  Result Value Ref Range Status   Specimen Description BLOOD RIGHT WRIST  Final   Special Requests   Final    BOTTLES DRAWN AEROBIC ONLY Blood Culture results may not be optimal due to an inadequate volume of blood received in culture bottles   Culture   Final    NO GROWTH 3 DAYS Performed at [facility], [place] [address] [place], [place] [address]    Report Status PENDING  Incomplete    Urine culture     Status: Abnormal   Collection Time: 04/30/19  8:59 PM   Specimen: In/Out Cath Urine  Result Value Ref Range Status   Specimen Description IN/OUT CATH URINE  Final  Special Requests NONE  Final   Culture (A)  Final    <10,000 COLONIES/mL INSIGNIFICANT GROWTH Performed at [facility], [place] [address] [place], [place] [address]    Report Status 05/02/2019 FINAL  Final  SARS Coronavirus 2 [facility] order, Performed in [facility] hospital lab) Nasopharyngeal Nasopharyngeal Swab     Status: None   Collection Time: 04/30/19  9:01 PM   Specimen: Nasopharyngeal Swab  Result Value Ref Range Status   SARS Coronavirus 2 NEGATIVE NEGATIVE Final    Comment: (NOTE) If result is NEGATIVE SARS-CoV-2 target nucleic acids are NOT DETECTED. The SARS-CoV-2 RNA is generally detectable in upper and lower  respiratory specimens during the acute phase of infection. The lowest  concentration of SARS-CoV-2 viral copies this assay can detect is 250  copies / mL. A negative result does not preclude SARS-CoV-2 infection  and should not be used as the sole basis for treatment or other  patient management decisions.  A negative result may occur with  improper specimen collection / handling, submission of specimen other  than nasopharyngeal swab, presence of viral mutation(s) within the  areas targeted by this assay, and inadequate number of viral copies  (<250 copies / mL). A negative result must be combined with clinical  observations, patient history, and epidemiological information. If result is POSITIVE SARS-CoV-2 target nucleic acids are DETECTED. The SARS-CoV-2 RNA is generally detectable in upper and lower  respiratory specimens dur ing the acute phase of infection.  Positive  results are indicative of active infection with SARS-CoV-2.  Clinical  correlation with patient history and other diagnostic information is  necessary to determine patient infection status.  Positive  results do  not rule out bacterial infection or co-infection with other viruses. If result is PRESUMPTIVE POSTIVE SARS-CoV-2 nucleic acids MAY BE PRESENT.   A presumptive positive result was obtained on the submitted specimen  and confirmed on repeat testing.  While 2019 novel coronavirus  (SARS-CoV-2) nucleic acids may be present in the submitted sample  additional confirmatory testing may be necessary for epidemiological  and / or clinical management purposes  to differentiate between  SARS-CoV-2 and other Sarbecovirus currently known to infect humans.  If clinically indicated additional testing with an alternate test  methodology [phone]) is advised. The SARS-CoV-2 RNA is generally  detectable in upper and lower respiratory sp ecimens during the acute  phase of infection. The expected result is Negative. Fact Sheet for Patients:  StrictlyIdeas.no Fact Sheet for Healthcare Providers: BankingDealers.co.za This test is not yet approved or cleared by the [place] FDA and has been authorized for detection and/or diagnosis of SARS-CoV-2 by FDA under an Emergency Use Authorization (EUA).  This EUA will remain in effect (meaning this test can be used) for the duration of the COVID-19 declaration under Section 564(b)(1) of the Act, 21 U.S.C. section 360bbb-3(b)(1), unless the authorization is terminated or revoked sooner. Performed at [facility], [place] [address] [place], [place] [address]   Blood Culture (routine x 2)     Status: None (Preliminary result)   Collection Time: 04/30/19  9:04 PM   Specimen: BLOOD  Result Value Ref Range Status   Specimen Description BLOOD LEFT WRIST  Final   Special Requests   Final    BOTTLES DRAWN AEROBIC AND ANAEROBIC Blood Culture results may not be optimal due to an inadequate volume of blood received in culture bottles   Culture   Final    NO GROWTH 3 DAYS  Performed at [place], [place] [address], [place], [place] [address]    Report Status PENDING  Incomplete    Today   Subjective    Yang Rack today has no headache,no chest abdominal pain,no new weakness tingling or numbness, feels much better wants to go home today.    Objective   Blood pressure 137/70, pulse 60, temperature 98.1 F (36.7 C), temperature source Oral, resp. rate 14, height 5\' 7"  (1.702 m), weight 87.5 kg, SpO2 96 %.   Intake/Output Summary (Last 24 hours) at 05/04/2019 1115 Last data filed at 05/04/2019 1005 Gross per 24 hour  Intake 1520 ml  Output 1125 ml  Net 395 ml    Exam Awake Alert,   No new F.N deficits,   Joy.AT,PERRAL Supple Neck,No JVD, No cervical lymphadenopathy appriciated.  Symmetrical Chest wall movement, Good air movement bilaterally, CTAB RRR,No Gallops,Rubs or new Murmurs, No Parasternal Heave +ve B.Sounds, Abd Soft, Non tender, No organomegaly appriciated, No rebound -guarding or rigidity. No Cyanosis, Clubbing or edema, No new Rash or bruise   Data Review   CBC w Diff:  Lab Results  Component Value Date   WBC 5.0 05/04/2019   HGB 11.9 (L) 05/04/2019   HCT 38.4 (L) 05/04/2019   PLT 179 05/04/2019   LYMPHOPCT 21 05/04/2019   MONOPCT 8 05/04/2019   EOSPCT 4 05/04/2019   BASOPCT 0 05/04/2019    CMP:  Lab Results  Component Value Date   NA 142 05/04/2019   K 4.3 05/04/2019   CL 112 (H) 05/04/2019   CO2 23 05/04/2019   BUN 22 05/04/2019   CREATININE 1.69 (H) 05/04/2019   PROT 4.5 (L) 05/04/2019   ALBUMIN 2.2 (L) 05/04/2019   BILITOT 0.6 05/04/2019   ALKPHOS 46 05/04/2019   AST 20 05/04/2019   ALT 24 05/04/2019  .   Total Time in preparing paper work, data evaluation and todays exam - 69 minutes  Lala Lund M.D on 05/04/2019 at 11:15 AM  [facility]   Office  [phone]

## 2019-05-05 LAB — CULTURE, BLOOD (ROUTINE X 2)
Culture: NO GROWTH
Culture: NO GROWTH

## 2019-05-11 DIAGNOSIS — E876 Hypokalemia: Secondary | ICD-10-CM | POA: Diagnosis not present

## 2019-05-11 DIAGNOSIS — Z87440 Personal history of urinary (tract) infections: Secondary | ICD-10-CM | POA: Diagnosis not present

## 2019-05-11 DIAGNOSIS — I10 Essential (primary) hypertension: Secondary | ICD-10-CM | POA: Diagnosis not present

## 2019-05-11 DIAGNOSIS — Z8507 Personal history of malignant neoplasm of pancreas: Secondary | ICD-10-CM | POA: Diagnosis not present

## 2019-05-11 DIAGNOSIS — N132 Hydronephrosis with renal and ureteral calculous obstruction: Secondary | ICD-10-CM | POA: Diagnosis not present

## 2019-05-11 DIAGNOSIS — M1990 Unspecified osteoarthritis, unspecified site: Secondary | ICD-10-CM | POA: Diagnosis not present

## 2019-05-11 DIAGNOSIS — M545 Low back pain: Secondary | ICD-10-CM | POA: Diagnosis not present

## 2019-05-11 DIAGNOSIS — E785 Hyperlipidemia, unspecified: Secondary | ICD-10-CM | POA: Diagnosis not present

## 2019-05-11 DIAGNOSIS — Z8673 Personal history of transient ischemic attack (TIA), and cerebral infarction without residual deficits: Secondary | ICD-10-CM | POA: Diagnosis not present

## 2019-05-11 DIAGNOSIS — Z8546 Personal history of malignant neoplasm of prostate: Secondary | ICD-10-CM | POA: Diagnosis not present

## 2019-05-11 DIAGNOSIS — G8929 Other chronic pain: Secondary | ICD-10-CM | POA: Diagnosis not present

## 2019-05-11 DIAGNOSIS — N401 Enlarged prostate with lower urinary tract symptoms: Secondary | ICD-10-CM | POA: Diagnosis not present

## 2019-05-11 DIAGNOSIS — B1910 Unspecified viral hepatitis B without hepatic coma: Secondary | ICD-10-CM | POA: Diagnosis not present

## 2019-05-11 DIAGNOSIS — N39498 Other specified urinary incontinence: Secondary | ICD-10-CM | POA: Diagnosis not present

## 2019-05-11 DIAGNOSIS — Z9181 History of falling: Secondary | ICD-10-CM | POA: Diagnosis not present

## 2019-05-12 DIAGNOSIS — N401 Enlarged prostate with lower urinary tract symptoms: Secondary | ICD-10-CM | POA: Diagnosis not present

## 2019-05-12 DIAGNOSIS — I10 Essential (primary) hypertension: Secondary | ICD-10-CM | POA: Diagnosis not present

## 2019-05-12 DIAGNOSIS — M545 Low back pain: Secondary | ICD-10-CM | POA: Diagnosis not present

## 2019-05-12 DIAGNOSIS — N39498 Other specified urinary incontinence: Secondary | ICD-10-CM | POA: Diagnosis not present

## 2019-05-12 DIAGNOSIS — N132 Hydronephrosis with renal and ureteral calculous obstruction: Secondary | ICD-10-CM | POA: Diagnosis not present

## 2019-05-12 DIAGNOSIS — E876 Hypokalemia: Secondary | ICD-10-CM | POA: Diagnosis not present

## 2019-05-15 DIAGNOSIS — N210 Calculus in bladder: Secondary | ICD-10-CM | POA: Diagnosis not present

## 2019-05-15 DIAGNOSIS — R310 Gross hematuria: Secondary | ICD-10-CM | POA: Diagnosis not present

## 2019-05-16 DIAGNOSIS — N132 Hydronephrosis with renal and ureteral calculous obstruction: Secondary | ICD-10-CM | POA: Diagnosis not present

## 2019-05-16 DIAGNOSIS — E876 Hypokalemia: Secondary | ICD-10-CM | POA: Diagnosis not present

## 2019-05-16 DIAGNOSIS — M545 Low back pain: Secondary | ICD-10-CM | POA: Diagnosis not present

## 2019-05-16 DIAGNOSIS — R1110 Vomiting, unspecified: Secondary | ICD-10-CM | POA: Diagnosis not present

## 2019-05-16 DIAGNOSIS — N401 Enlarged prostate with lower urinary tract symptoms: Secondary | ICD-10-CM | POA: Diagnosis not present

## 2019-05-16 DIAGNOSIS — N390 Urinary tract infection, site not specified: Secondary | ICD-10-CM | POA: Diagnosis not present

## 2019-05-16 DIAGNOSIS — R319 Hematuria, unspecified: Secondary | ICD-10-CM | POA: Diagnosis not present

## 2019-05-16 DIAGNOSIS — R634 Abnormal weight loss: Secondary | ICD-10-CM | POA: Diagnosis not present

## 2019-05-16 DIAGNOSIS — R531 Weakness: Secondary | ICD-10-CM | POA: Diagnosis not present

## 2019-05-16 DIAGNOSIS — Z79899 Other long term (current) drug therapy: Secondary | ICD-10-CM | POA: Diagnosis not present

## 2019-05-16 DIAGNOSIS — E538 Deficiency of other specified B group vitamins: Secondary | ICD-10-CM | POA: Diagnosis not present

## 2019-05-16 DIAGNOSIS — N39498 Other specified urinary incontinence: Secondary | ICD-10-CM | POA: Diagnosis not present

## 2019-05-16 DIAGNOSIS — R1310 Dysphagia, unspecified: Secondary | ICD-10-CM | POA: Diagnosis not present

## 2019-05-16 DIAGNOSIS — I10 Essential (primary) hypertension: Secondary | ICD-10-CM | POA: Diagnosis not present

## 2019-05-16 DIAGNOSIS — N210 Calculus in bladder: Secondary | ICD-10-CM | POA: Diagnosis not present

## 2019-05-16 DIAGNOSIS — I639 Cerebral infarction, unspecified: Secondary | ICD-10-CM | POA: Diagnosis not present

## 2019-05-16 DIAGNOSIS — M810 Age-related osteoporosis without current pathological fracture: Secondary | ICD-10-CM | POA: Diagnosis not present

## 2019-05-19 DIAGNOSIS — N132 Hydronephrosis with renal and ureteral calculous obstruction: Secondary | ICD-10-CM | POA: Diagnosis not present

## 2019-05-19 DIAGNOSIS — I10 Essential (primary) hypertension: Secondary | ICD-10-CM | POA: Diagnosis not present

## 2019-05-19 DIAGNOSIS — R1310 Dysphagia, unspecified: Secondary | ICD-10-CM | POA: Diagnosis not present

## 2019-05-19 DIAGNOSIS — E876 Hypokalemia: Secondary | ICD-10-CM | POA: Diagnosis not present

## 2019-05-19 DIAGNOSIS — M545 Low back pain: Secondary | ICD-10-CM | POA: Diagnosis not present

## 2019-05-19 DIAGNOSIS — N401 Enlarged prostate with lower urinary tract symptoms: Secondary | ICD-10-CM | POA: Diagnosis not present

## 2019-05-19 DIAGNOSIS — N39498 Other specified urinary incontinence: Secondary | ICD-10-CM | POA: Diagnosis not present

## 2019-05-22 ENCOUNTER — Other Ambulatory Visit: Payer: Self-pay | Admitting: Gastroenterology

## 2019-05-22 DIAGNOSIS — R1310 Dysphagia, unspecified: Secondary | ICD-10-CM

## 2019-05-23 ENCOUNTER — Other Ambulatory Visit: Payer: Self-pay | Admitting: Urology

## 2019-05-23 DIAGNOSIS — I10 Essential (primary) hypertension: Secondary | ICD-10-CM | POA: Diagnosis not present

## 2019-05-23 DIAGNOSIS — E876 Hypokalemia: Secondary | ICD-10-CM | POA: Diagnosis not present

## 2019-05-23 DIAGNOSIS — M545 Low back pain: Secondary | ICD-10-CM | POA: Diagnosis not present

## 2019-05-23 DIAGNOSIS — N401 Enlarged prostate with lower urinary tract symptoms: Secondary | ICD-10-CM | POA: Diagnosis not present

## 2019-05-23 DIAGNOSIS — N132 Hydronephrosis with renal and ureteral calculous obstruction: Secondary | ICD-10-CM | POA: Diagnosis not present

## 2019-05-23 DIAGNOSIS — N39498 Other specified urinary incontinence: Secondary | ICD-10-CM | POA: Diagnosis not present

## 2019-05-24 DIAGNOSIS — N39498 Other specified urinary incontinence: Secondary | ICD-10-CM | POA: Diagnosis not present

## 2019-05-24 DIAGNOSIS — E876 Hypokalemia: Secondary | ICD-10-CM | POA: Diagnosis not present

## 2019-05-24 DIAGNOSIS — N132 Hydronephrosis with renal and ureteral calculous obstruction: Secondary | ICD-10-CM | POA: Diagnosis not present

## 2019-05-24 DIAGNOSIS — I10 Essential (primary) hypertension: Secondary | ICD-10-CM | POA: Diagnosis not present

## 2019-05-24 DIAGNOSIS — N401 Enlarged prostate with lower urinary tract symptoms: Secondary | ICD-10-CM | POA: Diagnosis not present

## 2019-05-24 DIAGNOSIS — M545 Low back pain: Secondary | ICD-10-CM | POA: Diagnosis not present

## 2019-05-25 DIAGNOSIS — N132 Hydronephrosis with renal and ureteral calculous obstruction: Secondary | ICD-10-CM | POA: Diagnosis not present

## 2019-05-25 DIAGNOSIS — M545 Low back pain: Secondary | ICD-10-CM | POA: Diagnosis not present

## 2019-05-25 DIAGNOSIS — N401 Enlarged prostate with lower urinary tract symptoms: Secondary | ICD-10-CM | POA: Diagnosis not present

## 2019-05-25 DIAGNOSIS — N39498 Other specified urinary incontinence: Secondary | ICD-10-CM | POA: Diagnosis not present

## 2019-05-25 DIAGNOSIS — E876 Hypokalemia: Secondary | ICD-10-CM | POA: Diagnosis not present

## 2019-05-25 DIAGNOSIS — I10 Essential (primary) hypertension: Secondary | ICD-10-CM | POA: Diagnosis not present

## 2019-05-26 DIAGNOSIS — N132 Hydronephrosis with renal and ureteral calculous obstruction: Secondary | ICD-10-CM | POA: Diagnosis not present

## 2019-05-26 DIAGNOSIS — I10 Essential (primary) hypertension: Secondary | ICD-10-CM | POA: Diagnosis not present

## 2019-05-26 DIAGNOSIS — N401 Enlarged prostate with lower urinary tract symptoms: Secondary | ICD-10-CM | POA: Diagnosis not present

## 2019-05-26 DIAGNOSIS — M545 Low back pain: Secondary | ICD-10-CM | POA: Diagnosis not present

## 2019-05-26 DIAGNOSIS — N39498 Other specified urinary incontinence: Secondary | ICD-10-CM | POA: Diagnosis not present

## 2019-05-26 DIAGNOSIS — E876 Hypokalemia: Secondary | ICD-10-CM | POA: Diagnosis not present

## 2019-05-29 ENCOUNTER — Other Ambulatory Visit (HOSPITAL_COMMUNITY)
Admission: RE | Admit: 2019-05-29 | Discharge: 2019-05-29 | Disposition: A | Discharge status: Home / Self Care | Payer: Medicare Other | Source: Ambulatory Visit | Attending: Urology | Admitting: Urology

## 2019-05-29 DIAGNOSIS — Z01812 Encounter for preprocedural laboratory examination: Secondary | ICD-10-CM | POA: Diagnosis not present

## 2019-05-29 DIAGNOSIS — Z20828 Contact with and (suspected) exposure to other viral communicable diseases: Secondary | ICD-10-CM | POA: Diagnosis not present

## 2019-05-29 LAB — SARS CORONAVIRUS 2 (TAT 6-24 HRS): SARS Coronavirus 2: NEGATIVE

## 2019-05-30 NOTE — Patient Instructions (Addendum)
DUE TO COVID-19 ONLY ONE VISITOR IS ALLOWED TO COME WITH YOU AND STAY IN THE WAITING ROOM ONLY DURING PRE OP AND PROCEDURE DAY OF SURGERY. THE 1 VISITOR MAY VISIT WITH YOU AFTER SURGERY IN YOUR PRIVATE ROOM DURING VISITING HOURS ONLY!  ONCE YOUR COVID TEST IS COMPLETED, PLEASE BEGIN THE QUARANTINE INSTRUCTIONS AS OUTLINED IN YOUR HANDOUT.                [name]    Your procedure is scheduled on: Thursday 06/01/2019   Report to [facility] Main  Entrance    Report to admitting at AM 7:00am     Call this number if you have problems the morning of surgery [phone]    Remember: Do not eat food or drink liquids after Midnight.     BRUSH YOUR TEETH MORNING OF SURGERY AND RINSE YOUR MOUTH OUT, NO CHEWING GUM CANDY OR MINTS.     Take these medicines the morning of surgery with A SIP OF WATER: Gabapentin (Neurontin) if needed, Tamsulosin (Flomax)                                You may not have any metal on your body including hair pins and              piercings  Do not wear jewelry, make-up, lotions, powders or perfumes, deodorant              Men may shave face and neck.   Do not bring valuables to the hospital. [place].  Contacts, dentures or bridgework may not be worn into surgery.  Leave suitcase in the car. After surgery it may be brought to your room.     Patients discharged the day of surgery will not be allowed to drive home. IF YOU ARE HAVING SURGERY AND GOING HOME THE SAME DAY, YOU  MUST HAVE AN ADULT TO DRIVE YOU HOME AND BE WITH YOU FOR 24 HOURS. YOU MAY GO HOME BY TAXI OR UBER OR ORTHERWISE,  BUT AN ADULT MUST ACCOMPANY YOU HOME AND STAY WITH YOU FOR 24 HOURS.  Name and phone number of your driver: Girlfriend Candelaria Stagers [phone]                Please read over the following fact sheets you were given: _____________________________________________________________________             [facility] - Preparing for Surgery Before surgery, you can play an important role.  Because skin is not sterile, your skin needs to be as free of germs as possible.  You can reduce the number of germs on your skin by washing with CHG (chlorahexidine gluconate) soap before surgery.  CHG is an antiseptic cleaner which kills germs and bonds with the skin to continue killing germs even after washing. Please DO NOT use if you have an allergy to CHG or antibacterial soaps.  If your skin becomes reddened/irritated stop using the CHG and inform your nurse when you arrive at Short Stay. Do not shave (including legs and underarms) for at least 48 hours prior to the first CHG shower.  You may shave your face/neck. Please follow these instructions carefully:  1.  Shower with CHG Soap the night before surgery and the  morning of Surgery.  2.  If  you choose to wash your hair, wash your hair first as usual with your  normal  shampoo.  3.  After you shampoo, rinse your hair and body thoroughly to remove the  shampoo.                            4.  Use CHG as you would any other liquid soap.  You can apply chg directly  to the skin and wash                       Gently with a scrungie or clean washcloth.  5.  Apply the CHG Soap to your body ONLY FROM THE NECK DOWN.   Do not use on face/ open                           Wound or open sores. Avoid contact with eyes, ears mouth and genitals (private parts).                       Wash face,  Genitals (private parts) with your normal soap.             6.  Wash thoroughly, paying special attention to the area where your surgery  will be performed.  7.  Thoroughly rinse your body with warm water from the neck down.  8.  DO NOT shower/wash with your normal soap after using and rinsing off  the CHG Soap.                9.  Pat yourself dry with a clean towel.            10.  Wear clean pajamas.            11.  Place clean sheets on your bed the night of your first shower and do  not  sleep with pets. Day of Surgery : Do not apply any lotions/deodorants the morning of surgery.  Please wear clean clothes to the hospital/surgery center.  FAILURE TO FOLLOW THESE INSTRUCTIONS MAY RESULT IN THE CANCELLATION OF YOUR SURGERY PATIENT SIGNATURE_________________________________  NURSE SIGNATURE__________________________________  ________________________________________________________________________

## 2019-05-31 ENCOUNTER — Other Ambulatory Visit: Payer: Self-pay

## 2019-05-31 ENCOUNTER — Encounter (HOSPITAL_COMMUNITY): Payer: Self-pay | Admitting: Anesthesiology

## 2019-05-31 ENCOUNTER — Encounter (HOSPITAL_COMMUNITY): Payer: Self-pay

## 2019-05-31 ENCOUNTER — Encounter (HOSPITAL_COMMUNITY)
Admission: RE | Admit: 2019-05-31 | Discharge: 2019-05-31 | Disposition: A | Discharge status: Home / Self Care | Payer: Medicare Other | Source: Ambulatory Visit | Attending: Urology | Admitting: Urology

## 2019-05-31 DIAGNOSIS — Z01810 Encounter for preprocedural cardiovascular examination: Secondary | ICD-10-CM | POA: Diagnosis not present

## 2019-05-31 LAB — CBC
HCT: 47.2 % (ref 39.0–52.0)
Hemoglobin: 14.4 g/dL (ref 13.0–17.0)
MCH: 30.3 pg (ref 26.0–34.0)
MCHC: 30.5 g/dL (ref 30.0–36.0)
MCV: 99.4 fL (ref 80.0–100.0)
Platelets: 266 10*3/uL (ref 150–400)
RBC: 4.75 MIL/uL (ref 4.22–5.81)
RDW: 15.6 % — ABNORMAL HIGH (ref 11.5–15.5)
WBC: 6.7 10*3/uL (ref 4.0–10.5)
nRBC: 0 % (ref 0.0–0.2)

## 2019-05-31 LAB — COMPREHENSIVE METABOLIC PANEL
ALT: 11 U/L (ref 0–44)
AST: 13 U/L — ABNORMAL LOW (ref 15–41)
Albumin: 3.6 g/dL (ref 3.5–5.0)
Alkaline Phosphatase: 69 U/L (ref 38–126)
Anion gap: 8 (ref 5–15)
BUN: 17 mg/dL (ref 8–23)
CO2: 24 mmol/L (ref 22–32)
Calcium: 8.9 mg/dL (ref 8.9–10.3)
Chloride: 109 mmol/L (ref 98–111)
Creatinine, Ser: 1.67 mg/dL — ABNORMAL HIGH (ref 0.61–1.24)
GFR calc Af Amer: 42 mL/min — ABNORMAL LOW (ref >60)
GFR calc non Af Amer: 36 mL/min — ABNORMAL LOW (ref >60)
Glucose, Bld: 109 mg/dL — ABNORMAL HIGH (ref 70–99)
Potassium: 4.6 mmol/L (ref 3.5–5.1)
Sodium: 141 mmol/L (ref 135–145)
Total Bilirubin: 0.9 mg/dL (ref 0.3–1.2)
Total Protein: 6.3 g/dL — ABNORMAL LOW (ref 6.5–8.1)

## 2019-05-31 NOTE — Progress Notes (Addendum)
PCP - Dr. Joylene Draft MD Cardiologist - none   Chest x-ray -  1 view 04/30/2019 EKG - 04/30/2019 Stress Test - none ECHO - 02/08/2015 Cardiac Cath - none  Sleep Study - none CPAP - none  Fasting Blood Sugar - NA Checks Blood Sugar _____ times a day  Blood Thinner Instructions:  Aspirin Instructions: Aspirin 324mg   Last Dose: 05/22/2019   Per Dr. Joylene Draft, pt is stop aspirin 1 week prior to surgery, pt stopped on 05/22/2019  Anesthesia review: Chart given to Konrad Felix PA Per EKG on 05/03/19 Shows Left Anterior Fascicular Block. No Change since last tracing. Please see labs.   Patient denies shortness of breath, fever, cough and chest pain at PAT appointment   Patient verbalized understanding of instructions that were given to them at the PAT appointment. Patient was also instructed that they will need to review over the PAT instructions again at home before surgery.

## 2019-05-31 NOTE — Anesthesia Preprocedure Evaluation (Addendum)
Anesthesia Evaluation  Patient identified by MRN, date of birth, ID band Patient awake    Reviewed: Allergy & Precautions, NPO status , Patient's Chart, lab work & pertinent test results  Airway Mallampati: II  TM Distance: >3 FB Neck ROM: Full    Dental  (+) Dental Advisory Given, Edentulous Lower, Edentulous Upper   Pulmonary former smoker,    Pulmonary exam normal breath sounds clear to auscultation       Cardiovascular hypertension, Pt. on medications Normal cardiovascular exam Rhythm:Regular Rate:Normal     Neuro/Psych TIACVA    GI/Hepatic negative GI ROS, (+) Hepatitis -, B  Endo/Other  negative endocrine ROS  Renal/GU negative Renal ROS   BLADDER STONE    Musculoskeletal negative musculoskeletal ROS (+)   Abdominal   Peds  Hematology negative hematology ROS (+)   Anesthesia Other Findings Day of surgery medications reviewed with the patient.  Reproductive/Obstetrics                            Anesthesia Physical Anesthesia Plan  ASA: III  Anesthesia Plan: General   Post-op Pain Management:    Induction: Intravenous  PONV Risk Score and Plan: 3 and Dexamethasone and Ondansetron  Airway Management Planned: LMA  Additional Equipment:   Intra-op Plan:   Post-operative Plan: Extubation in OR  Informed Consent: I have reviewed the patients History and Physical, chart, labs and discussed the procedure including the risks, benefits and alternatives for the proposed anesthesia with the patient or authorized representative who has indicated his/her understanding and acceptance.     Dental advisory given  Plan Discussed with: CRNA  Anesthesia Plan Comments:        Anesthesia Quick Evaluation

## 2019-06-01 ENCOUNTER — Observation Stay (HOSPITAL_COMMUNITY)
Admission: RE | Admit: 2019-06-01 | Discharge: 2019-06-02 | Disposition: A | Discharge status: Home / Self Care | Payer: Medicare Other | Source: Ambulatory Visit | Attending: Urology | Admitting: Urology

## 2019-06-01 ENCOUNTER — Encounter (HOSPITAL_COMMUNITY): Payer: Self-pay

## 2019-06-01 ENCOUNTER — Encounter (HOSPITAL_COMMUNITY)
Admission: RE | Disposition: A | Discharge status: Home / Self Care | Payer: Self-pay | Source: Ambulatory Visit | Attending: Urology

## 2019-06-01 ENCOUNTER — Ambulatory Visit (HOSPITAL_COMMUNITY): Payer: Medicare Other | Admitting: Anesthesiology

## 2019-06-01 DIAGNOSIS — Z8673 Personal history of transient ischemic attack (TIA), and cerebral infarction without residual deficits: Secondary | ICD-10-CM | POA: Insufficient documentation

## 2019-06-01 DIAGNOSIS — Z8546 Personal history of malignant neoplasm of prostate: Secondary | ICD-10-CM | POA: Insufficient documentation

## 2019-06-01 DIAGNOSIS — R350 Frequency of micturition: Secondary | ICD-10-CM | POA: Diagnosis not present

## 2019-06-01 DIAGNOSIS — R262 Difficulty in walking, not elsewhere classified: Secondary | ICD-10-CM | POA: Diagnosis not present

## 2019-06-01 DIAGNOSIS — N211 Calculus in urethra: Secondary | ICD-10-CM | POA: Diagnosis not present

## 2019-06-01 DIAGNOSIS — B1910 Unspecified viral hepatitis B without hepatic coma: Secondary | ICD-10-CM | POA: Diagnosis not present

## 2019-06-01 DIAGNOSIS — Z7982 Long term (current) use of aspirin: Secondary | ICD-10-CM | POA: Diagnosis not present

## 2019-06-01 DIAGNOSIS — K222 Esophageal obstruction: Secondary | ICD-10-CM | POA: Diagnosis not present

## 2019-06-01 DIAGNOSIS — E7800 Pure hypercholesterolemia, unspecified: Secondary | ICD-10-CM | POA: Insufficient documentation

## 2019-06-01 DIAGNOSIS — G459 Transient cerebral ischemic attack, unspecified: Secondary | ICD-10-CM | POA: Diagnosis not present

## 2019-06-01 DIAGNOSIS — I10 Essential (primary) hypertension: Secondary | ICD-10-CM | POA: Insufficient documentation

## 2019-06-01 DIAGNOSIS — N401 Enlarged prostate with lower urinary tract symptoms: Secondary | ICD-10-CM | POA: Diagnosis not present

## 2019-06-01 DIAGNOSIS — R3915 Urgency of urination: Secondary | ICD-10-CM | POA: Diagnosis not present

## 2019-06-01 DIAGNOSIS — N210 Calculus in bladder: Principal | ICD-10-CM | POA: Diagnosis present

## 2019-06-01 DIAGNOSIS — N3289 Other specified disorders of bladder: Secondary | ICD-10-CM | POA: Insufficient documentation

## 2019-06-01 DIAGNOSIS — E785 Hyperlipidemia, unspecified: Secondary | ICD-10-CM | POA: Diagnosis not present

## 2019-06-01 DIAGNOSIS — Z79899 Other long term (current) drug therapy: Secondary | ICD-10-CM | POA: Insufficient documentation

## 2019-06-01 DIAGNOSIS — R310 Gross hematuria: Secondary | ICD-10-CM | POA: Diagnosis not present

## 2019-06-01 DIAGNOSIS — R54 Age-related physical debility: Secondary | ICD-10-CM | POA: Diagnosis not present

## 2019-06-01 DIAGNOSIS — R1110 Vomiting, unspecified: Secondary | ICD-10-CM

## 2019-06-01 DIAGNOSIS — Z87891 Personal history of nicotine dependence: Secondary | ICD-10-CM | POA: Insufficient documentation

## 2019-06-01 HISTORY — PX: CYSTOSCOPY WITH LITHOLAPAXY: SHX1425

## 2019-06-01 SURGERY — CYSTOSCOPY, WITH BLADDER CALCULUS LITHOLAPAXY
Anesthesia: General

## 2019-06-01 MED ORDER — BELLADONNA ALKALOIDS-OPIUM 16.2-60 MG RE SUPP
RECTAL | Status: DC | PRN
  Administered 2019-06-01: 1 via RECTAL

## 2019-06-01 MED ORDER — LIDOCAINE HCL (CARDIAC) PF 100 MG/5ML IV SOSY
PREFILLED_SYRINGE | INTRAVENOUS | Status: DC | PRN
  Administered 2019-06-01: 60 mg via INTRAVENOUS
  Administered 2019-06-01: 40 mg via INTRAVENOUS
  Administered 2019-06-01: 100 mg via INTRAVENOUS

## 2019-06-01 MED ORDER — ONDANSETRON HCL 4 MG/2ML IJ SOLN: 4.0000 mg | Freq: Once | INTRAMUSCULAR | Status: DC | PRN

## 2019-06-01 MED ORDER — LIDOCAINE 2% (20 MG/ML) 5 ML SYRINGE
INTRAMUSCULAR | Status: AC
  Filled 2019-06-01: qty 5

## 2019-06-01 MED ORDER — LIDOCAINE HCL URETHRAL/MUCOSAL 2 % EX GEL
CUTANEOUS | Status: AC
  Filled 2019-06-01: qty 5

## 2019-06-01 MED ORDER — SODIUM CHLORIDE 0.9 % IR SOLN
Status: DC | PRN
  Administered 2019-06-01: 51000 mL

## 2019-06-01 MED ORDER — FENTANYL CITRATE (PF) 100 MCG/2ML IJ SOLN
INTRAMUSCULAR | Status: DC | PRN
  Administered 2019-06-01 (×2): 50 ug via INTRAVENOUS
  Administered 2019-06-01 (×4): 25 ug via INTRAVENOUS

## 2019-06-01 MED ORDER — VITAMIN B-12 1000 MCG PO TABS
5000.0000 ug | ORAL_TABLET | Freq: Every day | ORAL | Status: DC
  Administered 2019-06-02: 5000 ug via ORAL
  Filled 2019-06-01: qty 5

## 2019-06-01 MED ORDER — FENTANYL CITRATE (PF) 100 MCG/2ML IJ SOLN: 25.0000 ug | INTRAMUSCULAR | Status: DC | PRN

## 2019-06-01 MED ORDER — STERILE WATER FOR IRRIGATION IR SOLN
Status: DC | PRN
  Administered 2019-06-01: 9000 mL

## 2019-06-01 MED ORDER — GABAPENTIN 300 MG PO CAPS: 300.0000 mg | ORAL_CAPSULE | ORAL | Status: DC | PRN

## 2019-06-01 MED ORDER — DEXAMETHASONE SODIUM PHOSPHATE 4 MG/ML IJ SOLN
INTRAMUSCULAR | Status: DC | PRN
  Administered 2019-06-01: 4 mg via INTRAVENOUS

## 2019-06-01 MED ORDER — SODIUM CHLORIDE 0.9 % IR SOLN
3000.0000 mL | Status: DC
  Administered 2019-06-01 (×3): 3000 mL

## 2019-06-01 MED ORDER — FINASTERIDE 5 MG PO TABS
5.0000 mg | ORAL_TABLET | Freq: Every day | ORAL | Status: DC
  Administered 2019-06-01: 20:00:00 5 mg via ORAL
  Filled 2019-06-01: qty 1

## 2019-06-01 MED ORDER — PROPOFOL 10 MG/ML IV BOLUS
INTRAVENOUS | Status: AC
  Filled 2019-06-01: qty 40

## 2019-06-01 MED ORDER — ADULT MULTIVITAMIN W/MINERALS CH
1.0000 | ORAL_TABLET | Freq: Every day | ORAL | Status: DC
  Administered 2019-06-02: 13:00:00 1 via ORAL
  Filled 2019-06-01: qty 1

## 2019-06-01 MED ORDER — STERILE WATER FOR IRRIGATION IR SOLN
Status: DC | PRN
  Administered 2019-06-01: 1000 mL

## 2019-06-01 MED ORDER — CIPROFLOXACIN HCL 500 MG PO TABS
500.0000 mg | ORAL_TABLET | Freq: Once | ORAL | Status: AC
  Administered 2019-06-01: 20:00:00 500 mg via ORAL
  Filled 2019-06-01: qty 1

## 2019-06-01 MED ORDER — FENTANYL CITRATE (PF) 100 MCG/2ML IJ SOLN
INTRAMUSCULAR | Status: AC
  Filled 2019-06-01: qty 2

## 2019-06-01 MED ORDER — TAMSULOSIN HCL 0.4 MG PO CAPS
0.4000 mg | ORAL_CAPSULE | Freq: Two times a day (BID) | ORAL | Status: DC
  Administered 2019-06-01 – 2019-06-02 (×2): 0.4 mg via ORAL
  Filled 2019-06-01 (×2): qty 1

## 2019-06-01 MED ORDER — SIMVASTATIN 20 MG PO TABS
10.0000 mg | ORAL_TABLET | Freq: Every day | ORAL | Status: DC
  Administered 2019-06-01: 10 mg via ORAL
  Filled 2019-06-01: qty 1

## 2019-06-01 MED ORDER — ACETAMINOPHEN 500 MG PO TABS
1000.0000 mg | ORAL_TABLET | Freq: Once | ORAL | Status: AC
  Administered 2019-06-01: 1000 mg via ORAL
  Filled 2019-06-01: qty 2

## 2019-06-01 MED ORDER — ONDANSETRON HCL 4 MG/2ML IJ SOLN
INTRAMUSCULAR | Status: AC
  Filled 2019-06-01: qty 4

## 2019-06-01 MED ORDER — ONDANSETRON HCL 4 MG/2ML IJ SOLN
INTRAMUSCULAR | Status: DC | PRN
  Administered 2019-06-01: 4 mg via INTRAVENOUS

## 2019-06-01 MED ORDER — CIPROFLOXACIN IN D5W 400 MG/200ML IV SOLN
INTRAVENOUS | Status: DC | PRN
  Administered 2019-06-01: 400 mg via INTRAVENOUS

## 2019-06-01 MED ORDER — PROPOFOL 10 MG/ML IV BOLUS
INTRAVENOUS | Status: DC | PRN
  Administered 2019-06-01: 80 mg via INTRAVENOUS

## 2019-06-01 MED ORDER — CEFAZOLIN SODIUM-DEXTROSE 2-4 GM/100ML-% IV SOLN
2.0000 g | INTRAVENOUS | Status: AC
  Administered 2019-06-01: 2 g via INTRAVENOUS
  Filled 2019-06-01: qty 100

## 2019-06-01 MED ORDER — VITAMIN D 25 MCG (1000 UNIT) PO TABS
5000.0000 [IU] | ORAL_TABLET | Freq: Every day | ORAL | Status: DC
  Administered 2019-06-02: 5000 [IU] via ORAL
  Filled 2019-06-01: qty 5

## 2019-06-01 MED ORDER — EPHEDRINE SULFATE 50 MG/ML IJ SOLN
INTRAMUSCULAR | Status: DC | PRN
  Administered 2019-06-01 (×6): 5 mg via INTRAVENOUS

## 2019-06-01 MED ORDER — ACETAMINOPHEN 10 MG/ML IV SOLN
1000.0000 mg | Freq: Four times a day (QID) | INTRAVENOUS | Status: AC
  Administered 2019-06-01 – 2019-06-02 (×3): 1000 mg via INTRAVENOUS
  Filled 2019-06-01 (×3): qty 100

## 2019-06-01 MED ORDER — DEXMEDETOMIDINE HCL IN NACL 200 MCG/50ML IV SOLN
INTRAVENOUS | Status: DC | PRN
  Administered 2019-06-01 (×3): 4 ug via INTRAVENOUS

## 2019-06-01 MED ORDER — AMLODIPINE BESYLATE 10 MG PO TABS
10.0000 mg | ORAL_TABLET | Freq: Every day | ORAL | Status: DC
  Filled 2019-06-01: qty 1

## 2019-06-01 MED ORDER — EPHEDRINE 5 MG/ML INJ
INTRAVENOUS | Status: AC
  Filled 2019-06-01: qty 10

## 2019-06-01 MED ORDER — ACETAMINOPHEN 10 MG/ML IV SOLN
INTRAVENOUS | Status: AC
  Filled 2019-06-01: qty 100

## 2019-06-01 MED ORDER — CIPROFLOXACIN IN D5W 400 MG/200ML IV SOLN
INTRAVENOUS | Status: AC
  Filled 2019-06-01: qty 200

## 2019-06-01 MED ORDER — BELLADONNA ALKALOIDS-OPIUM 16.2-30 MG RE SUPP
RECTAL | Status: AC
  Filled 2019-06-01: qty 1

## 2019-06-01 MED ORDER — LIDOCAINE 2% (20 MG/ML) 5 ML SYRINGE
INTRAMUSCULAR | Status: AC
  Filled 2019-06-01: qty 10

## 2019-06-01 MED ORDER — DEXAMETHASONE SODIUM PHOSPHATE 10 MG/ML IJ SOLN
INTRAMUSCULAR | Status: AC
  Filled 2019-06-01: qty 2

## 2019-06-01 MED ORDER — GARLIC 2000 MG PO TBEC: 2000.0000 mg | DELAYED_RELEASE_TABLET | Freq: Every day | ORAL | Status: DC

## 2019-06-01 MED ORDER — LACTATED RINGERS IV SOLN
INTRAVENOUS | Status: DC
  Administered 2019-06-01: 14:00:00 1000 mL via INTRAVENOUS
  Administered 2019-06-01: 08:00:00 via INTRAVENOUS

## 2019-06-01 SURGICAL SUPPLY — 21 items
BAG URINE DRAINAGE (UROLOGICAL SUPPLIES) ×2 IMPLANT
BAG URO CATCHER STRL LF (MISCELLANEOUS) ×3 IMPLANT
CATH FOLEY 3WAY 30CC 24FR (CATHETERS) ×3
CATH URET 5FR 28IN OPEN ENDED (CATHETERS) ×2 IMPLANT
CATH URTH STD 24FR FL 3W 2 (CATHETERS) IMPLANT
CLOTH BEACON ORANGE TIMEOUT ST (SAFETY) ×3 IMPLANT
FIBER LASER FLEXIVA 1000 (UROLOGICAL SUPPLIES) ×2 IMPLANT
GLOVE BIOGEL M STRL SZ7.5 (GLOVE) ×3 IMPLANT
GLOVE BIOGEL PI IND STRL 7.0 (GLOVE) IMPLANT
GLOVE BIOGEL PI INDICATOR 7.0 (GLOVE) ×6
GOWN STRL REUS W/TWL LRG LVL3 (GOWN DISPOSABLE) ×2 IMPLANT
GOWN STRL REUS W/TWL XL LVL3 (GOWN DISPOSABLE) ×5 IMPLANT
KIT PROBE TRILOGY 3.9X350 (MISCELLANEOUS) ×2 IMPLANT
KIT TURNOVER KIT A (KITS) ×2 IMPLANT
MANIFOLD NEPTUNE II (INSTRUMENTS) ×3 IMPLANT
PACK CYSTO (CUSTOM PROCEDURE TRAY) ×3 IMPLANT
SYR 3ML LL SCALE MARK (SYRINGE) ×2 IMPLANT
SYRINGE IRR TOOMEY STRL 70CC (SYRINGE) ×2 IMPLANT
TUBING CONNECTING 10 (TUBING) ×1 IMPLANT
TUBING CONNECTING 10' (TUBING) ×1
TUBING UROLOGY SET (TUBING) ×3 IMPLANT

## 2019-06-01 NOTE — Anesthesia Procedure Notes (Signed)
Procedure Name: LMA Insertion Date/Time: 06/01/2019 9:22 AM Performed by: Lavina Hamman, CRNA Pre-anesthesia Checklist: Patient identified, Emergency Drugs available, Suction available and Patient being monitored Patient Re-evaluated:Patient Re-evaluated prior to induction Oxygen Delivery Method: Circle System Utilized Preoxygenation: Pre-oxygenation with 100% oxygen Induction Type: IV induction Ventilation: Mask ventilation without difficulty LMA: LMA inserted LMA Size: 4.0 Number of attempts: 1 Airway Equipment and Method: Bite block Placement Confirmation: positive ETCO2 Tube secured with: Tape Dental Injury: Teeth and Oropharynx as per pre-operative assessment

## 2019-06-01 NOTE — Progress Notes (Signed)
Patient being admitted for prolonged anesthesia time and concern thus for his overall safety at home overnight as well as hematuria.    S: No complaints of pain.  No teeth today (left them at home) so he's not sure he can eat.  Vitals:   06/01/19 1445 06/01/19 1545 06/01/19 1645 06/01/19 1728  BP: 122/69 132/76 125/77 119/76  Pulse: 65 71 68 70  Resp: 18 (!) 24 18 16   Temp: 97.6 F (36.4 C) 97.7 F (36.5 C)  97.9 F (36.6 C)  TempSrc:    Oral  SpO2: 95% 92% 97% 100%  Height:    5\' 7"  (1.702 m)    Intake/Output Summary (Last 24 hours) at 06/01/2019 1738 Last data filed at 06/01/2019 1635 Gross per 24 hour  Intake 8000 ml  Output 7650 ml  Net 350 ml   Recent Labs    05/31/19 0853  WBC 6.7  HGB 14.4  HCT 47.2   Recent Labs    05/31/19 0853  NA 141  K 4.6  CL 109  CO2 24  GLUCOSE 109*  BUN 17  CREATININE 1.67*  CALCIUM 8.9   No results for input(s): LABPT, INR in the last 72 hours. No results for input(s): PSA in the last 72 hours. No results for input(s): LABURIN in the last 72 hours. Results for orders placed or performed during the hospital encounter of 05/29/19  SARS CORONAVIRUS 2 (TAT 6-24 HRS) Nasopharyngeal Nasopharyngeal Swab     Status: None   Collection Time: 05/29/19  3:20 PM   Specimen: Nasopharyngeal Swab  Result Value Ref Range Status   SARS Coronavirus 2 NEGATIVE NEGATIVE Final    Comment: (NOTE) SARS-CoV-2 target nucleic acids are NOT DETECTED. The SARS-CoV-2 RNA is generally detectable in upper and lower respiratory specimens during the acute phase of infection. Negative results do not preclude SARS-CoV-2 infection, do not rule out co-infections with other pathogens, and should not be used as the sole basis for treatment or other patient management decisions. Negative results must be combined with clinical observations, patient history, and epidemiological information. The expected result is Negative. Fact Sheet for  Patients: SugarRoll.be Fact Sheet for Healthcare Providers: https://www.woods-mathews.com/ This test is not yet approved or cleared by the [place] FDA and  has been authorized for detection and/or diagnosis of SARS-CoV-2 by FDA under an Emergency Use Authorization (EUA). This EUA will remain  in effect (meaning this test can be used) for the duration of the COVID-19 declaration under Section 56 4(b)(1) of the Act, 21 U.S.C. section 360bbb-3(b)(1), unless the authorization is terminated or revoked sooner. Performed at [facility], [place] [address], [place], [place] [address]     Imp: s/p cystolithalopaxy. On CBI, hematuria improving. Plan: observation overnight, likely will leave foley in at discharge.  May benefit from home health.   In addition he has a h/o vomitting when he eats.  I'll try and get the barium swallow that was requested by GI while he is inhouse.  Plan for discharge tomorrow w/ catheter.

## 2019-06-01 NOTE — Op Note (Signed)
Preoperative diagnosis:  1. Severe lower urinary tract symptoms 2. Large bladder stone - 4.5cm   Postoperative diagnosis:  1. same   Procedure: 1. cystolithalopaxy  Surgeon: Ardis Hughs, MD  Anesthesia: General  Complications: None  Intraoperative findings:  #1 - prostate regrowth bilaterally, stones present in the posterior urethra, behind areas of regrowth. #2 - bladder stone was VERY hard on the outside layer, unable to breakup stone with the lithotrite, and VERY sponge like and soft on the inside consistent with well formed large blood clot. #3 - bleeding limited visualization at the end of the case, but the overwhelming majority of the stone/clot was removed. #4 - trabeculated bladder with large cellules which also contained some stone.   EBL: 100cc  Specimens: bladder stone  Indication: Barry Swanson is a 85 y.o. patient with h/o BPH and had a HoLEP procedure at [facility] in 2012.  He was lost to follow-up and returned to see me with gross hematuria and severe uninary frequency.  After reviewing the management options for treatment, he elected to proceed with the above surgical procedure(s). We have discussed the potential benefits and risks of the procedure, side effects of the proposed treatment, the likelihood of the patient achieving the goals of the procedure, and any potential problems that might occur during the procedure or recuperation. Informed consent has been obtained.  Description of procedure:  The patient was taken to the operating room and general anesthesia was induced.  The patient was placed in the dorsal lithotomy position, prepped and draped in the usual sterile fashion, and preoperative antibiotics were administered. A preoperative time-out was performed.   Cystoscopy then commenced and the above findings were noted.  He had large cellules at the bladder trigone and laterally.  He had a large stone in the bladder that was very hard on the outside and  soft and spongy on the inside.  There were stones within the prostatic urethra.    I exchanged the cystoscope for the nephroscope after dilating the urethral orifice.  Using the highest settings of the lithoclast I was able to chip the stone off the sponge-like center of the stone, but the stone pieces were incredibly hard and were not easily fragmented/removed with the nephroscope.  I subsequently elected to laser the stones using the 1000um fiber with various settings using around 80kJ of energy.  I then evacuated the fragments using the irrigation syringe.    I then again tried to facilitate removal of the sponge-like center part of the stone using the lithoclast and it was two soft and pliable to make any progress.  As such I lasered the center portion initially into a few smaller pieces, which took a while and proved to be inefficient.  I then used the stone crusher and was able to rip some of the tissue off the core of the stone and removed that.  The visualization was difficult and the stone crusher was thus inefficient and I then just used a two pronged grasper through the nephroscope to remove the clot material which was more successful.  The material then moved into his urethra and was difficult to move back into this bladder.  I was able to get the majority of the inner core of the stone but was unable to see because of bleeding to confirm whether the entire clot was removed.   Having spent ~3 hours struggling to remove his stone, we opted to end the anesthesia and wake him up.  I did  feel as if the overwhelming majority of stone was successfully removed.  I passed a 19F 3-way catheter into the patient as initiated CBI prior to transferring him back to the PACU.  A B&O suppository was placed in his rectum.  He was awakened and return to the PACU in stable condition.  Ardis Hughs, M.D.

## 2019-06-01 NOTE — Anesthesia Postprocedure Evaluation (Signed)
Anesthesia Post Note  Patient: Barry Swanson  Procedure(s) Performed: CYSTOSCOPY WITH LITHOLAPAXY (N/A )     Patient location during evaluation: PACU Anesthesia Type: General Level of consciousness: awake and alert Pain management: pain level controlled Vital Signs Assessment: post-procedure vital signs reviewed and stable Respiratory status: spontaneous breathing, nonlabored ventilation and respiratory function stable Cardiovascular status: blood pressure returned to baseline and stable Postop Assessment: no apparent nausea or vomiting Anesthetic complications: no    Last Vitals:  Vitals:   06/01/19 1545 06/01/19 1645  BP: 132/76 125/77  Pulse: 71 68  Resp: (!) 24 18  Temp: 36.5 C   SpO2: 92% 97%    Last Pain:  Vitals:   06/01/19 1645  TempSrc:   PainSc: Asleep                 Catalina Gravel

## 2019-06-01 NOTE — H&P (Signed)
85 year old male who I saw approximately 2 weeks ago for urinary frequency and urgency. The patient was noted to have a large stone within his bladder. He had a previous HoLEP in 2014 and partially resected prostate with some regrowth. He also had some small stones within the prostatic urethra. We discussed treating his bladder stone as this is almost certainly only way to improve his voiding symptoms. However, at that time the patient was also having significant emesis with oral intake and he and his partner opted to consult GI prior to treating his stones. However, following our encounter the patient was subsequently admitted with fevers and treated for a urinary tract infection. His urine culture in our office was negative, and there was no growths in the hospital either. The patient was treated with Keflex the symptoms did improve.   The patient is seen today for follow-up and further management.   The patient has been doing better since being discharged from the hospital. He is now having less episodes of emesis, but does have them occasionally. His partner has been helping by giving him a mechanical soft diet. She has also been helping him with nutritional shakes. He has physical therapy at home.     ALLERGIES: No Allergies    MEDICATIONS: Finasteride 5 mg tablet TAKE 1 TABLET DAILY  Finasteride 5 mg tablet TAKE 1 TABLET DAILY  Amlodipine Besylate 10 mg tablet  Co Q 10 CAPS Oral  Diovan 40 mg tablet Oral  Finasteride 5 mg tablet 1 Oral  Fosamax 70 mg tablet Oral  Gabapentin 740 mg capsule  Garlic CAPS Oral  Multivitamins tablet Oral  Niacin TABS Oral  Potassium TABS Oral  Simvastatin 80 mg tablet Oral  Tamsulosin Hcl 0.4 mg capsule 1 Oral Bedtime  Vitamin D-3 TABS Oral     GU PSH: Cystoscopy - 04/28/2019       PSH Notes: Incision And Drainage Of Pilonidal Cyst   NON-GU PSH: Drain Pilonidal Cyst - 2007     GU PMH: Gross hematuria, Gross hematuria - 2016 Prostate Cancer,  Prostate cancer - 2016 Other microscopic hematuria, Microscopic hematuria - 2016 BPH w/o LUTS, Benign prostatic hypertrophy without lower urinary tract symptoms - 2015 Nocturia, Nocturia - 2015 Urinary Frequency, Increased urinary frequency - 2015 Bladder Stone, Bladder calculus - 2014 Bladder-neck stenosis/contracture, 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, Erectile dysfunction due to arterial insufficiency - 2014 Elevated PSA, Elevated prostate specific antigen (PSA) - 2014 Hematuria, Unspec, Hematuria - 2014 History of prostate cancer, Prostate Cancer - 2014 Primary hypogonadism, Hypogonadism, testicular - 2014 Urinary Tract Inf, Unspec site, Pyuria - 2014      PMH Notes:  2008-08-07 11:23:51 - Note: Visit For: Exam Following High-risk Medication   NON-GU PMH: Encounter for general adult medical examination without abnormal findings, Encounter for preventive health examination - 2015 Osteopenia, Osteopenia - 2014 Personal history of other diseases of the circulatory system, History of hypertension - 2014 Personal history of other endocrine, nutritional and metabolic disease, History of hypercholesterolemia - 2014 Personal history of other infectious and parasitic diseases, History of hepatitis - 2014    FAMILY HISTORY: 2 daughters - Daughter Death In The Family Father - Father Death In The Family Mother - Mother   SOCIAL HISTORY: Marital Status: Widowed Preferred Language: English; Race: White Current Smoking Status: Patient does not smoke anymore. Has not smoked since 04/17/2009. Smoked for 20 years. Smoked 2 packs per day.   Tobacco Use Assessment Completed: Used  Tobacco in last 30 days? Does not drink anymore.  Drinks 1 caffeinated drink per day.     Notes: Former smoker, Marital History - Single, Alcohol Use, Tobacco Use, Caffeine Use   REVIEW OF SYSTEMS:    GU Review Male:   Patient reports trouble starting your stream, frequent urination, hard  to postpone urination, and get up at night to urinate. Patient denies leakage of urine, burning/ pain with urination, have to strain to urinate , stream starts and stops, penile pain, and erection problems.  Gastrointestinal (Upper):   Patient denies nausea, vomiting, and indigestion/ heartburn.  Gastrointestinal (Lower):   Patient reports constipation. Patient denies diarrhea.  Constitutional:   Patient denies fever, night sweats, weight loss, and fatigue.  Skin:   Patient denies skin rash/ lesion and itching.  Eyes:   Patient denies blurred vision and double vision.  Ears/ Nose/ Throat:   Patient denies sore throat and sinus problems.  Hematologic/Lymphatic:   Patient denies swollen glands and easy bruising.  Cardiovascular:   Patient denies leg swelling and chest pains.  Respiratory:   Patient denies cough and shortness of breath.  Endocrine:   Patient denies excessive thirst.  Musculoskeletal:   Patient reports joint pain. Patient denies back pain.  Neurological:   Patient reports dizziness. Patient denies headaches.  Psychologic:   Patient denies depression and anxiety.   Notes: Patient is urinating 3-5x during the night.    VITAL SIGNS:      05/15/2019 09:41 AM  Weight 182 lb / 82.55 kg  Height 67 in / 170.18 cm  BP 93/64 mmHg  Heart Rate 73 /min  Temperature 97.8 F / 36.5 C  BMI 28.5 kg/m   MULTI-SYSTEM PHYSICAL EXAMINATION:    Constitutional: The patient appears tired and deconditioned. He has a bruise under his right eye  Respiratory: Normal breath sounds. No labored breathing, no use of accessory muscles.   Cardiovascular: Regular rate and rhythm. No murmur, no gallop. Normal temperature, normal extremity pulses, no swelling, no varicosities.      PAST DATA REVIEWED:  Source Of History:  Patient   11/15/14 05/23/14 02/02/13 05/25/12 10/20/11 04/02/11 09/15/10 03/04/10  PSA  Total PSA 1.50  1.45  1.07  1.93  2.06  2.14  2.06  2.10     PROCEDURES:           Urinalysis w/Scope Dipstick Dipstick Cont'd Micro  Color: Yellow Bilirubin: Neg mg/dL WBC/hpf: >60/hpf  Appearance: Cloudy Ketones: Neg mg/dL RBC/hpf: 10 - 20/hpf  Specific Gravity: 1.020 Blood: 3+ ery/uL Bacteria: Few (10-25/hpf)  pH: 6.0 Protein: 3+ mg/dL Cystals: NS (Not Seen)  Glucose: Neg mg/dL Urobilinogen: 0.2 mg/dL Casts: NS (Not Seen)    Nitrites: Neg Trichomonas: Not Present    Leukocyte Esterase: 3+ leu/uL Mucous: Not Present      Epithelial Cells: 0 - 5/hpf      Yeast: NS (Not Seen)      Sperm: Not Present    Notes: Microscopic not concentrated    ASSESSMENT:      ICD-10 Details  1 GU:   Bladder Stone - N21.0   2   Gross hematuria - R31.0    PLAN:           Orders Labs Urine Culture          Schedule Return Visit/Planned Activity: ASAP - Schedule Surgery          Document Letter(s):  Created for Patient: Clinical Summary  Notes:   The patient has a large bladder stone with associated significant frequency and urgency. It is impacting his sleep and overall quality of life. He also has some difficulty swallowing or digestive in food resulting in some emesis.   The patient is scheduled for a GI evaluation on Friday to determine whether there is any additional treatment or procedures required. Our plan is to move forward with scheduling the bladder stone removal and adjust this procedure accordingly if GI decide that they need to do something prior to his bladder stone removal surgery. He does not need any additional workup or evaluation for medical clearance.

## 2019-06-01 NOTE — Transfer of Care (Signed)
Immediate Anesthesia Transfer of Care Note  Patient: Barry Swanson  Procedure(s) Performed: CYSTOSCOPY WITH LITHOLAPAXY (N/A )  Patient Location: PACU  Anesthesia Type:General  Level of Consciousness: awake, alert  and oriented  Airway & Oxygen Therapy: Patient Spontanous Breathing and Patient connected to face mask oxygen  Post-op Assessment: Report given to RN and Post -op Vital signs reviewed and stable  Post vital signs: Reviewed and stable  Last Vitals:  Vitals Value Taken Time  BP 85/60 06/01/19 1224  Temp    Pulse 72 06/01/19 1227  Resp 17 06/01/19 1227  SpO2 100 % 06/01/19 1227  Vitals shown include unvalidated device data.  Last Pain:  Vitals:   06/01/19 0720  TempSrc: Oral         Complications: none

## 2019-06-01 NOTE — Interval H&P Note (Signed)
History and Physical Interval Note:  06/01/2019 8:58 AM  Barry Swanson  has presented today for surgery, with the diagnosis of BLADDER STONE.  The various methods of treatment have been discussed with the patient and family. After consideration of risks, benefits and other options for treatment, the patient has consented to  Procedure(s): CYSTOSCOPY WITH LITHOLAPAXY (N/A) as a surgical intervention.  The patient's history has been reviewed, patient examined, no change in status, stable for surgery.  I have reviewed the patient's chart and labs.  Questions were answered to the patient's satisfaction.     Ardis Hughs

## 2019-06-02 ENCOUNTER — Observation Stay (HOSPITAL_COMMUNITY): Payer: Medicare Other

## 2019-06-02 ENCOUNTER — Encounter (HOSPITAL_COMMUNITY): Payer: Self-pay | Admitting: Urology

## 2019-06-02 DIAGNOSIS — R262 Difficulty in walking, not elsewhere classified: Secondary | ICD-10-CM | POA: Diagnosis not present

## 2019-06-02 DIAGNOSIS — B1910 Unspecified viral hepatitis B without hepatic coma: Secondary | ICD-10-CM | POA: Diagnosis not present

## 2019-06-02 DIAGNOSIS — N3289 Other specified disorders of bladder: Secondary | ICD-10-CM | POA: Diagnosis not present

## 2019-06-02 DIAGNOSIS — K224 Dyskinesia of esophagus: Secondary | ICD-10-CM | POA: Diagnosis not present

## 2019-06-02 DIAGNOSIS — Z8673 Personal history of transient ischemic attack (TIA), and cerebral infarction without residual deficits: Secondary | ICD-10-CM | POA: Diagnosis not present

## 2019-06-02 DIAGNOSIS — N401 Enlarged prostate with lower urinary tract symptoms: Secondary | ICD-10-CM | POA: Diagnosis not present

## 2019-06-02 DIAGNOSIS — Z79899 Other long term (current) drug therapy: Secondary | ICD-10-CM | POA: Diagnosis not present

## 2019-06-02 DIAGNOSIS — N211 Calculus in urethra: Secondary | ICD-10-CM | POA: Diagnosis not present

## 2019-06-02 DIAGNOSIS — R350 Frequency of micturition: Secondary | ICD-10-CM | POA: Diagnosis not present

## 2019-06-02 DIAGNOSIS — R310 Gross hematuria: Secondary | ICD-10-CM | POA: Diagnosis not present

## 2019-06-02 DIAGNOSIS — E7800 Pure hypercholesterolemia, unspecified: Secondary | ICD-10-CM | POA: Diagnosis not present

## 2019-06-02 DIAGNOSIS — K222 Esophageal obstruction: Secondary | ICD-10-CM | POA: Diagnosis not present

## 2019-06-02 DIAGNOSIS — Z8546 Personal history of malignant neoplasm of prostate: Secondary | ICD-10-CM | POA: Diagnosis not present

## 2019-06-02 DIAGNOSIS — R54 Age-related physical debility: Secondary | ICD-10-CM | POA: Diagnosis not present

## 2019-06-02 DIAGNOSIS — R3915 Urgency of urination: Secondary | ICD-10-CM | POA: Diagnosis not present

## 2019-06-02 DIAGNOSIS — Z87891 Personal history of nicotine dependence: Secondary | ICD-10-CM | POA: Diagnosis not present

## 2019-06-02 DIAGNOSIS — I10 Essential (primary) hypertension: Secondary | ICD-10-CM | POA: Diagnosis not present

## 2019-06-02 DIAGNOSIS — N210 Calculus in bladder: Secondary | ICD-10-CM | POA: Diagnosis not present

## 2019-06-02 LAB — BASIC METABOLIC PANEL
Anion gap: 9 (ref 5–15)
BUN: 17 mg/dL (ref 8–23)
CO2: 22 mmol/L (ref 22–32)
Calcium: 8.2 mg/dL — ABNORMAL LOW (ref 8.9–10.3)
Chloride: 106 mmol/L (ref 98–111)
Creatinine, Ser: 1.72 mg/dL — ABNORMAL HIGH (ref 0.61–1.24)
GFR calc Af Amer: 40 mL/min — ABNORMAL LOW (ref >60)
GFR calc non Af Amer: 35 mL/min — ABNORMAL LOW (ref >60)
Glucose, Bld: 117 mg/dL — ABNORMAL HIGH (ref 70–99)
Potassium: 3.6 mmol/L (ref 3.5–5.1)
Sodium: 137 mmol/L (ref 135–145)

## 2019-06-02 LAB — CBC
HCT: 41.1 % (ref 39.0–52.0)
Hemoglobin: 12.5 g/dL — ABNORMAL LOW (ref 13.0–17.0)
MCH: 30.5 pg (ref 26.0–34.0)
MCHC: 30.4 g/dL (ref 30.0–36.0)
MCV: 100.2 fL — ABNORMAL HIGH (ref 80.0–100.0)
Platelets: 180 10*3/uL (ref 150–400)
RBC: 4.1 MIL/uL — ABNORMAL LOW (ref 4.22–5.81)
RDW: 15.7 % — ABNORMAL HIGH (ref 11.5–15.5)
WBC: 7.1 10*3/uL (ref 4.0–10.5)
nRBC: 0 % (ref 0.0–0.2)

## 2019-06-02 MED ORDER — TAMSULOSIN HCL 0.4 MG PO CAPS: 0.4000 mg | ORAL_CAPSULE | ORAL | 0 refills | Status: AC

## 2019-06-02 MED ORDER — CHLORHEXIDINE GLUCONATE CLOTH 2 % EX PADS
6.0000 | MEDICATED_PAD | Freq: Every day | CUTANEOUS | Status: DC
  Administered 2019-06-02: 13:00:00 6 via TOPICAL

## 2019-06-02 NOTE — Discharge Instructions (Signed)
Transurethral Removal of Bladder Stone   General instructions:    Your recent bladder surgery requires very little post hospital care but some definite precautions.  Despite the fact that no skin incisions were used, the area around the bladder are raw and covered with scabs to promote healing and prevent bleeding. Certain precautions are needed to insure that the scabs are not disturbed over the next 2-4 weeks while the healing proceeds.  Because the raw surface inside your bladder and the irritating effects of urine you may expect frequency of urination and/or urgency (a stronger desire to urinate) and perhaps even getting up at night more often. This will usually resolve or improve slowly over the healing period. You may see some blood in your urine over the first 6 weeks. Do not be alarmed, even if the urine was clear for a while. Get off your feet and drink lots of fluids until clearing occurs. If you start to pass clots or don't improve call us.  Diet:  You may return to your normal diet immediately. Because of the raw surface of your bladder, alcohol, spicy foods, foods high in acid and drinks with caffeine may cause irritation or frequency and should be used in moderation. To keep your urine flowing freely and avoid constipation, drink plenty of fluids during the day (8-10 glasses). Tip: Avoid cranberry juice because it is very acidic.  Activity:  Your physical activity doesn't need to be restricted. However, if you are very active, you may see some blood in the urine. We suggest that you reduce your activity under the circumstances until the bleeding has stopped.  Bowels:  It is important to keep your bowels regular during the postoperative period. Straining with bowel movements can cause bleeding. A bowel movement every other day is reasonable. Use a mild laxative if needed, such as milk of magnesia 2-3 tablespoons, or 2 Dulcolax tablets. Call if you continue to have problems. If you  had been taking narcotics for pain, before, during or after your surgery, you may be constipated. Take a laxative if necessary.    Medication:  You should resume your pre-surgery medications unless told not to. In addition you may be given an antibiotic to prevent or treat infection. Antibiotics are not always necessary. All medication should be taken as prescribed until the bottles are finished unless you are having an unusual reaction to one of the drugs.    Foley Catheter Care A soft, flexible tube (Foley catheter) may have been placed in your bladder to drain urine and fluid. Follow these instructions: Taking Care of the Catheter  Keep the area where the catheter leaves your body clean.   Attach the catheter to the leg so there is no tension on the catheter.   Keep the drainage bag below the level of the bladder, but keep it OFF the floor.   Do not take long soaking baths. Your caregiver will give instructions about showering.   Wash your hands before touching ANYTHING related to the catheter or bag.   Using mild soap and warm water on a washcloth:   Clean the area closest to the catheter insertion site using a circular motion around the catheter.   Clean the catheter itself by wiping AWAY from the insertion site for several inches down the tube.   NEVER wipe upward as this could sweep bacteria up into the urethra (tube in your body that normally drains the bladder) and cause infection.   Place a small amount of  sterile lubricant at the tip of the penis where the catheter is entering.  Taking Care of the Drainage Bags  Two drainage bags may be taken home: a large overnight drainage bag, and a smaller leg bag which fits underneath clothing.   It is okay to wear the overnight bag at any time, but NEVER wear the smaller leg bag at night.   Keep the drainage bag well below the level of your bladder. This prevents backflow of urine into the bladder and allows the urine to drain  freely.   Anchor the tubing to your leg to prevent pulling or tension on the catheter. Use tape or a leg strap provided by the hospital.   Empty the drainage bag when it is 1/2 to 3/4 full. Wash your hands before and after touching the bag.   Periodically check the tubing for kinks to make sure there is no pressure on the tubing which could restrict the flow of urine.  Changing the Drainage Bags  Cleanse both ends of the clean bag with alcohol before changing.   Pinch off the rubber catheter to avoid urine spillage during the disconnection.   Disconnect the dirty bag and connect the clean one.   Empty the dirty bag carefully to avoid a urine spill.   Attach the new bag to the leg with tape or a leg strap.  Cleaning the Drainage Bags  Whenever a drainage bag is disconnected, it must be cleaned quickly so it is ready for the next use.   Wash the bag in warm, soapy water.   Rinse the bag thoroughly with warm water.   Soak the bag for 30 minutes in a solution of white vinegar and water (1 cup vinegar to 1 quart warm water).   Rinse with warm water.  SEEK MEDICAL CARE IF:   You have chills or night sweats.   You are leaking around your catheter or have problems with your catheter. It is not uncommon to have sporadic leakage around your catheter as a result of bladder spasms. If the leakage stops, there is not much need for concern. If you are uncertain, call your caregiver.   You develop side effects that you think are coming from your medicines.  SEEK IMMEDIATE MEDICAL CARE IF:   You are suddenly unable to urinate. Check to see if there are any kinks in the drainage tubing that may cause this. If you cannot find any kinks, call your caregiver immediately. This is an emergency.   You develop shortness of breath or chest pains.   Bleeding persists or clots develop in your urine.   You have a fever.   You develop pain in your back or over your lower belly (abdomen).   You  develop pain or swelling in your legs.   Any problems you are having get worse rather than better.  MAKE SURE YOU:   Understand these instructions.   Will watch your condition.   Will get help right away if you are not doing well or get worse.

## 2019-06-02 NOTE — Progress Notes (Signed)
Urology Inpatient Progress Report  BLADDER STONE  Procedure(s): CYSTOSCOPY WITH LITHOLAPAXY  1 Day Post-Op   Intv/Subj: No acute events overnight. Patient is without complaint.  Active Problems:   Bladder stone  Current Facility-Administered Medications  Medication Dose Route Frequency Provider Last Rate Last Dose  . acetaminophen (OFIRMEV) IV 1,000 mg  1,000 mg Intravenous Q6H Ardis Hughs, MD 400 mL/hr at 06/02/19 0543 1,000 mg at 06/02/19 0543  . amLODipine (NORVASC) tablet 10 mg  10 mg Oral Daily Ardis Hughs, MD      . Chlorhexidine Gluconate Cloth 2 % PADS 6 each  6 each Topical Daily Ardis Hughs, MD      . cholecalciferol (VITAMIN D3) tablet 5,000 Units  5,000 Units Oral Daily Ardis Hughs, MD      . finasteride (PROSCAR) tablet 5 mg  5 mg Oral QHS Ardis Hughs, MD   5 mg at 06/01/19 2011  . multivitamin with minerals tablet 1 tablet  1 tablet Oral Daily Ardis Hughs, MD      . simvastatin (ZOCOR) tablet 10 mg  10 mg Oral QHS Ardis Hughs, MD   10 mg at 06/01/19 2011  . sodium chloride irrigation 0.9 % 3,000 mL  3,000 mL Irrigation Continuous Ardis Hughs, MD   3,000 mL at 06/01/19 2244  . tamsulosin (FLOMAX) capsule 0.4 mg  0.4 mg Oral BID Ardis Hughs, MD   0.4 mg at 06/01/19 2011  . vitamin B-12 (CYANOCOBALAMIN) tablet 5,000 mcg  5,000 mcg Oral Daily Ardis Hughs, MD         Objective: Vital: Vitals:   06/01/19 1728 06/01/19 2026 06/01/19 2344 06/02/19 0354  BP: 119/76 98/69 117/65 (!) 94/57  Pulse: 70 75 81 75  Resp: 16 18 18 18   Temp: 97.9 F (36.6 C) 99.1 F (37.3 C) 98.9 F (37.2 C) 98.5 F (36.9 C)  TempSrc: Oral Axillary  Oral  SpO2: 100% 96% 100% 97%  Height: 5\' 7"  (1.702 m)      I/Os: I/O last 3 completed shifts: In: 14000 [I.V.:1700; Other:12000; IV Piggyback:300] Out: 08144 [Urine:17520]  Physical Exam:  General: Patient is in no apparent distress Lungs: Normal respiratory  effort, chest expands symmetrically. GI: The abdomen is soft and nontender without mass. Foley: turbid urine on slow CBI  Ext: lower extremities symmetric  Lab Results: Recent Labs    05/31/19 0853 06/02/19 0451  WBC 6.7 7.1  HGB 14.4 12.5*  HCT 47.2 41.1   Recent Labs    05/31/19 0853 06/02/19 0451  NA 141 137  K 4.6 3.6  CL 109 106  CO2 24 22  GLUCOSE 109* 117*  BUN 17 17  CREATININE 1.67* 1.72*  CALCIUM 8.9 8.2*   No results for input(s): LABPT, INR in the last 72 hours. No results for input(s): LABURIN in the last 72 hours. Results for orders placed or performed during the hospital encounter of 05/29/19  SARS CORONAVIRUS 2 (TAT 6-24 HRS) Nasopharyngeal Nasopharyngeal Swab     Status: None   Collection Time: 05/29/19  3:20 PM   Specimen: Nasopharyngeal Swab  Result Value Ref Range Status   SARS Coronavirus 2 NEGATIVE NEGATIVE Final    Comment: (NOTE) SARS-CoV-2 target nucleic acids are NOT DETECTED. The SARS-CoV-2 RNA is generally detectable in upper and lower respiratory specimens during the acute phase of infection. Negative results do not preclude SARS-CoV-2 infection, do not rule out co-infections with other pathogens, and should not be used  as the sole basis for treatment or other patient management decisions. Negative results must be combined with clinical observations, patient history, and epidemiological information. The expected result is Negative. Fact Sheet for Patients: SugarRoll.be Fact Sheet for Healthcare Providers: https://www.woods-mathews.com/ This test is not yet approved or cleared by the [place] FDA and  has been authorized for detection and/or diagnosis of SARS-CoV-2 by FDA under an Emergency Use Authorization (EUA). This EUA will remain  in effect (meaning this test can be used) for the duration of the COVID-19 declaration under Section 56 4(b)(1) of the Act, 21 U.S.C. section  360bbb-3(b)(1), unless the authorization is terminated or revoked sooner. Performed at [facility], [place] [address], [place], [place] [address]     Studies/Results: No results found.  Assessment: Procedure(s): CYSTOSCOPY WITH LITHOLAPAXY, 1 Day Post-Op  doing well.  Plan: Wean CBI off, remove 15cc (30 total) from foley balloon and plan to d/c w/ catheter.  Will need teaching. PT/OT eval for home health - catheter checks. Speech eval for barium swallow - c/o emesis every time he eats/attempts to swallow his medicine.  He had a telehealth consult with GI whom recommended this study.  Would like facilitate this so that he can move forward with this problem given difficulty they have getting out.  Hope to d/c after lunch once above has been sorted out.   Barry Meckel, MD Urology 06/02/2019, 7:38 AM

## 2019-06-02 NOTE — Discharge Summary (Signed)
Date of admission: 06/01/2019  Date of discharge: 06/02/2019  Admission diagnosis: Bladder stone  Discharge diagnosis: same, frailty, esophageal narrowing  Secondary diagnoses:  Patient Active Problem List   Diagnosis Date Noted  . Bladder stone 06/01/2019  . Sepsis ([place]) 04/30/2019  . Acute pyelonephritis 04/30/2019  . Acute kidney injury ([place]) 04/30/2019  . Confusion   . Essential hypertension   . Acute encephalopathy 02/11/2015  . TIA (transient ischemic attack) 02/11/2015  . Hypertension   . Hyperlipidemia   . Calculus (=stone) 11/29/2012  . Malignant neoplasm of prostate ([place]) 11/29/2012  . Bladder outflow obstruction 11/29/2012    Procedures performed: Procedure(s): CYSTOSCOPY WITH LITHOLAPAXY  History and Physical: For full details, please see admission history and physical. Briefly, Barry Swanson is a 84 y.o. year old patient with a large bladder stone.   Hospital Course: Patient tolerated the procedure well.  He was then transferred to the floor after an uneventful PACU stay.  His hospital course was uncomplicated.  On POD#1 he had met discharge criteria: was eating a regular diet, was up and ambulating independently,  pain was well controlled,  and was ready to for discharge.  Discharged home with a foley and will follow-up Monday for removal Barium Swallow done while in the hospital showing distal esophageal stricture.   Laboratory values:  Recent Labs    05/31/19 0853 06/02/19 0451  WBC 6.7 7.1  HGB 14.4 12.5*  HCT 47.2 41.1   Recent Labs    05/31/19 0853 06/02/19 0451  NA 141 137  K 4.6 3.6  CL 109 106  CO2 24 22  GLUCOSE 109* 117*  BUN 17 17  CREATININE 1.67* 1.72*  CALCIUM 8.9 8.2*   No results for input(s): LABPT, INR in the last 72 hours. No results for input(s): LABURIN in the last 72 hours. Results for orders placed or performed during the hospital encounter of 05/29/19  SARS CORONAVIRUS 2 (TAT 6-24 HRS) Nasopharyngeal Nasopharyngeal  Swab     Status: None   Collection Time: 05/29/19  3:20 PM   Specimen: Nasopharyngeal Swab  Result Value Ref Range Status   SARS Coronavirus 2 NEGATIVE NEGATIVE Final    Comment: (NOTE) SARS-CoV-2 target nucleic acids are NOT DETECTED. The SARS-CoV-2 RNA is generally detectable in upper and lower respiratory specimens during the acute phase of infection. Negative results do not preclude SARS-CoV-2 infection, do not rule out co-infections with other pathogens, and should not be used as the sole basis for treatment or other patient management decisions. Negative results must be combined with clinical observations, patient history, and epidemiological information. The expected result is Negative. Fact Sheet for Patients: SugarRoll.be Fact Sheet for Healthcare Providers: https://www.woods-mathews.com/ This test is not yet approved or cleared by the [place] FDA and  has been authorized for detection and/or diagnosis of SARS-CoV-2 by FDA under an Emergency Use Authorization (EUA). This EUA will remain  in effect (meaning this test can be used) for the duration of the COVID-19 declaration under Section 56 4(b)(1) of the Act, 21 U.S.C. section 360bbb-3(b)(1), unless the authorization is terminated or revoked sooner. Performed at [facility], [place] [address] [place], [place] [address]     Disposition: Home  Discharge instruction: The patient was instructed to be ambulatory but told to refrain from heavy lifting, strenuous activity, or driving.   Discharge medications:  Allergies as of 06/02/2019   No Known Allergies     Medication List    TAKE these medications   amLODipine  10 MG tablet Commonly known as: NORVASC Take 1 tablet (10 mg total) by mouth daily.   aspirin 81 MG chewable tablet Chew 4 tablets (324 mg total) by mouth daily.   Centrum Silver tablet Take 1 tablet by mouth daily. Notes to patient: Last Dose of this  Medication was given on 06/02/2019 at 12:49 pm   COQ10 PO Take 1 tablet by mouth at bedtime. Notes to patient: Please take this Medication tonight at bedtime 10/16/2020t bedtime   finasteride 5 MG tablet Commonly known as: PROSCAR Take 5 mg by mouth at bedtime. Notes to patient: Please take this Medication tonight at bedtime 06/04/2019    gabapentin 300 MG capsule Commonly known as: NEURONTIN Take 300 mg by mouth as needed (pain).   Garlic 8381 MG Tbec Take 2,000 mg by mouth daily.   simvastatin 10 MG tablet Commonly known as: ZOCOR Take 10 mg by mouth at bedtime. Notes to patient: Please take this Medication tonight 06/02/2019 at bedtime   tamsulosin 0.4 MG Caps capsule Commonly known as: FLOMAX Take 1 capsule (0.4 mg total) by mouth 1 day or 1 dose for 1 dose. What changed: when to take this Notes to patient: This Medication is to be taken for 1 day 06/03/2019    Vitamin B-12 5000 MCG Tbdp Take 5,000 mcg by mouth daily. Notes to patient: Last Dose of this Medication was given on 06/02/2019 at 12:49 pm   Vitamin D-3 125 MCG (5000 UT) Tabs Take 5,000 mg by mouth daily. Notes to patient: Last Dose of this Medication was given on 06/02/2019 at 12:50 pm       Followup:  Follow-up Information    Karen Kays, NP On 06/15/2019.   Specialty: Nurse Practitioner Why: 10:45am Contact information: [address] [place] [address] [place]. Schedule an appointment as soon as possible for a visit on 06/05/2019.   Why: Catheter removal Contact information: [place], [facility] [place] Follow up.   Specialty: [place] Why: [facility] nursing-f/c managment/physical therapy/occupational therapy/speech therapy Contact information: [address] [place] [id number] [phone]

## 2019-06-02 NOTE — TOC Initial Note (Signed)
Transition of Care [facility]) - Initial/Assessment Note    Patient Details  Name: Barry Swanson MRN: [id number] Date of Birth: April 18, 1929  Transition [facility]) CM/SW Contact:    Dessa Phi, RN Phone Number: 06/02/2019, 12:41 PM  Clinical Narrative:   D/c home w/HHC-already active w/[facility] aware. Ordered for HHRN/f/c management,HHPT/OT/ST. No further CM needs.                Expected Discharge Plan: [place] Barriers to Discharge: No Barriers Identified   Patient Goals and CMS Choice Patient states their goals for this hospitalization and ongoing recovery are:: go home CMS Medicare.gov Compare Post Acute Care list provided to:: Patient Represenative (must comment)    Expected Discharge Plan and Services Expected Discharge Plan: [place]   Discharge Planning Services: CM Consult Post Acute Care Choice: [facility]) Living arrangements for the past 2 months: Single Family Home Expected Discharge Date: 06/02/19                         HH Arranged: RN, PT, OT, Speech Therapy HH Agency: [facility] Date [place]: 06/02/19 Time [place]: 109 Representative spoke with at [place]: [name]  Prior Living Arrangements/Services Living arrangements for the past 2 months: [place] with:: Spouse Patient language and need for interpreter reviewed:: No Do you feel safe going back to the place where you live?: Yes      Need for Family Participation in Patient Care: No (Comment) Care giver support system in place?: Yes (comment) Current home services: Home OT, Home PT, Other (comment)(HH ST) Criminal Activity/Legal Involvement Pertinent to Current Situation/Hospitalization: No - Comment as needed  Activities of Daily Living Home Assistive Devices/Equipment: Eyeglasses, Environmental consultant (specify type), Blood pressure cuff, Built-in shower seat, Grab bars in shower,  Dentures (specify type) ADL Screening (condition at time of admission) Patient's cognitive ability adequate to safely complete daily activities?: Yes Is the patient deaf or have difficulty hearing?: Yes Does the patient have difficulty seeing, even when wearing glasses/contacts?: No Does the patient have difficulty concentrating, remembering, or making decisions?: Yes Patient able to express need for assistance with ADLs?: Yes Does the patient have difficulty dressing or bathing?: Yes Independently performs ADLs?: No Communication: Independent Dressing (OT): Needs assistance Is this a change from baseline?: Pre-admission baseline Grooming: Independent Feeding: Independent Bathing: Needs assistance Is this a change from baseline?: Pre-admission baseline Toileting: Independent In/Out Bed: Independent Walks in Home: Independent with device (comment) Does the patient have difficulty walking or climbing stairs?: Yes Weakness of Legs: Both Weakness of Arms/Hands: None  Permission Sought/Granted Permission sought to share information with : Case Manager Permission granted to share information with : Yes, Verbal Permission Granted  Share Information with NAME: Case Manager Juliann Pulse           Emotional Assessment Appearance:: Appears stated age Attitude/Demeanor/Rapport: Gracious Affect (typically observed): Accepting Orientation: : Oriented to Self, Oriented to Place Alcohol / Substance Use: Not Applicable Psych Involvement: No (comment)  Admission diagnosis:  BLADDER STONE Patient Active Problem List   Diagnosis Date Noted  . Bladder stone 06/01/2019  . Sepsis ([place]) 04/30/2019  . Acute pyelonephritis 04/30/2019  . Acute kidney injury ([place]) 04/30/2019  . Confusion   . Essential hypertension   . Acute encephalopathy 02/11/2015  . TIA (transient ischemic attack) 02/11/2015  . Hypertension   . Hyperlipidemia   . Calculus (=stone) 11/29/2012  .  Malignant neoplasm of prostate  ([place]) 11/29/2012  . Bladder outflow obstruction 11/29/2012   PCP:  [name], MD Pharmacy:   [facility] [place] [address] Phone: [phone] Fax: [phone]     Social Determinants of Health (SDOH) Interventions    Readmission Risk Interventions Readmission Risk Prevention Plan 05/03/2019  Transportation Screening Complete  PCP or Specialist Appt within 3-5 Days Complete  HRI or [place] Complete  Social Work Consult for [place] Planning/Counseling Complete  Palliative Care Screening Not Applicable  Medication Review Press photographer) Complete  Some recent data might be hidden

## 2019-06-02 NOTE — Evaluation (Signed)
Physical Therapy One Time Evaluation Patient Details Name: Barry Swanson MRN: [id number] DOB: [birth year] Today's Date: 06/02/2019   History of Present Illness  85 y.o. male with medical history significant of hypertension, CVA, hyperlipidemia, hepatitis B, chronic low back pain, pancreatic cancer and osteoarthritis who was recently diagnosed with UTI and bladder stone with UTI and now s/p CYSTOSCOPY WITH LITHOLAPAXY  Clinical Impression  Patient evaluated by Physical Therapy with no further acute PT needs identified. All education has been completed and the patient has no further questions.  Pt with chronic back pain and limited activity at home at baseline.  Pt able to ambulate short distance with RW and spouse can assist at home if needed.  Pt likely to d/c home later today. See below for any follow-up Physical Therapy or equipment needs. PT is signing off. Thank you for this referral.     Follow Up Recommendations No PT follow up    Equipment Recommendations  None recommended by PT    Recommendations for Other Services       Precautions / Restrictions Precautions Precautions: Fall Restrictions Weight Bearing Restrictions: No      Mobility  Bed Mobility               General bed mobility comments: pt sitting EOB on arrival with RN and spouse  Transfers Overall transfer level: Needs assistance Equipment used: Rolling walker (2 wheeled) Transfers: Sit to/from Stand Sit to Stand: Min guard         General transfer comment: min/guard for safety, slow to transfer due to back pain however no physical assist required  Ambulation/Gait Ambulation/Gait assistance: Min guard Gait Distance (Feet): 40 Feet Assistive device: Rolling walker (2 wheeled) Gait Pattern/deviations: Step-through pattern;Decreased stride length;Trunk flexed     General Gait Details: pt with forward flexed posture due to back pain, no unsteadiness or dizziness reported, distance is about pt's  baseline per 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    Modified Rankin (Stroke Patients Only)       Balance Overall balance assessment: Needs assistance         Standing balance support: Bilateral upper extremity supported Standing balance-Leahy Scale: Poor Standing balance comment: requires UE support                             Pertinent Vitals/Pain Pain Assessment: Faces Faces Pain Scale: Hurts little more Pain Location: low back (chronic) Pain Descriptors / Indicators: Sore Pain Intervention(s): Repositioned;Monitored during session    Home Living Family/patient expects to be discharged to:: Private residence Living Arrangements: Spouse/significant other Available Help at Discharge: Family;Available 24 hours/day Type of Home: House Home Access: Stairs to enter Entrance Stairs-Rails: None Entrance Stairs-Number of Steps: 2 Home Layout: One level Home Equipment: Walker - 4 wheels;Walker - 2 wheels;Shower seat - built in      Prior Function Level of Independence: Needs assistance   Gait / Transfers Assistance Needed: mod I with RW or rollator  ADL's / Homemaking Assistance Needed: spouse assists with all ADL tasks         Hand Dominance        Extremity/Trunk Assessment        Lower Extremity Assessment Lower Extremity Assessment: Overall WFL for tasks assessed    Cervical / Trunk Assessment Cervical / Trunk Assessment: Kyphotic  Communication   Communication: HOH  Cognition Arousal/Alertness: Awake/alert Behavior  During Therapy: WFL for tasks assessed/performed Overall Cognitive Status: Within Functional Limits for tasks assessed                                        General Comments      Exercises     Assessment/Plan    PT Assessment Patent does not need any further PT services  PT Problem List         PT Treatment Interventions      PT Goals (Current goals can be found in the  Care Plan section)  Acute Rehab PT Goals PT Goal Formulation: All assessment and education complete, DC therapy    Frequency     Barriers to discharge        Co-evaluation               AM-PAC PT "6 Clicks" Mobility  Outcome Measure Help needed turning from your back to your side while in a flat bed without using bedrails?: A Little Help needed moving from lying on your back to sitting on the side of a flat bed without using bedrails?: A Little Help needed moving to and from a bed to a chair (including a wheelchair)?: A Little Help needed standing up from a chair using your arms (e.g., wheelchair or bedside chair)?: A Little Help needed to walk in hospital room?: A Little Help needed climbing 3-5 steps with a railing? : A Little 6 Click Score: 18    End of Session Equipment Utilized During Treatment: Gait belt Activity Tolerance: Patient tolerated treatment well Patient left: with call bell/phone within reach;in bed;with nursing/sitter in room   PT Visit Diagnosis: Difficulty in walking, not elsewhere classified (R26.2)    Time: 1057-1110 PT Time Calculation (min) (ACUTE ONLY): 13 min   Charges:   PT Evaluation $PT Eval Low Complexity: [place], PT, DPT Acute Rehabilitation Services Office: [phone] Pager: [phone]     Trena Platt 06/02/2019, 12:31 PM

## 2019-06-02 NOTE — Progress Notes (Signed)
Pt to be discharged to home this afternoon. Discharge Instructions reviewed with Pt and Pt's Wife. Pt to be discharged with foley catheter Wife given teaching/instructions regarding emptying of foley care. Pt's Wife able to return demonstration of emptying foley bag. Medications  reviewed with Pt and Pt's Wife. Per Urology MD Dr. Louis Meckel Pt instructed to take only 81 mg ASA till follow up Monday with Urology. Pt and Pt's Wife verbalized understanding of all discharge teaching. Discharge packet with Prescription with Pt at time of discharge.

## 2019-06-02 NOTE — Progress Notes (Signed)
PT in to assess Pt's mobility BP 85/52 Pulse 72. Manuel BP 80/50. Pt without any other acute changes noted in assessment. MD made aware via phone.  Orthostatic Vitals done and recorded. Per pt's Wife now in the room "His blood pressure has been running low recently and the Doctor took him off his blood pressure medications"

## 2019-06-07 ENCOUNTER — Other Ambulatory Visit: Payer: Medicare Other

## 2019-06-09 DIAGNOSIS — I10 Essential (primary) hypertension: Secondary | ICD-10-CM | POA: Diagnosis not present

## 2019-06-09 DIAGNOSIS — N132 Hydronephrosis with renal and ureteral calculous obstruction: Secondary | ICD-10-CM | POA: Diagnosis not present

## 2019-06-09 DIAGNOSIS — E876 Hypokalemia: Secondary | ICD-10-CM | POA: Diagnosis not present

## 2019-06-09 DIAGNOSIS — N401 Enlarged prostate with lower urinary tract symptoms: Secondary | ICD-10-CM | POA: Diagnosis not present

## 2019-06-09 DIAGNOSIS — M545 Low back pain: Secondary | ICD-10-CM | POA: Diagnosis not present

## 2019-06-09 DIAGNOSIS — N39498 Other specified urinary incontinence: Secondary | ICD-10-CM | POA: Diagnosis not present

## 2019-06-10 DIAGNOSIS — B1910 Unspecified viral hepatitis B without hepatic coma: Secondary | ICD-10-CM | POA: Diagnosis not present

## 2019-06-10 DIAGNOSIS — Z87440 Personal history of urinary (tract) infections: Secondary | ICD-10-CM | POA: Diagnosis not present

## 2019-06-10 DIAGNOSIS — M1990 Unspecified osteoarthritis, unspecified site: Secondary | ICD-10-CM | POA: Diagnosis not present

## 2019-06-10 DIAGNOSIS — E876 Hypokalemia: Secondary | ICD-10-CM | POA: Diagnosis not present

## 2019-06-10 DIAGNOSIS — E785 Hyperlipidemia, unspecified: Secondary | ICD-10-CM | POA: Diagnosis not present

## 2019-06-10 DIAGNOSIS — N132 Hydronephrosis with renal and ureteral calculous obstruction: Secondary | ICD-10-CM | POA: Diagnosis not present

## 2019-06-10 DIAGNOSIS — Z8507 Personal history of malignant neoplasm of pancreas: Secondary | ICD-10-CM | POA: Diagnosis not present

## 2019-06-10 DIAGNOSIS — I10 Essential (primary) hypertension: Secondary | ICD-10-CM | POA: Diagnosis not present

## 2019-06-10 DIAGNOSIS — N401 Enlarged prostate with lower urinary tract symptoms: Secondary | ICD-10-CM | POA: Diagnosis not present

## 2019-06-10 DIAGNOSIS — M545 Low back pain: Secondary | ICD-10-CM | POA: Diagnosis not present

## 2019-06-10 DIAGNOSIS — Z8546 Personal history of malignant neoplasm of prostate: Secondary | ICD-10-CM | POA: Diagnosis not present

## 2019-06-10 DIAGNOSIS — N39498 Other specified urinary incontinence: Secondary | ICD-10-CM | POA: Diagnosis not present

## 2019-06-10 DIAGNOSIS — Z8673 Personal history of transient ischemic attack (TIA), and cerebral infarction without residual deficits: Secondary | ICD-10-CM | POA: Diagnosis not present

## 2019-06-10 DIAGNOSIS — G8929 Other chronic pain: Secondary | ICD-10-CM | POA: Diagnosis not present

## 2019-06-10 DIAGNOSIS — Z9181 History of falling: Secondary | ICD-10-CM | POA: Diagnosis not present

## 2019-06-15 ENCOUNTER — Other Ambulatory Visit: Payer: Self-pay | Admitting: Gastroenterology

## 2019-06-19 ENCOUNTER — Other Ambulatory Visit: Payer: Self-pay | Admitting: Gastroenterology

## 2019-06-20 DIAGNOSIS — M545 Low back pain: Secondary | ICD-10-CM | POA: Diagnosis not present

## 2019-06-20 DIAGNOSIS — N39498 Other specified urinary incontinence: Secondary | ICD-10-CM | POA: Diagnosis not present

## 2019-06-20 DIAGNOSIS — N132 Hydronephrosis with renal and ureteral calculous obstruction: Secondary | ICD-10-CM | POA: Diagnosis not present

## 2019-06-20 DIAGNOSIS — N401 Enlarged prostate with lower urinary tract symptoms: Secondary | ICD-10-CM | POA: Diagnosis not present

## 2019-06-20 DIAGNOSIS — I10 Essential (primary) hypertension: Secondary | ICD-10-CM | POA: Diagnosis not present

## 2019-06-20 DIAGNOSIS — E876 Hypokalemia: Secondary | ICD-10-CM | POA: Diagnosis not present

## 2019-06-26 ENCOUNTER — Other Ambulatory Visit (HOSPITAL_COMMUNITY)
Admission: RE | Admit: 2019-06-26 | Discharge: 2019-06-26 | Disposition: A | Discharge status: Home / Self Care | Payer: Medicare Other | Source: Ambulatory Visit | Attending: Gastroenterology | Admitting: Gastroenterology

## 2019-06-26 DIAGNOSIS — Z01812 Encounter for preprocedural laboratory examination: Secondary | ICD-10-CM | POA: Insufficient documentation

## 2019-06-26 DIAGNOSIS — Z20828 Contact with and (suspected) exposure to other viral communicable diseases: Secondary | ICD-10-CM | POA: Insufficient documentation

## 2019-06-27 LAB — NOVEL CORONAVIRUS, NAA (HOSP ORDER, SEND-OUT TO REF LAB; TAT 18-24 HRS): SARS-CoV-2, NAA: NOT DETECTED

## 2019-06-28 ENCOUNTER — Other Ambulatory Visit: Payer: Self-pay

## 2019-06-28 ENCOUNTER — Encounter (HOSPITAL_COMMUNITY): Payer: Self-pay | Admitting: *Deleted

## 2019-06-29 ENCOUNTER — Ambulatory Visit (HOSPITAL_COMMUNITY): Payer: Medicare Other | Admitting: Anesthesiology

## 2019-06-29 ENCOUNTER — Other Ambulatory Visit: Payer: Self-pay | Admitting: Gastroenterology

## 2019-06-29 ENCOUNTER — Encounter (HOSPITAL_COMMUNITY): Payer: Self-pay | Admitting: *Deleted

## 2019-06-29 ENCOUNTER — Encounter (HOSPITAL_COMMUNITY)
Admission: RE | Disposition: A | Discharge status: Home / Self Care | Payer: Self-pay | Source: Home / Self Care | Attending: Gastroenterology

## 2019-06-29 ENCOUNTER — Ambulatory Visit (HOSPITAL_COMMUNITY)
Admission: RE | Admit: 2019-06-29 | Discharge: 2019-06-29 | Disposition: A | Discharge status: Home / Self Care | Payer: Medicare Other | Attending: Gastroenterology | Admitting: Gastroenterology

## 2019-06-29 DIAGNOSIS — K449 Diaphragmatic hernia without obstruction or gangrene: Secondary | ICD-10-CM | POA: Diagnosis not present

## 2019-06-29 DIAGNOSIS — K222 Esophageal obstruction: Secondary | ICD-10-CM | POA: Diagnosis not present

## 2019-06-29 DIAGNOSIS — E785 Hyperlipidemia, unspecified: Secondary | ICD-10-CM | POA: Diagnosis not present

## 2019-06-29 DIAGNOSIS — Z8673 Personal history of transient ischemic attack (TIA), and cerebral infarction without residual deficits: Secondary | ICD-10-CM | POA: Insufficient documentation

## 2019-06-29 DIAGNOSIS — I10 Essential (primary) hypertension: Secondary | ICD-10-CM | POA: Diagnosis not present

## 2019-06-29 DIAGNOSIS — Z87891 Personal history of nicotine dependence: Secondary | ICD-10-CM | POA: Diagnosis not present

## 2019-06-29 DIAGNOSIS — R1310 Dysphagia, unspecified: Secondary | ICD-10-CM | POA: Diagnosis present

## 2019-06-29 DIAGNOSIS — K224 Dyskinesia of esophagus: Secondary | ICD-10-CM | POA: Diagnosis not present

## 2019-06-29 HISTORY — PX: ESOPHAGOGASTRODUODENOSCOPY (EGD) WITH PROPOFOL: SHX5813

## 2019-06-29 HISTORY — PX: BALLOON DILATION: SHX5330

## 2019-06-29 SURGERY — ESOPHAGOGASTRODUODENOSCOPY (EGD) WITH PROPOFOL
Anesthesia: Monitor Anesthesia Care

## 2019-06-29 MED ORDER — PROPOFOL 10 MG/ML IV BOLUS
INTRAVENOUS | Status: AC
  Filled 2019-06-29: qty 40

## 2019-06-29 MED ORDER — SODIUM CHLORIDE 0.9 % IV SOLN
INTRAVENOUS | Status: DC
  Administered 2019-06-29: 14:00:00 via INTRAVENOUS

## 2019-06-29 MED ORDER — PROPOFOL 500 MG/50ML IV EMUL
INTRAVENOUS | Status: DC | PRN
  Administered 2019-06-29: 150 ug/kg/min via INTRAVENOUS

## 2019-06-29 MED ORDER — PROPOFOL 500 MG/50ML IV EMUL
INTRAVENOUS | Status: DC | PRN
  Administered 2019-06-29: 30 mg via INTRAVENOUS

## 2019-06-29 SURGICAL SUPPLY — 15 items

## 2019-06-29 NOTE — Discharge Instructions (Signed)

## 2019-06-29 NOTE — H&P (Signed)
The patient is an 85 year old male who presents to the outpatient endoscopy department for EGD with possible esophageal dilatation.  He has been experiencing dysphagia to solid foods and liquids for quite some time.  A barium swallow was done showing esophageal dysmotility but also there was a question of the distal esophageal stenosis however we were not clear on this and endoscopy was recommended.  This was discussed with patient with possible dilatation of the distal stenosis if found.  This could also be dysphagia from a motility disorder of the esophagus.  Physical  No distress  Heart regular rhythm  Lungs clear  Abdomen soft and nontender  Impression: Dysphagia with esophageal dysmotility and rule out distal esophageal stenosis  Plan: EGD with possible dilatation if a stricture is found.

## 2019-06-29 NOTE — Op Note (Signed)
[facility] Patient Name: Barry Swanson Procedure Date: 06/29/2019 MRN: [id number] Attending MD: Wonda Horner , MD Date of Birth: [birth year] CSN: [id number] Age: 85 Admit Type: Outpatient Procedure:                Upper GI endoscopy Indications:              Dysphagia Providers:                Wonda Horner, MD, Burtis Junes, RN, Marguerita Merles,                            Technician Referring MD:              Medicines:                Propofol per Anesthesia Complications:            No immediate complications. Estimated Blood Loss:     Estimated blood loss was minimal. Procedure:                Pre-Anesthesia Assessment:                           - Prior to the procedure, a History and Physical                            was performed, and patient medications and                            allergies were reviewed. The patient's tolerance of                            previous anesthesia was also reviewed. The risks                            and benefits of the procedure and the sedation                            options and risks were discussed with the patient.                            All questions were answered, and informed consent                            was obtained. Prior Anticoagulants: The patient has                            taken no previous anticoagulant or antiplatelet                            agents. ASA Grade Assessment: III - A patient with                            severe systemic disease. After reviewing the risks  and benefits, the patient was deemed in                            satisfactory condition to undergo the procedure.                           After obtaining informed consent, the endoscope was                            passed under direct vision. Throughout the                            procedure, the patient's blood pressure, pulse, and                            oxygen saturations were monitored  continuously. The                            GIF-H190 ([id number] Olympus gastroscope was                            introduced through the mouth, and advanced to the                            second part of duodenum. The upper GI endoscopy was                            accomplished without difficulty. The patient                            tolerated the procedure well. Scope In: Scope Out: Findings:      One mild stenosis was found. This was at the [place] junction area. 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 A TTS dilator was passed through the       scope. Dilation with a 15-16.5-18 mm balloon dilator was performed to       16.5 mm.      A small hiatal hernia was present.      Normal mucosa was found in the entire examined stomach.      The examined duodenum was normal. Impression:               - Esophageal stenosis. Dilated.                           - Small hiatal hernia.                           - Normal mucosa was found in the entire stomach.                           - Normal examined duodenum.                           - No specimens collected. Moderate Sedation:      . Recommendation:           -  Advance diet as tolerated.                           - Continue present medications.                           - Return to my office PRN.                           - Observe response to diation Procedure Code(s):        --- Professional ---                           [phone], Esophagogastroduodenoscopy, flexible,                            transoral; with transendoscopic balloon dilation of                            esophagus (less than 30 mm diameter) Diagnosis Code(s):        --- Professional ---                           K22.2, Esophageal obstruction                           K44.9, Diaphragmatic hernia without obstruction or                            gangrene                           R13.10, Dysphagia, unspecified CPT copyright 2019 [employer]. All  rights reserved. The codes documented in this report are preliminary and upon coder review may  be revised to meet current compliance requirements. Wonda Horner, MD 06/29/2019 2:43:27 PM This report has been signed electronically. Number of Addenda: 0

## 2019-06-29 NOTE — Anesthesia Preprocedure Evaluation (Addendum)
Anesthesia Evaluation  Patient identified by MRN, date of birth, ID band Patient awake    Reviewed: Allergy & Precautions, NPO status , Patient's Chart, lab work & pertinent test results  Airway Mallampati: I  TM Distance: >3 FB Neck ROM: Full    Dental  (+) Edentulous Upper, Edentulous Lower   Pulmonary neg pulmonary ROS, former smoker,    Pulmonary exam normal breath sounds clear to auscultation       Cardiovascular hypertension, Normal cardiovascular exam Rhythm:Regular Rate:Normal  HLD  TTE 2016 Normal LVEF, valves ok   Neuro/Psych CVA, No Residual Symptoms negative psych ROS   GI/Hepatic negative GI ROS, (+) Hepatitis -, B  Endo/Other  negative endocrine ROS  Renal/GU Renal InsufficiencyRenal disease  negative genitourinary   Musculoskeletal negative musculoskeletal ROS (+)   Abdominal   Peds  Hematology negative hematology ROS (+)   Anesthesia Other Findings   Reproductive/Obstetrics                            Anesthesia Physical Anesthesia Plan  ASA: III  Anesthesia Plan: MAC   Post-op Pain Management:    Induction: Intravenous  PONV Risk Score and Plan: 1 and Propofol infusion and Treatment may vary due to age or medical condition  Airway Management Planned: Natural Airway  Additional Equipment:   Intra-op Plan:   Post-operative Plan:   Informed Consent: I have reviewed the patients History and Physical, chart, labs and discussed the procedure including the risks, benefits and alternatives for the proposed anesthesia with the patient or authorized representative who has indicated his/her understanding and acceptance.     Dental advisory given  Plan Discussed with: CRNA  Anesthesia Plan Comments:         Anesthesia Quick Evaluation

## 2019-06-29 NOTE — Transfer of Care (Signed)
Immediate Anesthesia Transfer of Care Note  Patient: Barry Swanson  Procedure(s) Performed: ESOPHAGOGASTRODUODENOSCOPY (EGD) WITH PROPOFOL WITH POSSIBLE DIL (N/A ) BALLOON DILATION (N/A )  Patient Location: PACU  Anesthesia Type:MAC  Level of Consciousness: sedated, patient cooperative and responds to stimulation  Airway & Oxygen Therapy: Patient Spontanous Breathing and Patient connected to face mask oxygen  Post-op Assessment: Report given to RN and Post -op Vital signs reviewed and stable  Post vital signs: Reviewed and stable  Last Vitals:  Vitals Value Taken Time  BP    Temp    Pulse 62 06/29/19 1438  Resp 19 06/29/19 1438  SpO2 99 % 06/29/19 1438  Vitals shown include unvalidated device data.  Last Pain:  Vitals:   06/29/19 1246  TempSrc: Oral  PainSc: 0-No pain         Complications: No apparent anesthesia complications

## 2019-06-29 NOTE — Anesthesia Postprocedure Evaluation (Signed)
Anesthesia Post Note  Patient: [name]  Procedure(s) Performed: ESOPHAGOGASTRODUODENOSCOPY (EGD) WITH PROPOFOL WITH POSSIBLE DIL (N/A ) BALLOON DILATION (N/A )     Patient location during evaluation: Endoscopy Anesthesia Type: MAC Level of consciousness: awake and alert Pain management: pain level controlled Vital Signs Assessment: post-procedure vital signs reviewed and stable Respiratory status: spontaneous breathing, nonlabored ventilation, respiratory function stable and patient connected to nasal cannula oxygen Cardiovascular status: blood pressure returned to baseline and stable Postop Assessment: no apparent nausea or vomiting Anesthetic complications: no    Last Vitals:  Vitals:   06/29/19 1439 06/29/19 1450  BP: (!) 153/71 132/69  Pulse: 62 (!) 58  Resp: 19 20  Temp: 36.7 C   SpO2: 99% 92%    Last Pain:  Vitals:   06/29/19 1450  TempSrc:   PainSc: 0-No pain                 Vin Yonke L Kamaury [facility]

## 2019-06-30 ENCOUNTER — Encounter (HOSPITAL_COMMUNITY): Payer: Self-pay | Admitting: Gastroenterology

## 2019-07-06 DIAGNOSIS — M810 Age-related osteoporosis without current pathological fracture: Secondary | ICD-10-CM | POA: Diagnosis not present

## 2019-07-06 DIAGNOSIS — M4857XS Collapsed vertebra, not elsewhere classified, lumbosacral region, sequela of fracture: Secondary | ICD-10-CM | POA: Diagnosis not present

## 2019-07-06 DIAGNOSIS — G8929 Other chronic pain: Secondary | ICD-10-CM | POA: Diagnosis not present

## 2019-07-06 DIAGNOSIS — I8310 Varicose veins of unspecified lower extremity with inflammation: Secondary | ICD-10-CM | POA: Diagnosis not present

## 2019-07-06 DIAGNOSIS — I10 Essential (primary) hypertension: Secondary | ICD-10-CM | POA: Diagnosis not present

## 2019-07-06 DIAGNOSIS — I739 Peripheral vascular disease, unspecified: Secondary | ICD-10-CM | POA: Diagnosis not present

## 2019-07-06 DIAGNOSIS — F329 Major depressive disorder, single episode, unspecified: Secondary | ICD-10-CM | POA: Diagnosis not present

## 2019-07-06 DIAGNOSIS — R634 Abnormal weight loss: Secondary | ICD-10-CM | POA: Diagnosis not present

## 2019-07-06 DIAGNOSIS — R531 Weakness: Secondary | ICD-10-CM | POA: Diagnosis not present

## 2019-07-06 DIAGNOSIS — R1310 Dysphagia, unspecified: Secondary | ICD-10-CM | POA: Diagnosis not present

## 2019-07-29 DIAGNOSIS — K222 Esophageal obstruction: Secondary | ICD-10-CM | POA: Diagnosis not present

## 2019-08-08 ENCOUNTER — Inpatient Hospital Stay (HOSPITAL_COMMUNITY)
Admission: EM | Admit: 2019-08-08 | Discharge: 2019-08-15 | DRG: 689 | Disposition: A | Discharge status: Home Health | Payer: Medicare Other | Attending: Internal Medicine | Admitting: Internal Medicine

## 2019-08-08 ENCOUNTER — Emergency Department (HOSPITAL_COMMUNITY): Payer: Medicare Other

## 2019-08-08 ENCOUNTER — Other Ambulatory Visit: Payer: Self-pay

## 2019-08-08 ENCOUNTER — Observation Stay (HOSPITAL_COMMUNITY): Payer: Medicare Other

## 2019-08-08 DIAGNOSIS — K219 Gastro-esophageal reflux disease without esophagitis: Secondary | ICD-10-CM | POA: Diagnosis present

## 2019-08-08 DIAGNOSIS — I82462 Acute embolism and thrombosis of left calf muscular vein: Secondary | ICD-10-CM | POA: Diagnosis not present

## 2019-08-08 DIAGNOSIS — N183 Chronic kidney disease, stage 3 unspecified: Secondary | ICD-10-CM | POA: Diagnosis present

## 2019-08-08 DIAGNOSIS — R4182 Altered mental status, unspecified: Secondary | ICD-10-CM | POA: Diagnosis not present

## 2019-08-08 DIAGNOSIS — N400 Enlarged prostate without lower urinary tract symptoms: Secondary | ICD-10-CM | POA: Diagnosis present

## 2019-08-08 DIAGNOSIS — N1330 Unspecified hydronephrosis: Secondary | ICD-10-CM | POA: Diagnosis not present

## 2019-08-08 DIAGNOSIS — R1310 Dysphagia, unspecified: Secondary | ICD-10-CM

## 2019-08-08 DIAGNOSIS — G9340 Encephalopathy, unspecified: Secondary | ICD-10-CM | POA: Diagnosis not present

## 2019-08-08 DIAGNOSIS — I129 Hypertensive chronic kidney disease with stage 1 through stage 4 chronic kidney disease, or unspecified chronic kidney disease: Secondary | ICD-10-CM | POA: Diagnosis present

## 2019-08-08 DIAGNOSIS — R05 Cough: Secondary | ICD-10-CM | POA: Diagnosis not present

## 2019-08-08 DIAGNOSIS — Z20828 Contact with and (suspected) exposure to other viral communicable diseases: Secondary | ICD-10-CM | POA: Diagnosis not present

## 2019-08-08 DIAGNOSIS — G9341 Metabolic encephalopathy: Secondary | ICD-10-CM | POA: Diagnosis not present

## 2019-08-08 DIAGNOSIS — R310 Gross hematuria: Secondary | ICD-10-CM | POA: Diagnosis not present

## 2019-08-08 DIAGNOSIS — H9190 Unspecified hearing loss, unspecified ear: Secondary | ICD-10-CM | POA: Diagnosis present

## 2019-08-08 DIAGNOSIS — N1832 Chronic kidney disease, stage 3b: Secondary | ICD-10-CM | POA: Diagnosis present

## 2019-08-08 DIAGNOSIS — K222 Esophageal obstruction: Secondary | ICD-10-CM | POA: Diagnosis present

## 2019-08-08 DIAGNOSIS — N390 Urinary tract infection, site not specified: Secondary | ICD-10-CM

## 2019-08-08 DIAGNOSIS — Z79899 Other long term (current) drug therapy: Secondary | ICD-10-CM

## 2019-08-08 DIAGNOSIS — I824Y2 Acute embolism and thrombosis of unspecified deep veins of left proximal lower extremity: Secondary | ICD-10-CM | POA: Diagnosis not present

## 2019-08-08 DIAGNOSIS — F0390 Unspecified dementia without behavioral disturbance: Secondary | ICD-10-CM | POA: Diagnosis present

## 2019-08-08 DIAGNOSIS — I10 Essential (primary) hypertension: Secondary | ICD-10-CM | POA: Diagnosis not present

## 2019-08-08 DIAGNOSIS — E785 Hyperlipidemia, unspecified: Secondary | ICD-10-CM | POA: Diagnosis not present

## 2019-08-08 DIAGNOSIS — Z6826 Body mass index (BMI) 26.0-26.9, adult: Secondary | ICD-10-CM | POA: Diagnosis not present

## 2019-08-08 DIAGNOSIS — E440 Moderate protein-calorie malnutrition: Secondary | ICD-10-CM | POA: Diagnosis not present

## 2019-08-08 DIAGNOSIS — M7989 Other specified soft tissue disorders: Secondary | ICD-10-CM | POA: Diagnosis not present

## 2019-08-08 DIAGNOSIS — Z8673 Personal history of transient ischemic attack (TIA), and cerebral infarction without residual deficits: Secondary | ICD-10-CM

## 2019-08-08 DIAGNOSIS — R319 Hematuria, unspecified: Secondary | ICD-10-CM

## 2019-08-08 DIAGNOSIS — F17220 Nicotine dependence, chewing tobacco, uncomplicated: Secondary | ICD-10-CM | POA: Diagnosis present

## 2019-08-08 DIAGNOSIS — Z7982 Long term (current) use of aspirin: Secondary | ICD-10-CM | POA: Diagnosis not present

## 2019-08-08 DIAGNOSIS — R509 Fever, unspecified: Secondary | ICD-10-CM | POA: Diagnosis not present

## 2019-08-08 DIAGNOSIS — I82402 Acute embolism and thrombosis of unspecified deep veins of left lower extremity: Secondary | ICD-10-CM | POA: Diagnosis not present

## 2019-08-08 DIAGNOSIS — I82409 Acute embolism and thrombosis of unspecified deep veins of unspecified lower extremity: Secondary | ICD-10-CM | POA: Diagnosis present

## 2019-08-08 LAB — D-DIMER, QUANTITATIVE: D-Dimer, Quant: 1.25 ug/mL-FEU — ABNORMAL HIGH (ref 0.00–0.50)

## 2019-08-08 LAB — CBC WITH DIFFERENTIAL/PLATELET
Abs Immature Granulocytes: 0.02 10*3/uL (ref 0.00–0.07)
Basophils Absolute: 0.1 10*3/uL (ref 0.0–0.1)
Basophils Relative: 1 %
Eosinophils Absolute: 0.8 10*3/uL — ABNORMAL HIGH (ref 0.0–0.5)
Eosinophils Relative: 8 %
HCT: 41.1 % (ref 39.0–52.0)
Hemoglobin: 12.3 g/dL — ABNORMAL LOW (ref 13.0–17.0)
Immature Granulocytes: 0 %
Lymphocytes Relative: 29 %
Lymphs Abs: 2.9 10*3/uL (ref 0.7–4.0)
MCH: 28.9 pg (ref 26.0–34.0)
MCHC: 29.9 g/dL — ABNORMAL LOW (ref 30.0–36.0)
MCV: 96.5 fL (ref 80.0–100.0)
Monocytes Absolute: 0.5 10*3/uL (ref 0.1–1.0)
Monocytes Relative: 5 %
Neutro Abs: 5.6 10*3/uL (ref 1.7–7.7)
Neutrophils Relative %: 57 %
Platelets: 326 10*3/uL (ref 150–400)
RBC: 4.26 MIL/uL (ref 4.22–5.81)
RDW: 14.6 % (ref 11.5–15.5)
WBC: 10 10*3/uL (ref 4.0–10.5)
nRBC: 0 % (ref 0.0–0.2)

## 2019-08-08 LAB — URINALYSIS, ROUTINE W REFLEX MICROSCOPIC
Bilirubin Urine: NEGATIVE
Glucose, UA: NEGATIVE mg/dL
Ketones, ur: NEGATIVE mg/dL
Nitrite: NEGATIVE
Protein, ur: 100 mg/dL — AB
RBC / HPF: >50 RBC/hpf — ABNORMAL HIGH (ref 0–5)
Specific Gravity, Urine: 1.009 (ref 1.005–1.030)
WBC, UA: >50 WBC/hpf — ABNORMAL HIGH (ref 0–5)
pH: 5 (ref 5.0–8.0)

## 2019-08-08 LAB — LACTIC ACID, PLASMA
Lactic Acid, Venous: 1.1 mmol/L (ref 0.5–1.9)
Lactic Acid, Venous: 1.5 mmol/L (ref 0.5–1.9)

## 2019-08-08 LAB — LACTATE DEHYDROGENASE: LDH: 161 U/L (ref 98–192)

## 2019-08-08 LAB — COMPREHENSIVE METABOLIC PANEL
ALT: 12 U/L (ref 0–44)
AST: 17 U/L (ref 15–41)
Albumin: 3.1 g/dL — ABNORMAL LOW (ref 3.5–5.0)
Alkaline Phosphatase: 73 U/L (ref 38–126)
Anion gap: 11 (ref 5–15)
BUN: 22 mg/dL (ref 8–23)
CO2: 24 mmol/L (ref 22–32)
Calcium: 8.7 mg/dL — ABNORMAL LOW (ref 8.9–10.3)
Chloride: 104 mmol/L (ref 98–111)
Creatinine, Ser: 1.61 mg/dL — ABNORMAL HIGH (ref 0.61–1.24)
GFR calc Af Amer: 44 mL/min — ABNORMAL LOW (ref >60)
GFR calc non Af Amer: 38 mL/min — ABNORMAL LOW (ref >60)
Glucose, Bld: 86 mg/dL (ref 70–99)
Potassium: 4 mmol/L (ref 3.5–5.1)
Sodium: 139 mmol/L (ref 135–145)
Total Bilirubin: 0.5 mg/dL (ref 0.3–1.2)
Total Protein: 6.1 g/dL — ABNORMAL LOW (ref 6.5–8.1)

## 2019-08-08 LAB — PROCALCITONIN: Procalcitonin: <0.1 ng/mL

## 2019-08-08 LAB — FERRITIN: Ferritin: 40 ng/mL (ref 24–336)

## 2019-08-08 LAB — C-REACTIVE PROTEIN: CRP: 5.7 mg/dL — ABNORMAL HIGH (ref <1.0)

## 2019-08-08 LAB — POC SARS CORONAVIRUS 2 AG -  ED: SARS Coronavirus 2 Ag: NEGATIVE

## 2019-08-08 LAB — FIBRINOGEN: Fibrinogen: 545 mg/dL — ABNORMAL HIGH (ref 210–475)

## 2019-08-08 LAB — TRIGLYCERIDES: Triglycerides: 87 mg/dL (ref <150)

## 2019-08-08 MED ORDER — VITAMIN B-12 1000 MCG PO TABS
5000.0000 ug | ORAL_TABLET | Freq: Every day | ORAL | Status: DC
  Administered 2019-08-10 – 2019-08-15 (×6): 5000 ug via ORAL
  Filled 2019-08-08 (×7): qty 5

## 2019-08-08 MED ORDER — ASPIRIN 81 MG PO CHEW
325.0000 mg | CHEWABLE_TABLET | Freq: Every day | ORAL | Status: DC
  Administered 2019-08-08: 325 mg via ORAL
  Filled 2019-08-08: qty 5

## 2019-08-08 MED ORDER — VITAMIN D3 25 MCG (1000 UNIT) PO TABS
5000.0000 [IU] | ORAL_TABLET | Freq: Every day | ORAL | Status: DC
  Administered 2019-08-10 – 2019-08-15 (×6): 5000 [IU] via ORAL
  Filled 2019-08-08 (×7): qty 5

## 2019-08-08 MED ORDER — SIMVASTATIN 20 MG PO TABS
10.0000 mg | ORAL_TABLET | Freq: Every day | ORAL | Status: DC
  Administered 2019-08-08 – 2019-08-14 (×7): 10 mg via ORAL
  Filled 2019-08-08 (×8): qty 1

## 2019-08-08 MED ORDER — ACETAMINOPHEN 650 MG RE SUPP: 650.0000 mg | Freq: Four times a day (QID) | RECTAL | Status: DC | PRN

## 2019-08-08 MED ORDER — GARLIC 2000 MG PO TBEC: 2000.0000 mg | DELAYED_RELEASE_TABLET | Freq: Every day | ORAL | Status: DC

## 2019-08-08 MED ORDER — SODIUM CHLORIDE 0.9 % IV SOLN
1.0000 g | Freq: Once | INTRAVENOUS | Status: AC
  Administered 2019-08-08: 21:00:00 1 g via INTRAVENOUS
  Filled 2019-08-08: qty 10

## 2019-08-08 MED ORDER — ADULT MULTIVITAMIN W/MINERALS CH
1.0000 | ORAL_TABLET | Freq: Every day | ORAL | Status: DC
  Administered 2019-08-10 – 2019-08-15 (×6): 1 via ORAL
  Filled 2019-08-08 (×6): qty 1

## 2019-08-08 MED ORDER — GABAPENTIN 300 MG PO CAPS
300.0000 mg | ORAL_CAPSULE | Freq: Every day | ORAL | Status: DC | PRN
  Administered 2019-08-08 – 2019-08-12 (×3): 300 mg via ORAL
  Filled 2019-08-08 (×3): qty 1

## 2019-08-08 MED ORDER — ACETAMINOPHEN 325 MG PO TABS
650.0000 mg | ORAL_TABLET | Freq: Four times a day (QID) | ORAL | Status: DC | PRN
  Administered 2019-08-12: 20:00:00 650 mg via ORAL
  Filled 2019-08-08 (×2): qty 2

## 2019-08-08 MED ORDER — SODIUM CHLORIDE 0.9 % IV SOLN
1000.0000 mL | INTRAVENOUS | Status: DC
  Administered 2019-08-08: 17:00:00 1000 mL via INTRAVENOUS

## 2019-08-08 MED ORDER — COQ10 100 MG PO CAPS: ORAL_CAPSULE | Freq: Every day | ORAL | Status: DC

## 2019-08-08 MED ORDER — ENOXAPARIN SODIUM 40 MG/0.4ML ~~LOC~~ SOLN
40.0000 mg | SUBCUTANEOUS | Status: DC
  Administered 2019-08-08: 21:00:00 40 mg via SUBCUTANEOUS
  Filled 2019-08-08: qty 0.4

## 2019-08-08 MED ORDER — SODIUM CHLORIDE 0.9 % IV SOLN
1.0000 g | INTRAVENOUS | Status: DC
  Administered 2019-08-09: 20:00:00 1 g via INTRAVENOUS
  Filled 2019-08-08: qty 10
  Filled 2019-08-08: qty 1

## 2019-08-08 MED ORDER — SODIUM CHLORIDE 0.9 % IV SOLN
INTRAVENOUS | Status: DC
  Administered 2019-08-08: 21:00:00 75 mL/h via INTRAVENOUS

## 2019-08-08 MED ORDER — FINASTERIDE 5 MG PO TABS
5.0000 mg | ORAL_TABLET | Freq: Every day | ORAL | Status: DC
  Administered 2019-08-08 – 2019-08-14 (×7): 5 mg via ORAL
  Filled 2019-08-08 (×8): qty 1

## 2019-08-08 NOTE — ED Triage Notes (Signed)
Pt had procedure to stretch esophagus 3 weeks ago, reporting that he can't keep food down when swallowing.   EMS reports temp 100.0. Patient denies any other s/s. Wife died of COVID-19 08-23-23.    500 bolus NS  BP 148/90 P 70  RR 16 96% RA CBG 118

## 2019-08-08 NOTE — H&P (Signed)
TRH H&P    Patient Demographics:    Barry Swanson, is a 85 y.o. male  MRN: [id number]  DOB - 10/25/1928  Admit Date - 08/08/2019  Referring MD/NP/PA: Aletta Edouard [facility] Primary MD for the patient is [name], MD  Patient coming from:  home  Chief complaint- AMS   HPI:    Barry Swanson  is a 85 y.o. male,  w hypertension, hyperlipidemia, h/o stroke, schatski ring s/p esophageal dilation, 07/22/2019 whose wife apparently died of covid-19 on 19-Aug-2023 presents with " can't keep food down, and temp 100.  Pt was apparently brought from his pcp office to ER for reflux of food as well as lower extremity swelling.    In ED T 98.8 P 69, R 16, Bp 164/88 pox 97% on RA Wt 80.5kg  Wbc 10.0, Hgb 12.3, Plt 326,  Na 139, K 4.0, Bun 22, Creatinine 1.61 Ast 17, Alt 12 D dimer 1.25 Procalcitonin <0.1 LDH 161 Ferritin 40 Tg 87 Fibrinogen 545 Crp 5.7   Urinalysis wbc >50, rbc >50  Rocephin 1gm iv x1 given in ED  Pt will be admitted for AMS secondary to UTI. R/o Covid-19              Review of systems:    In addition to the HPI above,  No Fever-chills, No Headache, No changes with Vision or hearing,   No Chest pain, Cough or Shortness of Breath, No Abdominal pain, No Nausea or Vomiting, bowel movements are regular, No Blood in stool or Urine, No dysuria, No new skin rashes or bruises, No new joints pains-aches,  No new weakness, tingling, numbness in any extremity, No recent weight gain or loss, No polyuria, polydypsia or polyphagia, No significant Mental Stressors.  All other systems reviewed and are negative.    Past History of the following :    Past Medical History:  Diagnosis Date  . Hepatitis B   . HOH (hard of hearing)   . Hyperlipidemia   . Hypertension   . Stroke ([place])   . UTI (urinary tract infection)       Past Surgical History:  Procedure Laterality Date  .  BALLOON DILATION N/A 2019-07-22   Procedure: BALLOON DILATION;  Surgeon: Wonda Horner, MD;  Location: [facility];  Service: Endoscopy;  Laterality: N/A;  . BLADDER STONE REMOVAL     blasted  . CATARACT EXTRACTION W/ INTRAOCULAR LENS  IMPLANT, BILATERAL  2004  . CYSTOSCOPY WITH LITHOLAPAXY N/A 06/01/2019   Procedure: CYSTOSCOPY WITH LITHOLAPAXY;  Surgeon: Ardis Hughs, MD;  Location: [facility];  Service: Urology;  Laterality: N/A;  . ESOPHAGOGASTRODUODENOSCOPY (EGD) WITH PROPOFOL N/A 07/22/2019   Procedure: ESOPHAGOGASTRODUODENOSCOPY (EGD) WITH PROPOFOL WITH POSSIBLE DIL;  Surgeon: Wonda Horner, MD;  Location: [facility];  Service: Endoscopy;  Laterality: N/A;  . PROSTATE SURGERY     trimmed prostate away from urethral tract      Social History:      Social History   Tobacco Use  . Smoking status: Former Smoker  Packs/day: 2.00    Types: Cigarettes    Quit date: 08/17/1998    Years since quitting: 20.9  . Smokeless tobacco: Current User    Types: Chew  Substance Use Topics  . Alcohol use: Not Currently    Comment: not in 15 years       Family History :     Family History  Problem Relation Age of Onset  . Heart disease Mother   . Heart disease Father   . Parkinson's disease Sister   . Lung cancer Brother   . Leukemia Brother        Home Medications:   Prior to Admission medications   Medication Sig Start Date End Date Taking? Authorizing Provider  aspirin 81 MG chewable tablet Chew 4 tablets (324 mg total) by mouth daily. Patient taking differently: Chew 81 mg by mouth daily.  02/28/15  Yes Plunkett, Loree Fee, MD  Cholecalciferol (VITAMIN D-3) 125 MCG (5000 UT) TABS Take 5,000 mg by mouth daily.   Yes [provider]  Coenzyme Q10 (COQ10 PO) Take 1 tablet by mouth at bedtime.    Yes [provider]  Cyanocobalamin (VITAMIN B-12) 5000 MCG TBDP Take 5,000 mcg by mouth daily.   Yes [provider]  finasteride (PROSCAR) 5 MG  tablet Take 5 mg by mouth at bedtime.  01/30/15  Yes [provider]  gabapentin (NEURONTIN) 300 MG capsule Take 300 mg by mouth daily as needed (pain).  01/02/19  Yes [provider]  Garlic 1751 MG TBEC Take 2,000 mg by mouth daily.    Yes [provider]  Multiple Vitamins-Minerals (CENTRUM SILVER) tablet Take 1 tablet by mouth daily. 09/13/12  Yes [provider]  simvastatin (ZOCOR) 10 MG tablet Take 10 mg by mouth at bedtime.    Yes [provider]     Allergies:    No Known Allergies   Physical Exam:   Vitals  Blood pressure (!) 141/79, pulse 68, temperature 98.8 F (37.1 C), temperature source Oral, resp. rate 18, height _0  (1.727 m), weight 80.5 kg, SpO2 97 %.  1.  General: Axoxo2(person, hospital but not year)  2. Psychiatric: euthymic  3. Neurologic: cn2-12 intact, reflexes 2+ symmetric, diffuse with no clonus, motor 5/5 in all 4 ext  4. HEENMT:  Anicteric, pupils 1.67m symmetric, direct, consensual, intact Neck: no jvd  5. Respiratory : CTAB  6. Cardiovascular : rrr s1, s2, no m/g/r  7. Gastrointestinal:  Abd: soft, nt, nd, +bs No cva tenderness  8. Skin:  Ext: no c/c/ trace edema, slight redness of the left distal lower ext   9.Musculoskeletal:  Good ROM    Data Review:    CBC Recent Labs  Lab 08/08/19 1632  WBC 10.0  HGB 12.3*  HCT 41.1  PLT 326  MCV 96.5  MCH 28.9  MCHC 29.9*  RDW 14.6  LYMPHSABS 2.9  MONOABS 0.5  EOSABS 0.8*  BASOSABS 0.1   ------------------------------------------------------------------------------------------------------------------  Results for orders placed or performed during the hospital encounter of 08/08/19 (from the past 48 hour(s))  Lactic acid, plasma     Status: None   Collection Time: 08/08/19  4:32 PM  Result Value Ref Range   Lactic Acid, Venous 1.1 0.5 - 1.9 mmol/L    Comment: [facility]formed at [facility] [address]  [place] [id number] CBC WITH DIFFERENTIAL     Status: Abnormal   Collection Time: 08/08/19  4:32 PM  Result Value Ref Range  WBC 10.0 4.0 - 10.5 K/uL   RBC 4.26 4.22 - 5.81 MIL/uL   Hemoglobin 12.3 (L) 13.0 - 17.0 g/dL   HCT 41.1 39.0 - 52.0 %   MCV 96.5 80.0 - 100.0 fL   MCH 28.9 26.0 - 34.0 pg   MCHC 29.9 (L) 30.0 - 36.0 g/dL   RDW 14.6 11.5 - 15.5 %   Platelets 326 150 - 400 K/uL   nRBC 0.0 0.0 - 0.2 %   Neutrophils Relative % 57 %   Neutro Abs 5.6 1.7 - 7.7 K/uL   Lymphocytes Relative 29 %   Lymphs Abs 2.9 0.7 - 4.0 K/uL   Monocytes Relative 5 %   Monocytes Absolute 0.5 0.1 - 1.0 K/uL   Eosinophils Relative 8 %   Eosinophils Absolute 0.8 (H) 0.0 - 0.5 K/uL   Basophils Relative 1 %   Basophils Absolute 0.1 0.0 - 0.1 K/uL   Immature Granulocytes 0 %   Abs Immature Granulocytes 0.02 0.00 - 0.07 K/uL    Comment: Performed at [facility], [place] [address], [place], [place] [address]  Comprehensive metabolic panel     Status: Abnormal   Collection Time: 08/08/19  4:32 PM  Result Value Ref Range   Sodium 139 135 - 145 mmol/L   Potassium 4.0 3.5 - 5.1 mmol/L   Chloride 104 98 - 111 mmol/L   CO2 24 22 - 32 mmol/L   Glucose, Bld 86 70 - 99 mg/dL   BUN 22 8 - 23 mg/dL   Creatinine, Ser 1.61 (H) 0.61 - 1.24 mg/dL   Calcium 8.7 (L) 8.9 - 10.3 mg/dL   Total Protein 6.1 (L) 6.5 - 8.1 g/dL   Albumin 3.1 (L) 3.5 - 5.0 g/dL   AST 17 15 - 41 U/L   ALT 12 0 - 44 U/L   Alkaline Phosphatase 73 38 - 126 U/L   Total Bilirubin 0.5 0.3 - 1.2 mg/dL   GFR calc non Af Amer 38 (L) >60 mL/min   GFR calc Af Amer 44 (L) >60 mL/min   Anion gap 11 5 - 15    Comment: Performed at [facility], [place] [address] [place], [place] [address]  D-dimer, quantitative     Status: Abnormal   Collection Time: 08/08/19  4:32 PM  Result Value Ref Range   D-Dimer, Quant 1.25 (H) 0.00 - 0.50 ug/mL-FEU    Comment: (NOTE) At the manufacturer cut-off of 0.50 ug/mL FEU, this  assay has been documented to exclude PE with a sensitivity and negative predictive value of 97 to 99%.  At this time, this assay has not been approved by the FDA to exclude DVT/VTE. Results should be correlated with clinical presentation. Performed at [facility], [place] [address] [place], [place] [address]   Procalcitonin     Status: None   Collection Time: 08/08/19  4:32 PM  Result Value Ref Range   Procalcitonin <0.10 ng/mL    Comment:        Interpretation: PCT (Procalcitonin) <= 0.5 ng/mL: Systemic infection (sepsis) is not likely. Local bacterial infection is possible. (NOTE)       Sepsis PCT Algorithm           Lower Respiratory Tract                                      Infection PCT Algorithm    ----------------------------     ----------------------------  PCT < 0.25 ng/mL                PCT < 0.10 ng/mL         Strongly encourage             Strongly discourage   discontinuation of antibiotics    initiation of antibiotics    ----------------------------     -----------------------------       PCT 0.25 - 0.50 ng/mL            PCT 0.10 - 0.25 ng/mL               OR       >80% decrease in PCT            Discourage initiation of                                            antibiotics      Encourage discontinuation           of antibiotics    ----------------------------     -----------------------------         PCT >= 0.50 ng/mL              PCT 0.26 - 0.50 ng/mL               AND        <80% decrease in PCT             Encourage initiation of                                             antibiotics       Encourage continuation           of antibiotics    ----------------------------     -----------------------------        PCT >= 0.50 ng/mL                  PCT > 0.50 ng/mL               AND         increase in PCT                  Strongly encourage                                      initiation of antibiotics    Strongly encourage  escalation           of antibiotics                                     -----------------------------                                           PCT <= 0.25 ng/mL  OR                                        > 80% decrease in PCT                                     Discontinue / Do not initiate                                             antibiotics Performed at [place] [address] [place], [place] [address]   Lactate dehydrogenase     Status: None   Collection Time: 08/08/19  4:32 PM  Result Value Ref Range   LDH 161 98 - 192 U/L    Comment: Performed at [facility], [place] [address], [place], [place] [address]  Ferritin     Status: None   Collection Time: 08/08/19  4:32 PM  Result Value Ref Range   Ferritin 40 24 - 336 ng/mL    Comment: Performed at [facility], [place] [address], [place], [place] [address]  Triglycerides     Status: None   Collection Time: 08/08/19  4:32 PM  Result Value Ref Range   Triglycerides 87 <150 mg/dL    Comment: Performed at [facility], [place] [address] [place], [place] [address]  Fibrinogen     Status: Abnormal   Collection Time: 08/08/19  4:32 PM  Result Value Ref Range   Fibrinogen 545 (H) 210 - 475 mg/dL    Comment: Performed at [facility], [place] [address], [place], [place] [address]  C-reactive protein     Status: Abnormal   Collection Time: 08/08/19  4:32 PM  Result Value Ref Range   CRP 5.7 (H) <1.0 mg/dL    Comment: Performed at [facility], [place] [address], [place], [place] [address]  Urinalysis, Routine w reflex microscopic     Status: Abnormal   Collection Time: 08/08/19  5:39 PM  Result Value Ref Range   Color, Urine YELLOW YELLOW   APPearance TURBID (A) CLEAR   Specific Gravity, Urine 1.009 1.005 - 1.030   pH 5.0 5.0 - 8.0   Glucose, UA NEGATIVE NEGATIVE mg/dL   Hgb  urine dipstick LARGE (A) NEGATIVE   Bilirubin Urine NEGATIVE NEGATIVE   Ketones, ur NEGATIVE NEGATIVE mg/dL   Protein, ur 100 (A) NEGATIVE mg/dL   Nitrite NEGATIVE NEGATIVE   Leukocytes,Ua LARGE (A) NEGATIVE   RBC / HPF >50 (H) 0 - 5 RBC/hpf   WBC, UA >50 (H) 0 - 5 WBC/hpf   Bacteria, UA RARE (A) NONE SEEN   Squamous Epithelial / LPF 0-5 0 - 5   WBC Clumps PRESENT     Comment: Performed at [facility], [place] [address] [place], [place] [address]  POC SARS Coronavirus 2 Ag-ED - Nasal Swab (BD Veritor Kit)     Status: None   Collection Time: 08/08/19  5:43 PM  Result Value Ref Range   SARS Coronavirus 2 Ag NEGATIVE NEGATIVE    Comment: (NOTE) SARS-CoV-2 antigen NOT DETECTED.  Negative results are presumptive.  Negative results do not preclude SARS-CoV-2 infection and  should not be used as the sole basis for treatment or other patient management decisions, including infection  control decisions, particularly in the presence of clinical signs and  symptoms consistent with COVID-19, or in those who have been in contact with the virus.  Negative results must be combined with clinical observations, patient history, and epidemiological information. The expected result is Negative. Fact Sheet for Patients: PodPark.tn Fact Sheet for Healthcare Providers: GiftContent.is This test is not yet approved or cleared by the [place] FDA and  has been authorized for detection and/or diagnosis of SARS-CoV-2 by FDA under an Emergency Use Authorization (EUA).  This EUA will remain in effect (meaning this test can be used) for the duration of  the COVID-19 de claration under Section 564(b)(1) of the Act, 21 U.S.C. section 360bbb-3(b)(1), unless the authorization is terminated or revoked sooner.     Chemistries  Recent Labs  Lab 08/08/19 1632  NA 139  K 4.0  CL 104  CO2 24  GLUCOSE 86  BUN 22  CREATININE 1.61*    CALCIUM 8.7*  AST 17  ALT 12  ALKPHOS 73  BILITOT 0.5   ------------------------------------------------------------------------------------------------------------------  ------------------------------------------------------------------------------------------------------------------ GFR: Estimated Creatinine Clearance: 30.7 mL/min (A) (by C-G formula based on SCr of 1.61 mg/dL (H)). Liver Function Tests: Recent Labs  Lab 08/08/19 1632  AST 17  ALT 12  ALKPHOS 73  BILITOT 0.5  PROT 6.1*  ALBUMIN 3.1*   No results for input(s): LIPASE, AMYLASE in the last 168 hours. No results for input(s): AMMONIA in the last 168 hours. Coagulation Profile: No results for input(s): INR, PROTIME in the last 168 hours. Cardiac Enzymes: No results for input(s): CKTOTAL, CKMB, CKMBINDEX, TROPONINI in the last 168 hours. BNP (last 3 results) No results for input(s): PROBNP in the last 8760 hours. HbA1C: No results for input(s): HGBA1C in the last 72 hours. CBG: No results for input(s): GLUCAP in the last 168 hours. Lipid Profile: Recent Labs    08/08/19 1632  TRIG 87   Thyroid Function Tests: No results for input(s): TSH, T4TOTAL, FREET4, T3FREE, THYROIDAB in the last 72 hours. Anemia Panel: Recent Labs    08/08/19 1632  FERRITIN 40    --------------------------------------------------------------------------------------------------------------- Urine analysis:    Component Value Date/Time   COLORURINE YELLOW 08/08/2019 1739   APPEARANCEUR TURBID (A) 08/08/2019 1739   LABSPEC 1.009 08/08/2019 1739   PHURINE 5.0 08/08/2019 1739   GLUCOSEU NEGATIVE 08/08/2019 1739   HGBUR LARGE (A) 08/08/2019 1739   BILIRUBINUR NEGATIVE 08/08/2019 1739   KETONESUR NEGATIVE 08/08/2019 1739   PROTEINUR 100 (A) 08/08/2019 1739   UROBILINOGEN 1.0 02/28/2015 1550   NITRITE NEGATIVE 08/08/2019 1739   LEUKOCYTESUR LARGE (A) 08/08/2019 1739      Imaging Results:    DG Chest Port 1  View  Result Date: 08/08/2019 CLINICAL DATA:  Cough. EXAM: PORTABLE CHEST 1 VIEW COMPARISON:  04/30/2019 FINDINGS: Cardiomediastinal contours are stable within normal limits. Lungs are clear aside from a subtle opacity at the left lung base. No signs of pleural effusion or dense consolidation. Signs of mitral annular calcification. Visualized skeletal structures are unremarkable. IMPRESSION: Basilar atelectasis.  No acute cardiopulmonary disease. Electronically Signed   By: Zetta Bills M.D.   On: 08/08/2019 18:22   nsr at 77, lad, nl int, no st-t changes c/w ischemia   Assessment & Plan:    Active Problems:   AMS (altered mental status) AMS Secondary to acute lower uti Check b12, esr, rpr, tsh Monitor  Acute lower  UTI Urine culture Rocephin 1gm iv qday  Renal insufficiency Hydrate with  Ns iv Check cmp in am  H/o stroke, Hyperlipidemia Cont Simvastatin 49m po at bedtime Cont aspirin 852mpo qday  BPH Cont Proscar 41m52mo qday  Protein calorie malnutrition (moderate) Prostat 30 mL po bid   DVT Prophylaxis-   Lovenox - SCDs   AM Labs Ordered, also please review Full Orders  Family Communication: Admission, patients condition and plan of care including tests being ordered have been discussed with the patient  who indicate understanding and agree with the plan and Code Status.  Code Status:  FULL CODE   Admission status: Observation: Based on patients clinical presentation and evaluation of above clinical data, I have made determination that patient meets observation criteria at this time.    Time spent in minutes : 55 minutes   JamJani GravelD on 08/08/2019 at 8:38 PM

## 2019-08-08 NOTE — ED Provider Notes (Signed)
[facility] DEPT Provider Note   CSN: [id number] Arrival date & time: 08/08/19  1611     History Chief Complaint  Patient presents with  . Fever  . Emesis    Barry Swanson is a 85 y.o. male.  He is a very poor historian with poor fund of information.  He is brought in by EMS after being brought to his primary care doctor's office today.  The patient is complaining of swelling in his lower extremities that is been there for a while.  He also complains of frequent reflux of food when swallowing.  On review of prior notes he had an esophageal dilation last month but he cannot remember that and cannot tell me if things are better or worse since then.  He was noted to be febrile at the doctor's office.  Chronic cough not worse.  No abdominal pain, no diarrhea or urinary symptoms.  Reportedly his wife passed away from Covid a few weeks ago.  The history is provided by the patient and the EMS personnel.  Fever Max temp prior to arrival:  100 Onset quality:  Unable to specify Timing:  Unable to specify Progression:  Unchanged Chronicity:  New Relieved by:  None tried Worsened by:  Nothing Ineffective treatments:  None tried Associated symptoms: cough, nausea and vomiting   Associated symptoms: no chest pain, no chills, no diarrhea, no dysuria, no headaches, no rash, no rhinorrhea and no sore throat   Risk factors: sick contacts   Emesis Associated symptoms: cough and fever   Associated symptoms: no abdominal pain, no chills, no diarrhea, no headaches and no sore throat        Past Medical History:  Diagnosis Date  . Hepatitis B   . HOH (hard of hearing)   . Hyperlipidemia   . Hypertension   . Stroke ([place])   . UTI (urinary tract infection)     Patient Active Problem List   Diagnosis Date Noted  . Bladder stone 06/01/2019  . Sepsis ([place]) 04/30/2019  . Acute pyelonephritis 04/30/2019  . Acute kidney injury ([place]) 04/30/2019  . Confusion   .  Essential hypertension   . Acute encephalopathy 02/11/2015  . TIA (transient ischemic attack) 02/11/2015  . Hypertension   . Hyperlipidemia   . Calculus (=stone) 11/29/2012  . Malignant neoplasm of prostate ([place]) 11/29/2012  . Bladder outflow obstruction 11/29/2012    Past Surgical History:  Procedure Laterality Date  . BALLOON DILATION N/A 06/29/2019   Procedure: BALLOON DILATION;  Surgeon: Wonda Horner, MD;  Location: [facility];  Service: Endoscopy;  Laterality: N/A;  . BLADDER STONE REMOVAL     blasted  . CATARACT EXTRACTION W/ INTRAOCULAR LENS  IMPLANT, BILATERAL  2004  . CYSTOSCOPY WITH LITHOLAPAXY N/A 06/01/2019   Procedure: CYSTOSCOPY WITH LITHOLAPAXY;  Surgeon: Ardis Hughs, MD;  Location: [facility];  Service: Urology;  Laterality: N/A;  . ESOPHAGOGASTRODUODENOSCOPY (EGD) WITH PROPOFOL N/A 06/29/2019   Procedure: ESOPHAGOGASTRODUODENOSCOPY (EGD) WITH PROPOFOL WITH POSSIBLE DIL;  Surgeon: Wonda Horner, MD;  Location: [facility];  Service: Endoscopy;  Laterality: N/A;  . PROSTATE SURGERY     trimmed prostate away from urethral tract       Family History  Problem Relation Age of Onset  . Heart disease Mother   . Heart disease Father   . Parkinson's disease Sister   . Lung cancer Brother   . Leukemia Brother     Social History   Tobacco Use  .  Smoking status: Former Smoker    Packs/day: 2.00    Types: Cigarettes    Quit date: 08/17/1998    Years since quitting: 20.9  . Smokeless tobacco: Current User    Types: Chew  Substance Use Topics  . Alcohol use: Not Currently    Comment: not in 15 years  . Drug use: No    Home Medications Prior to Admission medications   Medication Sig Start Date End Date Taking? Authorizing Provider  aspirin 81 MG chewable tablet Chew 4 tablets (324 mg total) by mouth daily. Patient taking differently: Chew 325 mg by mouth daily.  02/28/15   Blanchie Dessert, MD  Cholecalciferol (VITAMIN D-3) 125 MCG (5000 UT) TABS Take  5,000 mg by mouth daily.    [provider]  Coenzyme Q10 (COQ10 PO) Take 1 tablet by mouth at bedtime.     [provider]  Cyanocobalamin (VITAMIN B-12) 5000 MCG TBDP Take 5,000 mcg by mouth daily.    [provider]  finasteride (PROSCAR) 5 MG tablet Take 5 mg by mouth at bedtime.  01/30/15   [provider]  gabapentin (NEURONTIN) 300 MG capsule Take 300 mg by mouth daily as needed (pain).  01/02/19   [provider]  Garlic 7741 MG TBEC Take 2,000 mg by mouth daily.     [provider]  Multiple Vitamins-Minerals (CENTRUM SILVER) tablet Take 1 tablet by mouth daily. 09/13/12   [provider]  simvastatin (ZOCOR) 10 MG tablet Take 10 mg by mouth at bedtime.     [provider]    Allergies    Patient has no known allergies.  Review of Systems   Review of Systems  Constitutional: Positive for fever. Negative for chills.  HENT: Negative for rhinorrhea and sore throat.   Eyes: Negative for visual disturbance.  Respiratory: Positive for cough. Negative for shortness of breath.   Cardiovascular: Negative for chest pain.  Gastrointestinal: Positive for nausea and vomiting. Negative for abdominal pain and diarrhea.  Genitourinary: Negative for dysuria.  Musculoskeletal: Positive for gait problem.  Skin: Negative for rash.  Neurological: Negative for headaches.    Physical Exam Updated Vital Signs BP (!) 164/88 (BP Location: Right Arm)   Pulse 69   Temp 98.8 F (37.1 C) (Oral)   Resp 16   Ht 5\' 8"  (1.727 m)   Wt 80.5 kg   SpO2 97%   BMI 26.98 kg/m   Physical Exam Vitals and nursing note reviewed.  Constitutional:      Appearance: He is well-developed.  HENT:     Head: Normocephalic and atraumatic.  Eyes:     Conjunctiva/sclera: Conjunctivae normal.  Cardiovascular:     Rate and Rhythm: Normal rate and regular rhythm.     Heart sounds: No murmur.  Pulmonary:     Effort: Pulmonary effort is normal. No  respiratory distress.     Breath sounds: Normal breath sounds.  Abdominal:     Palpations: Abdomen is soft.     Tenderness: There is no abdominal tenderness.  Musculoskeletal:        General: No tenderness.     Cervical back: Neck supple.     Right lower leg: Edema present.     Left lower leg: Edema present.     Comments: Is swelling of bilateral lower extremities left greater than right.  There is a little bit of erythema to the left one although no particular warmth.  Skin:    General: Skin is warm  and dry.     Capillary Refill: Capillary refill takes less than 2 seconds.  Neurological:     General: No focal deficit present.     Mental Status: He is alert. He is disoriented.     Comments: Patient is awake and alert.  He is not oriented to place or time.  He is moving all extremities nonfocally.     ED Results / Procedures / Treatments   Labs (all labs ordered are listed, but only abnormal results are displayed) Labs Reviewed  CBC WITH DIFFERENTIAL/PLATELET - Abnormal; Notable for the following components:      Result Value   Hemoglobin 12.3 (*)    MCHC 29.9 (*)    Eosinophils Absolute 0.8 (*)    All other components within normal limits  COMPREHENSIVE METABOLIC PANEL - Abnormal; Notable for the following components:   Creatinine, Ser 1.61 (*)    Calcium 8.7 (*)    Total Protein 6.1 (*)    Albumin 3.1 (*)    GFR calc non Af Amer 38 (*)    GFR calc Af Amer 44 (*)    All other components within normal limits  D-DIMER, QUANTITATIVE (NOT AT [facility]) - Abnormal; Notable for the following components:   D-Dimer, Quant 1.25 (*)    All other components within normal limits  FIBRINOGEN - Abnormal; Notable for the following components:   Fibrinogen 545 (*)    All other components within normal limits  C-REACTIVE PROTEIN - Abnormal; Notable for the following components:   CRP 5.7 (*)    All other components within normal limits  URINALYSIS, ROUTINE W REFLEX MICROSCOPIC - Abnormal;  Notable for the following components:   APPearance TURBID (*)    Hgb urine dipstick LARGE (*)    Protein, ur 100 (*)    Leukocytes,Ua LARGE (*)    RBC / HPF >50 (*)    WBC, UA >50 (*)    Bacteria, UA RARE (*)    All other components within normal limits  COMPREHENSIVE METABOLIC PANEL - Abnormal; Notable for the following components:   Glucose, Bld 100 (*)    Creatinine, Ser 1.62 (*)    Calcium 8.5 (*)    Total Protein 5.5 (*)    Albumin 2.7 (*)    AST 12 (*)    GFR calc non Af Amer 37 (*)    GFR calc Af Amer 43 (*)    All other components within normal limits  CBC - Abnormal; Notable for the following components:   Hemoglobin 12.3 (*)    MCHC 29.6 (*)    All other components within normal limits  VITAMIN B12 - Abnormal; Notable for the following components:   Vitamin B-12 1,495 (*)    All other components within normal limits  SEDIMENTATION RATE - Abnormal; Notable for the following components:   Sed Rate 23 (*)    All other components within normal limits  CULTURE, BLOOD (ROUTINE X 2)  CULTURE, BLOOD (ROUTINE X 2)  SARS CORONAVIRUS 2 (TAT 6-24 HRS)  URINE CULTURE  LACTIC ACID, PLASMA  LACTIC ACID, PLASMA  PROCALCITONIN  LACTATE DEHYDROGENASE  FERRITIN  TRIGLYCERIDES  TSH  RPR  BRAIN NATRIURETIC PEPTIDE  POC SARS CORONAVIRUS 2 AG -  ED    EKG EKG Interpretation  Date/Time:  Tuesday August 08 2019 17:00:43 EST Ventricular Rate:  73 PR Interval:    QRS Duration: 106 QT Interval:  408 QTC Calculation: 450 R Axis:   -38 Text Interpretation: Age not  entered, assumed to be  85 years old for purpose of ECG interpretation Sinus rhythm Multiple premature complexes, vent & supraven Short PR interval Left axis deviation Low voltage, precordial leads similar to prior 9/20 Confirmed by Aletta Edouard [phone]) on 08/08/2019 5:20:27 PM   Radiology CT HEAD WO CONTRAST  Result Date: 08/08/2019 CLINICAL DATA:  Altered mental status EXAM: CT HEAD WITHOUT CONTRAST  TECHNIQUE: Contiguous axial images were obtained from the base of the skull through the vertex without intravenous contrast. COMPARISON:  February 11, 2015 FINDINGS: Brain: No evidence of acute territorial infarction, hemorrhage, hydrocephalus,extra-axial collection or mass lesion/mass effect. There is dilatation the ventricles and sulci consistent with age-related atrophy. Low-attenuation changes in the deep white matter consistent with small vessel ischemia. Vascular: No hyperdense vessel or unexpected calcification. Skull: The skull is intact. No fracture or focal lesion identified. Sinuses/Orbits: The visualized paranasal sinuses and mastoid air cells are clear. The orbits and globes intact. Other: None IMPRESSION: No acute intracranial abnormality. Findings consistent with age related atrophy and chronic small vessel ischemia Electronically Signed   By: Prudencio Pair M.D.   On: 08/08/2019 20:53   DG Chest Port 1 View  Result Date: 08/08/2019 CLINICAL DATA:  Cough. EXAM: PORTABLE CHEST 1 VIEW COMPARISON:  04/30/2019 FINDINGS: Cardiomediastinal contours are stable within normal limits. Lungs are clear aside from a subtle opacity at the left lung base. No signs of pleural effusion or dense consolidation. Signs of mitral annular calcification. Visualized skeletal structures are unremarkable. IMPRESSION: Basilar atelectasis.  No acute cardiopulmonary disease. Electronically Signed   By: Zetta Bills M.D.   On: 08/08/2019 18:22   VAS [place] LOWER EXTREMITY VENOUS (DVT) (ONLY [facility] & [facility] 7a-7p)  Result Date: 08/09/2019  Lower Venous Study Indications: Swelling.  Risk Factors: None identified. Limitations: Poor ultrasound/tissue interface. Comparison Study: No prior studies. Performing Technologist: Oliver Hum RVT  Examination Guidelines: A complete evaluation includes B-mode imaging, spectral Doppler, color Doppler, and power Doppler as needed of all accessible portions of each vessel. Bilateral testing is  considered an integral part of a complete examination. Limited examinations for reoccurring indications may be performed as noted.  +---------+---------------+---------+-----------+----------+--------------+ RIGHT    CompressibilityPhasicitySpontaneityPropertiesThrombus Aging +---------+---------------+---------+-----------+----------+--------------+ CFV      Full           Yes      Yes                                 +---------+---------------+---------+-----------+----------+--------------+ SFJ      Full                                                        +---------+---------------+---------+-----------+----------+--------------+ FV Prox  Full                                                        +---------+---------------+---------+-----------+----------+--------------+ FV Mid   Full                                                        +---------+---------------+---------+-----------+----------+--------------+  FV DistalFull                                                        +---------+---------------+---------+-----------+----------+--------------+ PFV      Full                                                        +---------+---------------+---------+-----------+----------+--------------+ POP      Full           Yes      Yes                                 +---------+---------------+---------+-----------+----------+--------------+ PTV      Full                                                        +---------+---------------+---------+-----------+----------+--------------+ PERO     Full                                                        +---------+---------------+---------+-----------+----------+--------------+   +---------+---------------+---------+-----------+----------+--------------+ LEFT     CompressibilityPhasicitySpontaneityPropertiesThrombus Aging  +---------+---------------+---------+-----------+----------+--------------+ CFV      Full           Yes      Yes                                 +---------+---------------+---------+-----------+----------+--------------+ SFJ      Full                                                        +---------+---------------+---------+-----------+----------+--------------+ FV Prox  Full                                                        +---------+---------------+---------+-----------+----------+--------------+ FV Mid   Full                                                        +---------+---------------+---------+-----------+----------+--------------+ FV DistalFull                                                        +---------+---------------+---------+-----------+----------+--------------+  PFV      Full                                                        +---------+---------------+---------+-----------+----------+--------------+ POP      Full           Yes      Yes                                 +---------+---------------+---------+-----------+----------+--------------+ PTV      Full                                                        +---------+---------------+---------+-----------+----------+--------------+ PERO     Full                                                        +---------+---------------+---------+-----------+----------+--------------+ Gastroc  None                                         Acute          +---------+---------------+---------+-----------+----------+--------------+     Summary: Right: There is no evidence of deep vein thrombosis in the lower extremity. However, portions of this examination were limited- see technologist comments above. No cystic structure found in the popliteal fossa. Left: Findings consistent with acute deep vein thrombosis involving the left gastrocnemius veins. No cystic structure found in  the popliteal fossa.  *See table(s) above for measurements and observations.    Preliminary     Procedures Procedures (including critical care time)  Medications Ordered in ED Medications  acetaminophen (TYLENOL) tablet 650 mg (has no administration in time range)    Or  acetaminophen (TYLENOL) suppository 650 mg (has no administration in time range)  cefTRIAXone (ROCEPHIN) 1 g in sodium chloride 0.9 % 100 mL IVPB (has no administration in time range)  simvastatin (ZOCOR) tablet 10 mg (10 mg Oral Given 08/08/19 2103)  finasteride (PROSCAR) tablet 5 mg (5 mg Oral Given 08/08/19 2103)  vitamin B-12 (CYANOCOBALAMIN) tablet 5,000 mcg (has no administration in time range)  gabapentin (NEURONTIN) capsule 300 mg (300 mg Oral Given 08/08/19 2102)  cholecalciferol (VITAMIN D) tablet 5,000 Units (has no administration in time range)  multivitamin with minerals tablet 1 tablet (has no administration in time range)  feeding supplement (PRO-STAT SUGAR FREE 64) liquid 30 mL (30 mLs Oral Given 08/09/19 0327)  traMADol (ULTRAM) tablet 50 mg (50 mg Oral Given 08/09/19 0434)  cefTRIAXone (ROCEPHIN) 1 g in sodium chloride 0.9 % 100 mL IVPB (0 g Intravenous Stopped 08/08/19 2144)    ED Course  I have reviewed the triage vital signs and the nursing notes.  Pertinent labs & imaging results that were available during my care of the patient were reviewed by me and considered in my medical decision making (see chart  for details).  Clinical Course as of Aug 08 1212  Tue Aug 08, 3579  6240 85 year old male here with fever, close contact with Covid, frequent refluxing and regurgitation of food, lower extremity edema.  Differential includes cellulitis, Covid, dehydration, pneumonia, metabolic derangement.    [MB]  9629 Vascular tech is asking if we can wait until the Covid test is resulted before he gets his study.  I think this is reasonable.   [MB]  1932 Discussed with Dr. Maudie Mercury from [facility] hospitalist service  who will evaluate the patient for admission.  His urinalysis came back with greater than 50 reds 50 whites.  Have ordered him ceftriaxone.  Am still concerned he could be Covid positive also.  Renal function elevated but at baseline.   [MB]    Clinical Course User Index [MB] Hayden Rasmussen, MD   MDM Rules/Calculators/A&P                     Barry Swanson was evaluated in Emergency Department on 08/08/2019 for the symptoms described in the history of present illness. He was evaluated in the context of the global COVID-19 pandemic, which necessitated consideration that the patient might be at risk for infection with the SARS-CoV-2 virus that causes COVID-19. Institutional protocols and algorithms that pertain to the evaluation of patients at risk for COVID-19 are in a state of rapid change based on information released by regulatory bodies including the CDC and federal and state organizations. These policies and algorithms were followed during the patient's care in the ED.  Final Clinical Impression(s) / ED Diagnoses Final diagnoses:  Altered mental status, unspecified altered mental status type  Lower urinary tract infection, acute  Dysphagia, unspecified type    Rx / DC Orders ED Discharge Orders    None       Hayden Rasmussen, MD 08/09/19 1216

## 2019-08-08 NOTE — Progress Notes (Signed)
PHARMACIST - PHYSICIAN ORDER COMMUNICATION  CONCERNING: P&T Medication Policy on Herbal Medications  DESCRIPTION:  This patient's order for:  Coenzyme Q and Garlic  has been noted.  This product(s) is classified as an "herbal" or natural product. Due to a lack of definitive safety studies or FDA approval, nonstandard manufacturing practices, plus the potential risk of unknown drug-drug interactions while on inpatient medications, the Pharmacy and Therapeutics Committee does not permit the use of "herbal" or natural products of this type within [facility].   ACTION TAKEN: The pharmacy department is unable to verify this order at this time and your patient has been informed of this safety policy. Please reevaluate patient's clinical condition at discharge and address if the herbal or natural product(s) should be resumed at that time.  Leone Haven, PharmD

## 2019-08-09 ENCOUNTER — Inpatient Hospital Stay (HOSPITAL_COMMUNITY): Payer: Medicare Other

## 2019-08-09 ENCOUNTER — Observation Stay (HOSPITAL_COMMUNITY): Payer: Medicare Other

## 2019-08-09 ENCOUNTER — Encounter (HOSPITAL_COMMUNITY): Payer: Self-pay | Admitting: Internal Medicine

## 2019-08-09 DIAGNOSIS — Z20828 Contact with and (suspected) exposure to other viral communicable diseases: Secondary | ICD-10-CM | POA: Diagnosis present

## 2019-08-09 DIAGNOSIS — K222 Esophageal obstruction: Secondary | ICD-10-CM

## 2019-08-09 DIAGNOSIS — I824Y2 Acute embolism and thrombosis of unspecified deep veins of left proximal lower extremity: Secondary | ICD-10-CM | POA: Diagnosis not present

## 2019-08-09 DIAGNOSIS — N1832 Chronic kidney disease, stage 3b: Secondary | ICD-10-CM | POA: Diagnosis present

## 2019-08-09 DIAGNOSIS — I129 Hypertensive chronic kidney disease with stage 1 through stage 4 chronic kidney disease, or unspecified chronic kidney disease: Secondary | ICD-10-CM | POA: Diagnosis present

## 2019-08-09 DIAGNOSIS — M7989 Other specified soft tissue disorders: Secondary | ICD-10-CM

## 2019-08-09 DIAGNOSIS — Z8673 Personal history of transient ischemic attack (TIA), and cerebral infarction without residual deficits: Secondary | ICD-10-CM | POA: Diagnosis not present

## 2019-08-09 DIAGNOSIS — G9341 Metabolic encephalopathy: Secondary | ICD-10-CM | POA: Diagnosis present

## 2019-08-09 DIAGNOSIS — H9190 Unspecified hearing loss, unspecified ear: Secondary | ICD-10-CM | POA: Diagnosis present

## 2019-08-09 DIAGNOSIS — F17220 Nicotine dependence, chewing tobacco, uncomplicated: Secondary | ICD-10-CM | POA: Diagnosis present

## 2019-08-09 DIAGNOSIS — E440 Moderate protein-calorie malnutrition: Secondary | ICD-10-CM | POA: Diagnosis present

## 2019-08-09 DIAGNOSIS — I82462 Acute embolism and thrombosis of left calf muscular vein: Secondary | ICD-10-CM | POA: Diagnosis present

## 2019-08-09 DIAGNOSIS — R509 Fever, unspecified: Secondary | ICD-10-CM

## 2019-08-09 DIAGNOSIS — Z7982 Long term (current) use of aspirin: Secondary | ICD-10-CM | POA: Diagnosis not present

## 2019-08-09 DIAGNOSIS — R310 Gross hematuria: Secondary | ICD-10-CM | POA: Diagnosis not present

## 2019-08-09 DIAGNOSIS — N400 Enlarged prostate without lower urinary tract symptoms: Secondary | ICD-10-CM | POA: Diagnosis present

## 2019-08-09 DIAGNOSIS — G9340 Encephalopathy, unspecified: Secondary | ICD-10-CM | POA: Diagnosis not present

## 2019-08-09 DIAGNOSIS — E785 Hyperlipidemia, unspecified: Secondary | ICD-10-CM | POA: Diagnosis present

## 2019-08-09 DIAGNOSIS — F0390 Unspecified dementia without behavioral disturbance: Secondary | ICD-10-CM | POA: Diagnosis present

## 2019-08-09 DIAGNOSIS — Z6826 Body mass index (BMI) 26.0-26.9, adult: Secondary | ICD-10-CM | POA: Diagnosis not present

## 2019-08-09 DIAGNOSIS — Z79899 Other long term (current) drug therapy: Secondary | ICD-10-CM | POA: Diagnosis not present

## 2019-08-09 DIAGNOSIS — I82402 Acute embolism and thrombosis of unspecified deep veins of left lower extremity: Secondary | ICD-10-CM

## 2019-08-09 DIAGNOSIS — N390 Urinary tract infection, site not specified: Secondary | ICD-10-CM | POA: Diagnosis present

## 2019-08-09 DIAGNOSIS — R4182 Altered mental status, unspecified: Secondary | ICD-10-CM | POA: Diagnosis not present

## 2019-08-09 DIAGNOSIS — K219 Gastro-esophageal reflux disease without esophagitis: Secondary | ICD-10-CM | POA: Diagnosis present

## 2019-08-09 LAB — COMPREHENSIVE METABOLIC PANEL
ALT: 9 U/L (ref 0–44)
AST: 12 U/L — ABNORMAL LOW (ref 15–41)
Albumin: 2.7 g/dL — ABNORMAL LOW (ref 3.5–5.0)
Alkaline Phosphatase: 68 U/L (ref 38–126)
Anion gap: 7 (ref 5–15)
BUN: 20 mg/dL (ref 8–23)
CO2: 25 mmol/L (ref 22–32)
Calcium: 8.5 mg/dL — ABNORMAL LOW (ref 8.9–10.3)
Chloride: 109 mmol/L (ref 98–111)
Creatinine, Ser: 1.62 mg/dL — ABNORMAL HIGH (ref 0.61–1.24)
GFR calc Af Amer: 43 mL/min — ABNORMAL LOW (ref >60)
GFR calc non Af Amer: 37 mL/min — ABNORMAL LOW (ref >60)
Glucose, Bld: 100 mg/dL — ABNORMAL HIGH (ref 70–99)
Potassium: 4 mmol/L (ref 3.5–5.1)
Sodium: 141 mmol/L (ref 135–145)
Total Bilirubin: 0.5 mg/dL (ref 0.3–1.2)
Total Protein: 5.5 g/dL — ABNORMAL LOW (ref 6.5–8.1)

## 2019-08-09 LAB — RPR: RPR Ser Ql: NONREACTIVE

## 2019-08-09 LAB — CBC
HCT: 41.6 % (ref 39.0–52.0)
Hemoglobin: 12.3 g/dL — ABNORMAL LOW (ref 13.0–17.0)
MCH: 28.5 pg (ref 26.0–34.0)
MCHC: 29.6 g/dL — ABNORMAL LOW (ref 30.0–36.0)
MCV: 96.3 fL (ref 80.0–100.0)
Platelets: 312 10*3/uL (ref 150–400)
RBC: 4.32 MIL/uL (ref 4.22–5.81)
RDW: 14.6 % (ref 11.5–15.5)
WBC: 8.5 10*3/uL (ref 4.0–10.5)
nRBC: 0 % (ref 0.0–0.2)

## 2019-08-09 LAB — SARS CORONAVIRUS 2 (TAT 6-24 HRS): SARS Coronavirus 2: NEGATIVE

## 2019-08-09 LAB — TSH: TSH: 2.402 u[IU]/mL (ref 0.350–4.500)

## 2019-08-09 LAB — ECHOCARDIOGRAM COMPLETE
Height: 68 in
Weight: 2839.52 oz

## 2019-08-09 LAB — VITAMIN B12: Vitamin B-12: 1495 pg/mL — ABNORMAL HIGH (ref 180–914)

## 2019-08-09 LAB — BRAIN NATRIURETIC PEPTIDE: B Natriuretic Peptide: 197.5 pg/mL — ABNORMAL HIGH (ref 0.0–100.0)

## 2019-08-09 LAB — SEDIMENTATION RATE: Sed Rate: 23 mm/hr — ABNORMAL HIGH (ref 0–16)

## 2019-08-09 MED ORDER — TRAMADOL HCL 50 MG PO TABS
50.0000 mg | ORAL_TABLET | Freq: Four times a day (QID) | ORAL | Status: DC | PRN
  Administered 2019-08-09 – 2019-08-14 (×5): 50 mg via ORAL
  Filled 2019-08-09 (×5): qty 1

## 2019-08-09 MED ORDER — PRO-STAT SUGAR FREE PO LIQD
30.0000 mL | Freq: Two times a day (BID) | ORAL | Status: DC
  Administered 2019-08-09 – 2019-08-14 (×11): 30 mL via ORAL
  Filled 2019-08-09 (×13): qty 30

## 2019-08-09 MED ORDER — APIXABAN 5 MG PO TABS
10.0000 mg | ORAL_TABLET | Freq: Two times a day (BID) | ORAL | Status: DC
  Administered 2019-08-09 – 2019-08-12 (×7): 10 mg via ORAL
  Filled 2019-08-09 (×8): qty 2

## 2019-08-09 MED ORDER — APIXABAN 5 MG PO TABS: 5.0000 mg | ORAL_TABLET | Freq: Two times a day (BID) | ORAL | Status: DC

## 2019-08-09 MED ORDER — SODIUM CHLORIDE 0.9 % IV SOLN
INTRAVENOUS | Status: DC | PRN
  Administered 2019-08-09: 250 mL via INTRAVENOUS

## 2019-08-09 NOTE — Progress Notes (Signed)
Echocardiogram 2D Echocardiogram has been performed.  Oneal Deputy Valery Chance 08/09/2019, 2:25 PM

## 2019-08-09 NOTE — Progress Notes (Signed)
PT Cancellation Note  Patient Details Name: Barry Swanson MRN: [id number] DOB: 03/08/1929   Cancelled Treatment:    Reason Eval/Treat Not Completed: Medical issues which prohibited therapy;Patient not medically ready(Pt found to have Acute DVT. Pharmacy consulted and pt has not initaited anticoagulation therapy at this time. Will follow up at later date/time when medically ready.)   Kipp Brood, PT, DPT Physical Therapist with [facility]  08/09/2019 12:30 PM

## 2019-08-09 NOTE — ED Notes (Signed)
Pt advised he was not hungry at this time and to keep meal trey in room just in case he gets hungry.

## 2019-08-09 NOTE — Progress Notes (Signed)
PROGRESS NOTE  Barry Swanson NIO:[id number] DOB: 1929-06-24 DOA: 08/08/2019 PCP: Barry Infante, MD  HPI/Recap of past 24 hours: HPI from [name]  is a 85 y.o. male,  w hypertension, hyperlipidemia, h/o stroke, schatski ring s/p esophageal dilation on 07-18-2019 whose wife apparently died of covid-19 on Aug 15, 2023 presents with " can't keep food down, and temp 100.  Pt was apparently brought from his pcp office to ER for reflux of food as well as lower extremity swelling. Pt also noted to have some AMS. In the ED, VSS. UA positive for WBC>50, rbc>50. Pt admitted for AMS 2/2 to UTI.  Patient is a very poor historian.   Today, patient still reports some difficulty swallowing, noted to have some cough which is noted to be chronic.  Patient denies any chest pain, abdominal pain, nausea/vomiting, fever/chills.  Patient noted with possible mild cognitive deficits  Assessment/Plan: Active Problems:   AMS (altered mental status)  Acute metabolic encephalopathy Mentation improving, not at baseline, very poor historian (HOH) Unknown etiology, ??UTI, ??  Cognitive impairment TSH, B12, all within normal limits, RPR nonreactive CT head with no acute intracranial abnormalities Monitor closely  ??UTI Low-grade temp admission, no leukocytosis UA with WBC greater than 50, RBC greater than 50 UC pending Continue IV ceftriaxone for now  Dysphagia likely 2/2 Schatzki ring Status post esophageal dilatation on 18-Jul-2019, by Dr. Maryann Alar to have trouble swallowing, with chronic cough Chest x-ray with bibasilar atelectasis, no acute cardiopulmonary disease SLP to evaluate for aspiration May consult GI pending SLP evaluation  LLE acute DVT/bilateral lower extremity edema Doppler showed DVT noted in the left gastrocnemius veins Start apixaban, pharmacy consulted BNP pending Echo pending  CKD stage IIIb Creatinine stable at baseline Daily BMP  H/O CVA/hyperlipidemia Continue  simvastatin, hold ASA for now  BPH Continue Proscar        Malnutrition Type:      Malnutrition Characteristics:      Nutrition Interventions:       Estimated body mass index is 26.98 kg/m as calculated from the following:   Height as of this encounter: 5\' 8"  (1.727 m).   Weight as of this encounter: 80.5 kg.     Code Status: Full  Family Communication: None at bedside  Disposition Plan: To be determined   Consultants:  None  Procedures:  None  Antimicrobials:  Ceftriaxone  DVT prophylaxis: Eliquis   Objective: Vitals:   08/09/19 0900 08/09/19 0915 08/09/19 0930 08/09/19 1000  BP: (!) 148/77  (!) 143/80 (!) 146/76  Pulse: 65 64 66 67  Resp: 17 17 18 18   Temp:      TempSrc:      SpO2: 94% 94% 93% 93%  Weight:      Height:        Intake/Output Summary (Last 24 hours) at 08/09/2019 1058 Last data filed at 08/08/2019 2144 Gross per 24 hour  Intake 595.83 ml  Output --  Net 595.83 ml   Filed Weights   08/08/19 1631  Weight: 80.5 kg    Exam:  General: NAD, awake, alert, oriented to self  Cardiovascular: S1, S2 present  Respiratory: CTAB  Abdomen: Soft, nontender, nondistended, bowel sounds present  Musculoskeletal: 1+ bilateral pedal edema noted, with LLE erythema, calf tenderness  Skin:  LLE erythema  Psychiatry: Normal mood   Data Reviewed: CBC: Recent Labs  Lab 08/08/19 1632 08/09/19 0436  WBC 10.0 8.5  NEUTROABS 5.6  --   HGB 12.3* 12.3*  HCT 41.1  41.6  MCV 96.5 96.3  PLT 326 254   Basic Metabolic Panel: Recent Labs  Lab 08/08/19 1632 08/09/19 0436  NA 139 141  K 4.0 4.0  CL 104 109  CO2 24 25  GLUCOSE 86 100*  BUN 22 20  CREATININE 1.61* 1.62*  CALCIUM 8.7* 8.5*   GFR: Estimated Creatinine Clearance: 30.5 mL/min (A) (by C-G formula based on SCr of 1.62 mg/dL (H)). Liver Function Tests: Recent Labs  Lab 08/08/19 1632 08/09/19 0436  AST 17 12*  ALT 12 9  ALKPHOS 73 68  BILITOT 0.5 0.5   PROT 6.1* 5.5*  ALBUMIN 3.1* 2.7*   No results for input(s): LIPASE, AMYLASE in the last 168 hours. No results for input(s): AMMONIA in the last 168 hours. Coagulation Profile: No results for input(s): INR, PROTIME in the last 168 hours. Cardiac Enzymes: No results for input(s): CKTOTAL, CKMB, CKMBINDEX, TROPONINI in the last 168 hours. BNP (last 3 results) No results for input(s): PROBNP in the last 8760 hours. HbA1C: No results for input(s): HGBA1C in the last 72 hours. CBG: No results for input(s): GLUCAP in the last 168 hours. Lipid Profile: Recent Labs    08/08/19 1632  TRIG 87   Thyroid Function Tests: Recent Labs    08/09/19 0436  TSH 2.402   Anemia Panel: Recent Labs    08/08/19 1632 08/09/19 0436  VITAMINB12  --  1,495*  FERRITIN 40  --    Urine analysis:    Component Value Date/Time   COLORURINE YELLOW 08/08/2019 1739   APPEARANCEUR TURBID (A) 08/08/2019 1739   LABSPEC 1.009 08/08/2019 1739   PHURINE 5.0 08/08/2019 1739   GLUCOSEU NEGATIVE 08/08/2019 1739   HGBUR LARGE (A) 08/08/2019 1739   BILIRUBINUR NEGATIVE 08/08/2019 1739   KETONESUR NEGATIVE 08/08/2019 1739   PROTEINUR 100 (A) 08/08/2019 1739   UROBILINOGEN 1.0 02/28/2015 1550   NITRITE NEGATIVE 08/08/2019 1739   LEUKOCYTESUR LARGE (A) 08/08/2019 1739   Sepsis Labs: @LABRCNTIP (procalcitonin:4,lacticidven:4)  ) Recent Results (from the past 240 hour(s))  Blood Culture (routine x 2)     Status: None (Preliminary result)   Collection Time: 08/08/19  4:58 PM   Specimen: BLOOD  Result Value Ref Range Status   Specimen Description   Final    BLOOD RIGHT ANTECUBITAL Performed at [facility], [place] [address], [place], [place] [address]    Special Requests   Final    BOTTLES DRAWN AEROBIC AND ANAEROBIC Blood Culture adequate volume Performed at [facility] [address] [place], [place] [address]    Culture   Final    NO GROWTH < 12 HOURS Performed at [facility] [address], [place], [place] [address]    Report Status PENDING  Incomplete  Blood Culture (routine x 2)     Status: None (Preliminary result)   Collection Time: 08/08/19  4:58 PM   Specimen: BLOOD  Result Value Ref Range Status   Specimen Description   Final    BLOOD LEFT ANTECUBITAL Performed at [facility], [place] [address] [place], [place] [address]    Special Requests   Final    BOTTLES DRAWN AEROBIC AND ANAEROBIC Blood Culture adequate volume Performed at [facility] [address] [place], [place] [address]    Culture   Final    NO GROWTH < 12 HOURS Performed at [facility] [address], [place], [place] [address]    Report Status  PENDING  Incomplete  SARS CORONAVIRUS 2 (TAT 6-24 HRS) Nasopharyngeal Nasopharyngeal Swab     Status: None   Collection Time: 08/08/19  5:48 PM   Specimen: Nasopharyngeal Swab  Result Value Ref Range Status   SARS Coronavirus 2 NEGATIVE NEGATIVE Final    Comment: (NOTE) SARS-CoV-2 target nucleic acids are NOT DETECTED. The SARS-CoV-2 RNA is generally detectable in upper and lower respiratory specimens during the acute phase of infection. Negative results do not preclude SARS-CoV-2 infection, do not rule out co-infections with other pathogens, and should not be used as the sole basis for treatment or other patient management decisions. Negative results must be combined with clinical observations, patient history, and epidemiological information. The expected result is Negative. Fact Sheet for Patients: SugarRoll.be Fact Sheet for Healthcare Providers: https://www.woods-mathews.com/ This test is not yet approved or cleared by the [place] FDA and  has been authorized for detection and/or diagnosis of SARS-CoV-2 by FDA under an Emergency Use Authorization (EUA). This EUA will remain  in effect (meaning this test can be used) for the duration of  the COVID-19 declaration under Section 56 4(b)(1) of the Act, 21 U.S.C. section 360bbb-3(b)(1), unless the authorization is terminated or revoked sooner. Performed at [facility], [place] [address] [place], [place] [address]       Studies: CT HEAD WO CONTRAST  Result Date: 08/08/2019 CLINICAL DATA:  Altered mental status EXAM: CT HEAD WITHOUT CONTRAST TECHNIQUE: Contiguous axial images were obtained from the base of the skull through the vertex without intravenous contrast. COMPARISON:  February 11, 2015 FINDINGS: Brain: No evidence of acute territorial infarction, hemorrhage, hydrocephalus,extra-axial collection or mass lesion/mass effect. There is dilatation the ventricles and sulci consistent with age-related atrophy. Low-attenuation changes in the deep white matter consistent with small vessel ischemia. Vascular: No hyperdense vessel or unexpected calcification. Skull: The skull is intact. No fracture or focal lesion identified. Sinuses/Orbits: The visualized paranasal sinuses and mastoid air cells are clear. The orbits and globes intact. Other: None IMPRESSION: No acute intracranial abnormality. Findings consistent with age related atrophy and chronic small vessel ischemia Electronically Signed   By: Prudencio Pair M.D.   On: 08/08/2019 20:53   DG Chest Port 1 View  Result Date: 08/08/2019 CLINICAL DATA:  Cough. EXAM: PORTABLE CHEST 1 VIEW COMPARISON:  04/30/2019 FINDINGS: Cardiomediastinal contours are stable within normal limits. Lungs are clear aside from a subtle opacity at the left lung base. No signs of pleural effusion or dense consolidation. Signs of mitral annular calcification. Visualized skeletal structures are unremarkable. IMPRESSION: Basilar atelectasis.  No acute cardiopulmonary disease. Electronically Signed   By: Zetta Bills M.D.   On: 08/08/2019 18:22   VAS [place] LOWER EXTREMITY VENOUS (DVT) (ONLY [facility] & [facility] 7a-7p)  Result Date: 08/09/2019  Lower Venous Study Indications:  Swelling.  Risk Factors: None identified. Limitations: Poor ultrasound/tissue interface. Comparison Study: No prior studies. Performing Technologist: Oliver Hum RVT  Examination Guidelines: A complete evaluation includes B-mode imaging, spectral Doppler, color Doppler, and power Doppler as needed of all accessible portions of each vessel. Bilateral testing is considered an integral part of a complete examination. Limited examinations for reoccurring indications may be performed as noted.  +---------+---------------+---------+-----------+----------+--------------+ RIGHT    CompressibilityPhasicitySpontaneityPropertiesThrombus Aging +---------+---------------+---------+-----------+----------+--------------+ CFV      Full           Yes      Yes                                 +---------+---------------+---------+-----------+----------+--------------+  SFJ      Full                                                        +---------+---------------+---------+-----------+----------+--------------+ FV Prox  Full                                                        +---------+---------------+---------+-----------+----------+--------------+ FV Mid   Full                                                        +---------+---------------+---------+-----------+----------+--------------+ FV DistalFull                                                        +---------+---------------+---------+-----------+----------+--------------+ PFV      Full                                                        +---------+---------------+---------+-----------+----------+--------------+ POP      Full           Yes      Yes                                 +---------+---------------+---------+-----------+----------+--------------+ PTV      Full                                                        +---------+---------------+---------+-----------+----------+--------------+ PERO      Full                                                        +---------+---------------+---------+-----------+----------+--------------+   +---------+---------------+---------+-----------+----------+--------------+ LEFT     CompressibilityPhasicitySpontaneityPropertiesThrombus Aging +---------+---------------+---------+-----------+----------+--------------+ CFV      Full           Yes      Yes                                 +---------+---------------+---------+-----------+----------+--------------+ SFJ      Full                                                        +---------+---------------+---------+-----------+----------+--------------+  FV Prox  Full                                                        +---------+---------------+---------+-----------+----------+--------------+ FV Mid   Full                                                        +---------+---------------+---------+-----------+----------+--------------+ FV DistalFull                                                        +---------+---------------+---------+-----------+----------+--------------+ PFV      Full                                                        +---------+---------------+---------+-----------+----------+--------------+ POP      Full           Yes      Yes                                 +---------+---------------+---------+-----------+----------+--------------+ PTV      Full                                                        +---------+---------------+---------+-----------+----------+--------------+ PERO     Full                                                        +---------+---------------+---------+-----------+----------+--------------+ Gastroc  None                                         Acute          +---------+---------------+---------+-----------+----------+--------------+     Summary: Right: There is no evidence of deep vein  thrombosis in the lower extremity. However, portions of this examination were limited- see technologist comments above. No cystic structure found in the popliteal fossa. Left: Findings consistent with acute deep vein thrombosis involving the left gastrocnemius veins. No cystic structure found in the popliteal fossa.  *See table(s) above for measurements and observations.    Preliminary     Scheduled Meds: . aspirin  325 mg Oral Daily  . cholecalciferol  5,000 Units Oral Daily  . enoxaparin (LOVENOX) injection  40 mg Subcutaneous Q24H  . feeding supplement (PRO-STAT SUGAR FREE 64)  30 mL Oral BID  . finasteride  5 mg Oral  QHS  . multivitamin with minerals  1 tablet Oral Daily  . simvastatin  10 mg Oral QHS  . vitamin B-12  5,000 mcg Oral Daily    Continuous Infusions: . cefTRIAXone (ROCEPHIN)  IV       LOS: 0 days     Alma Friendly, MD [facility]  If 7PM-7AM, please contact night-coverage [email or web address] 08/09/2019, 10:58 AM

## 2019-08-09 NOTE — Progress Notes (Signed)
ANTICOAGULATION CONSULT NOTE - Initial Consult  Pharmacy Consult for apixaban Indication: DVT  No Known Allergies  Patient Measurements: Height: 5\' 8"  (172.7 cm) Weight: 177 lb 7.5 oz (80.5 kg) IBW/kg (Calculated) : 68.4  Vital Signs: Temp: 98.4 F (36.9 C) (12/23 0454) Temp Source: Oral (12/23 0454) BP: 130/77 (12/23 1147) Pulse Rate: 70 (12/23 1147)  Labs: Recent Labs    08/08/19 1632 08/09/19 0436  HGB 12.3* 12.3*  HCT 41.1 41.6  PLT 326 312  CREATININE 1.61* 1.62*    Estimated Creatinine Clearance: 30.5 mL/min (A) (by C-G formula based on SCr of 1.62 mg/dL (H)).   Medical History: Past Medical History:  Diagnosis Date  . Hepatitis B   . HOH (hard of hearing)   . Hyperlipidemia   . Hypertension   . Stroke ([place])   . UTI (urinary tract infection)     Assessment: Pharmacy consulted to dose apixaban for acute LLE DVT. Pt was not on anticoagulation PTA. Received enoxaparin 40 mg dose on 12/22.   Today, 08/09/19 -Hgb 12.3, slightly low but stable -Plt - WNL, stable -SCr 1.62, CrCl 30 mL/min. Appears to be ~baseline -ASA 325 mg discontinued by MD  Goal of Therapy:  Monitor platelets by anticoagulation protocol: Yes   Plan:   Discontinue enoxaparin 40 mg  Apixaban 10 mg PO BID x 7 days followed by apixaban 5 mg PO BID  Monitor CBC and renal function  Monitor for signs/symptoms of bleeding  Pt will need discharge education  Lenis Noon, PharmD 08/09/2019,12:21 PM

## 2019-08-09 NOTE — ED Notes (Signed)
Heard pt from nurses station and went in to find pt had slid himself to the foot of the bed and had urinated on himself.  Pt also advised he had a small bowel movement.  The pt was cleaned and changed into a clean gown.  I also put a brief on the pt.  The pt was then assisted back into the bed.  Pt had call light beside his hand and was instructed to use it if he needed to use the restroom.  Pt understood.  Pt resting at this time.

## 2019-08-09 NOTE — Progress Notes (Signed)
Bilateral lower extremity venous duplex has been completed. Preliminary results can be found in CV Proc through chart review.  Results were given to Cari RN.  08/09/19 8:53 AM Barry Swanson RVT

## 2019-08-09 NOTE — Progress Notes (Signed)
Patient arrived on the unit at [facility].  Vital signs taken and patient settled in the room with call bell within reach.

## 2019-08-09 NOTE — ED Notes (Signed)
Entered pts chart to answer question for physical therapy regarding imaging result.

## 2019-08-10 DIAGNOSIS — I824Y2 Acute embolism and thrombosis of unspecified deep veins of left proximal lower extremity: Secondary | ICD-10-CM

## 2019-08-10 LAB — BASIC METABOLIC PANEL
Anion gap: 7 (ref 5–15)
BUN: 24 mg/dL — ABNORMAL HIGH (ref 8–23)
CO2: 27 mmol/L (ref 22–32)
Calcium: 8.3 mg/dL — ABNORMAL LOW (ref 8.9–10.3)
Chloride: 110 mmol/L (ref 98–111)
Creatinine, Ser: 1.58 mg/dL — ABNORMAL HIGH (ref 0.61–1.24)
GFR calc Af Amer: 45 mL/min — ABNORMAL LOW (ref >60)
GFR calc non Af Amer: 38 mL/min — ABNORMAL LOW (ref >60)
Glucose, Bld: 96 mg/dL (ref 70–99)
Potassium: 3.8 mmol/L (ref 3.5–5.1)
Sodium: 144 mmol/L (ref 135–145)

## 2019-08-10 LAB — CBC WITH DIFFERENTIAL/PLATELET
Abs Immature Granulocytes: 0.05 10*3/uL (ref 0.00–0.07)
Basophils Absolute: 0.1 10*3/uL (ref 0.0–0.1)
Basophils Relative: 1 %
Eosinophils Absolute: 0.4 10*3/uL (ref 0.0–0.5)
Eosinophils Relative: 5 %
HCT: 40.2 % (ref 39.0–52.0)
Hemoglobin: 11.8 g/dL — ABNORMAL LOW (ref 13.0–17.0)
Immature Granulocytes: 1 %
Lymphocytes Relative: 21 %
Lymphs Abs: 1.9 10*3/uL (ref 0.7–4.0)
MCH: 28.1 pg (ref 26.0–34.0)
MCHC: 29.4 g/dL — ABNORMAL LOW (ref 30.0–36.0)
MCV: 95.7 fL (ref 80.0–100.0)
Monocytes Absolute: 0.7 10*3/uL (ref 0.1–1.0)
Monocytes Relative: 7 %
Neutro Abs: 6.3 10*3/uL (ref 1.7–7.7)
Neutrophils Relative %: 65 %
Platelets: 315 10*3/uL (ref 150–400)
RBC: 4.2 MIL/uL — ABNORMAL LOW (ref 4.22–5.81)
RDW: 14.6 % (ref 11.5–15.5)
WBC: 9.4 10*3/uL (ref 4.0–10.5)
nRBC: 0 % (ref 0.0–0.2)

## 2019-08-10 LAB — URINE CULTURE: Culture: 50000 — AB

## 2019-08-10 MED ORDER — LORAZEPAM 2 MG/ML IJ SOLN
0.5000 mg | INTRAMUSCULAR | Status: AC | PRN
  Administered 2019-08-10: 0.5 mg via INTRAVENOUS
  Filled 2019-08-10: qty 1

## 2019-08-10 MED ORDER — LORAZEPAM BOLUS VIA INFUSION: 0.5000 mg | INTRAVENOUS | Status: DC | PRN

## 2019-08-10 MED ORDER — ORAL CARE MOUTH RINSE
15.0000 mL | Freq: Two times a day (BID) | OROMUCOSAL | Status: DC
  Administered 2019-08-11 – 2019-08-15 (×8): 15 mL via OROMUCOSAL

## 2019-08-10 NOTE — Plan of Care (Signed)

## 2019-08-10 NOTE — Evaluation (Signed)
Clinical/Bedside Swallow Evaluation Patient Details  Name: Barry Swanson MRN: [id number] Date of Birth: March 22, 1929  Today's Date: 08/10/2019 Time: SLP Start Time (ACUTE ONLY): 0930 SLP Stop Time (ACUTE ONLY): 0950 SLP Time Calculation (min) (ACUTE ONLY): 20 min  Past Medical History:  Past Medical History:  Diagnosis Date  . Hepatitis B   . HOH (hard of hearing)   . Hyperlipidemia   . Hypertension   . Stroke ([place])   . UTI (urinary tract infection)    Past Surgical History:  Past Surgical History:  Procedure Laterality Date  . BALLOON DILATION N/A 2019-07-26   Procedure: BALLOON DILATION;  Surgeon: Wonda Horner, MD;  Location: [facility];  Service: Endoscopy;  Laterality: N/A;  . BLADDER STONE REMOVAL     blasted  . CATARACT EXTRACTION W/ INTRAOCULAR LENS  IMPLANT, BILATERAL  2004  . CYSTOSCOPY WITH LITHOLAPAXY N/A 06/01/2019   Procedure: CYSTOSCOPY WITH LITHOLAPAXY;  Surgeon: Ardis Hughs, MD;  Location: [facility];  Service: Urology;  Laterality: N/A;  . ESOPHAGOGASTRODUODENOSCOPY (EGD) WITH PROPOFOL N/A 26-Jul-2019   Procedure: ESOPHAGOGASTRODUODENOSCOPY (EGD) WITH PROPOFOL WITH POSSIBLE DIL;  Surgeon: Wonda Horner, MD;  Location: [facility];  Service: Endoscopy;  Laterality: N/A;  . PROSTATE SURGERY     trimmed prostate away from urethral tract   HPI:  85 y.o. male,  w hypertension, hyperlipidemia, h/o stroke, schatski ring s/p esophageal dilation, 07/26/19 whose wife apparently died of covid-19 on 08/23/2023 presents with " can't keep food down, and temp 100.  Pt was apparently brought from his pcp office to ER for reflux of food as well as lower extremity swelling.   08/08/19 CT head negative; CXR 08/08/19 indicated basilar atelectasis with no acute cardiopulmonary disease; 06/02/19 esophagram completed indicating moderate-severe esophageal dysmotility; BSE ordered.  Assessment / Plan / Recommendation Clinical Impression   Pt exhibits pharyngoesophageal dysphagia  characterized by delayed cough/delayed throat clearing after all intake completed; nursing voiced min coughing with medication administration when given meds whole with liquids; Belching noted as well during consumption of partial breakfast tray.  Pt with generalized oral weakness during oral motor examination; Recommend downgrade to Dysphagia 3/thin liquids to promote easier transition during meals if pt can tolerate or further downgrade prn; pt using slow rate, small bites/sips independently and educated re: esophageal precautions; pt in agreement; ST will f/u for diet tolerance/education re: esophageal precautions and implementation of these during meals and adjustment of diet prn; thank you for this consult. SLP Visit Diagnosis: Dysphagia, pharyngoesophageal phase (R13.14)    Aspiration Risk  Mild aspiration risk    Diet Recommendation   Dysphagia 3 (mechanical soft)/thin  Medication Administration: Whole meds with liquid    Other  Recommendations Oral Care Recommendations: Oral care BID   Follow up Recommendations None      Frequency and Duration min 1 x/week  1 week       Prognosis Prognosis for Safe Diet Advancement: Good      Swallow Study   General Date of Onset: 08/08/19 HPI: 85 y.o. male,  w hypertension, hyperlipidemia, h/o stroke, schatski ring s/p esophageal dilation, Jul 26, 2019 whose wife apparently died of covid-19 on August 23, 2023 presents with " can't keep food down, and temp 100.  Pt was apparently brought from his pcp office to ER for reflux of food as well as lower extremity swelling.   Type of Study: Bedside Swallow Evaluation Previous Swallow Assessment: (n/a) Diet Prior to this Study: Thin liquids;Regular Temperature Spikes Noted: No Respiratory Status: Nasal cannula History of  Recent Intubation: No Behavior/Cognition: Alert;Cooperative Oral Cavity Assessment: Within Functional Limits Oral Care Completed by SLP: No Oral Cavity - Dentition: Adequate natural  dentition;Missing dentition Vision: Functional for self-feeding Self-Feeding Abilities: Able to feed self Patient Positioning: Upright in bed Baseline Vocal Quality: Low vocal intensity Volitional Cough: Strong Volitional Swallow: Able to elicit    Oral/Motor/Sensory Function Overall Oral Motor/Sensory Function: Generalized oral weakness   Ice Chips Ice chips: Not tested   Thin Liquid Thin Liquid: Impaired Presentation: Straw Pharyngeal  Phase Impairments: Suspected delayed Swallow;Cough - Delayed    Nectar Thick Nectar Thick Liquid: Not tested   Honey Thick Honey Thick Liquid: Not tested   Puree Puree: Impaired Presentation: Self Fed Pharyngeal Phase Impairments: Cough - Delayed;Suspected delayed Swallow   Solid     Solid: Impaired Presentation: Self Fed Oral Phase Impairments: Reduced lingual movement/coordination Pharyngeal Phase Impairments: Suspected delayed Swallow;Cough - Delayed      Elvina Sidle, M.S., CCC-SLP 08/10/2019,12:17 PM

## 2019-08-10 NOTE — Progress Notes (Signed)
PROGRESS NOTE  Barry Swanson IRW:[id number] DOB: July 31, 1929 DOA: 08/08/2019 PCP: [name], MD  HPI/Recap of past 24 hours: HPI from Dr [name]  is a 85 y.o. male,  w hypertension, hyperlipidemia, h/o stroke, schatski ring s/p esophageal dilation on 07-24-19 whose wife apparently died of covid-19 on August 21, 2023 presents with " can't keep food down, and temp 100.  Pt was apparently brought from his pcp office to ER for reflux of food as well as lower extremity swelling. Pt also noted to have some AMS. In the ED, VSS. UA positive for WBC>50, rbc>50. Pt admitted for AMS 2/2 to UTI.  Patient is a very poor historian.    Today, patient denies any new complaints, denies any chest pain, shortness of breath, abdominal pain, nausea/vomiting, fever/chills.  Patient still with cough  Assessment/Plan: Active Problems:   AMS (altered mental status)  Acute metabolic encephalopathy Mentation improving, not at baseline, very poor historian (HOH) Unknown etiology, ??Cognitive impairment TSH, B12, all within normal limits, RPR nonreactive CT head with no acute intracranial abnormalities Monitor closely  ??UTI Low-grade temp admission, no leukocytosis UA with WBC greater than 50, RBC greater than 50 UC growing 50,000 yeast DC IV ceftriaxone  Dysphagia likely 2/2 Schatzki ring Status post esophageal dilatation on 2019-07-24, by Dr. Maryann Alar to have trouble swallowing, with chronic cough Chest x-ray with bibasilar atelectasis, no acute cardiopulmonary disease SLP on board, mild aspiration risk, recommend dysphagia type III diet  LLE acute DVT Doppler showed DVT noted in the left gastrocnemius veins Start apixaban, pharmacy consulted BNP 197.5 Echo showed EF of 55 to 76%, grade 1 diastolic dysfunction  CKD stage IIIb Creatinine stable at baseline Daily BMP  H/O CVA/hyperlipidemia Continue simvastatin, hold ASA for now  BPH Continue Proscar        Malnutrition  Type:      Malnutrition Characteristics:      Nutrition Interventions:       Estimated body mass index is 27.42 kg/m as calculated from the following:   Height as of this encounter: 5\' 8"  (1.727 m).   Weight as of this encounter: 81.8 kg.     Code Status: Full  Family Communication: None at bedside  Disposition Plan: To be determined   Consultants:  None  Procedures:  None  Antimicrobials:  None  DVT prophylaxis: Eliquis   Objective: Vitals:   08/09/19 1854 08/09/19 2038 08/10/19 0500 08/10/19 1258  BP: (!) 177/96 136/77 140/80 128/72  Pulse:  76 80 71  Resp: (!) 22 20 20 16   Temp: 98.7 F (37.1 C) 98 F (36.7 C) 98.4 F (36.9 C) 98.1 F (36.7 C)  TempSrc: Oral Oral Oral Oral  SpO2: 95% 94% 95% 100%  Weight:   81.8 kg   Height:        Intake/Output Summary (Last 24 hours) at 08/10/2019 1618 Last data filed at 08/10/2019 1458 Gross per 24 hour  Intake 1474.45 ml  Output 700 ml  Net 774.45 ml   Filed Weights   08/08/19 1631 08/10/19 0500  Weight: 80.5 kg 81.8 kg    Exam:  General: NAD, awake, alert, oriented to self  Cardiovascular: S1, S2 present  Respiratory: CTAB  Abdomen: Soft, nontender, nondistended, bowel sounds present  Musculoskeletal: Trace bilateral pedal edema noted  Skin: Normal  Psychiatry: Normal mood  Data Reviewed: CBC: Recent Labs  Lab 08/08/19 1632 08/09/19 0436 08/10/19 0429  WBC 10.0 8.5 9.4  NEUTROABS 5.6  --  6.3  HGB 12.3* 12.3*  11.8*  HCT 41.1 41.6 40.2  MCV 96.5 96.3 95.7  PLT 326 312 557   Basic Metabolic Panel: Recent Labs  Lab 08/08/19 1632 08/09/19 0436  NA 139 141  K 4.0 4.0  CL 104 109  CO2 24 25  GLUCOSE 86 100*  BUN 22 20  CREATININE 1.61* 1.62*  CALCIUM 8.7* 8.5*   GFR: Estimated Creatinine Clearance: 30.5 mL/min (A) (by C-G formula based on SCr of 1.62 mg/dL (H)). Liver Function Tests: Recent Labs  Lab 08/08/19 1632 08/09/19 0436  AST 17 12*  ALT 12 9   ALKPHOS 73 68  BILITOT 0.5 0.5  PROT 6.1* 5.5*  ALBUMIN 3.1* 2.7*   No results for input(s): LIPASE, AMYLASE in the last 168 hours. No results for input(s): AMMONIA in the last 168 hours. Coagulation Profile: No results for input(s): INR, PROTIME in the last 168 hours. Cardiac Enzymes: No results for input(s): CKTOTAL, CKMB, CKMBINDEX, TROPONINI in the last 168 hours. BNP (last 3 results) No results for input(s): PROBNP in the last 8760 hours. HbA1C: No results for input(s): HGBA1C in the last 72 hours. CBG: No results for input(s): GLUCAP in the last 168 hours. Lipid Profile: Recent Labs    08/08/19 1632  TRIG 87   Thyroid Function Tests: Recent Labs    08/09/19 0436  TSH 2.402   Anemia Panel: Recent Labs    08/08/19 1632 08/09/19 0436  VITAMINB12  --  1,495*  FERRITIN 40  --    Urine analysis:    Component Value Date/Time   COLORURINE YELLOW 08/08/2019 1739   APPEARANCEUR TURBID (A) 08/08/2019 1739   LABSPEC 1.009 08/08/2019 1739   PHURINE 5.0 08/08/2019 1739   GLUCOSEU NEGATIVE 08/08/2019 1739   HGBUR LARGE (A) 08/08/2019 1739   BILIRUBINUR NEGATIVE 08/08/2019 1739   KETONESUR NEGATIVE 08/08/2019 1739   PROTEINUR 100 (A) 08/08/2019 1739   UROBILINOGEN 1.0 02/28/2015 1550   NITRITE NEGATIVE 08/08/2019 1739   LEUKOCYTESUR LARGE (A) 08/08/2019 1739   Sepsis Labs: @LABRCNTIP (procalcitonin:4,lacticidven:4)  ) Recent Results (from the past 240 hour(s))  Blood Culture (routine x 2)     Status: None (Preliminary result)   Collection Time: 08/08/19  4:58 PM   Specimen: BLOOD  Result Value Ref Range Status   Specimen Description   Final    BLOOD RIGHT ANTECUBITAL Performed at [facility], [place] [address] [place], [place] [address]    Special Requests   Final    BOTTLES DRAWN AEROBIC AND ANAEROBIC Blood Culture adequate volume Performed at [place] [address], [place], [place] [address]    Culture   Final    NO GROWTH  2 DAYS Performed at [place] [address] [place], [place] [address]    Report Status PENDING  Incomplete  Blood Culture (routine x 2)     Status: None (Preliminary result)   Collection Time: 08/08/19  4:58 PM   Specimen: BLOOD  Result Value Ref Range Status   Specimen Description   Final    BLOOD LEFT ANTECUBITAL Performed at [facility], [place] [address] [place] [address], [place], [place] [address]    Special Requests   Final    BOTTLES DRAWN AEROBIC AND ANAEROBIC Blood Culture adequate volume Performed at [facility] [address] [place], [place] [address]    Culture   Final    NO GROWTH 2 DAYS Performed at [place] [address] [place],  [address]  Report Status PENDING  Incomplete  Urine culture     Status: Abnormal   Collection Time: 08/08/19  5:39 PM   Specimen: Urine, Clean Catch  Result Value Ref Range Status   Specimen Description URINE, CLEAN CATCH  Final   Special Requests NONE  Final   Culture 50,000 COLONIES/mL YEAST (A)  Final   Report Status 08/10/2019 FINAL  Final  SARS CORONAVIRUS 2 (TAT 6-24 HRS) Nasopharyngeal Nasopharyngeal Swab     Status: None   Collection Time: 08/08/19  5:48 PM   Specimen: Nasopharyngeal Swab  Result Value Ref Range Status   SARS Coronavirus 2 NEGATIVE NEGATIVE Final    Comment: (NOTE) SARS-CoV-2 target nucleic acids are NOT DETECTED. The SARS-CoV-2 RNA is generally detectable in upper and lower respiratory specimens during the acute phase of infection. Negative results do not preclude SARS-CoV-2 infection, do not rule out co-infections with other pathogens, and should not be used as the sole basis for treatment or other patient management decisions. Negative results must be combined with clinical observations, patient history, and epidemiological information. The expected result is Negative. Fact Sheet for Patients: SugarRoll.be Fact Sheet for  Healthcare Providers: https://www.woods-mathews.com/ This test is not yet approved or cleared by the [place] FDA and  has been authorized for detection and/or diagnosis of SARS-CoV-2 by FDA under an Emergency Use Authorization (EUA). This EUA will remain  in effect (meaning this test can be used) for the duration of the COVID-19 declaration under Section 56 4(b)(1) of the Act, 21 U.S.C. section 360bbb-3(b)(1), unless the authorization is terminated or revoked sooner. Performed at [facility], [place] [address] [place], [place] [address]       Studies: No results found.  Scheduled Meds: . apixaban  10 mg Oral BID   Followed by  . [START ON 08/16/2019] apixaban  5 mg Oral BID  . cholecalciferol  5,000 Units Oral Daily  . feeding supplement (PRO-STAT SUGAR FREE 64)  30 mL Oral BID  . finasteride  5 mg Oral QHS  . [START ON 08/11/2019] mouth rinse  15 mL Mouth Rinse BID  . multivitamin with minerals  1 tablet Oral Daily  . simvastatin  10 mg Oral QHS  . vitamin B-12  5,000 mcg Oral Daily    Continuous Infusions: . sodium chloride 250 mL (08/09/19 1958)  . cefTRIAXone (ROCEPHIN)  IV 1 g (08/09/19 2001)     LOS: 1 day     Alma Friendly, MD [facility]  If 7PM-7AM, please contact night-coverage [email or web address] 08/10/2019, 4:18 PM

## 2019-08-10 NOTE — Progress Notes (Signed)
Spoke with pt daughter Barry Swanson with pt permission. Gave updates about pt status. Informed family that pt is allowed one visitor during the stay. All other questions and concerns addressed at this time.  Family does not verbalize any further needs or concerns at this time.

## 2019-08-10 NOTE — Evaluation (Signed)
Physical Therapy Evaluation Patient Details Name: Barry Swanson MRN: [id number] DOB: March 19, 1929 Today's Date: 08/10/2019   History of Present Illness  Pt is 85 y.o. male,  w hypertension, hyperlipidemia, h/o stroke, schatski ring s/p esophageal dilation, 22-Jul-2019 whose wife apparently died of covid-19 on 2023-08-19.  He was admitted with  AMS secondary to UTI.  Pt found to have DVT of L LE and has been on blood thinners (lovenox, eliquis) >24 hours.  Clinical Impression  Pt admitted with above diagnosis. Pt with mild confusion - questionable historian in regards to level of assist and PLOF at home.  He ambulated 27' and transferred with min A, but required total assist for ADLs.  Pt reports that this is near his baseline.  However, pt does have weakness and unsteadiness and will benefit from PT to maximize mobility while hospitalized. Pt currently with functional limitations due to the deficits listed below (see PT Problem List). Pt will benefit from skilled PT to increase their independence and safety with mobility to allow discharge to the venue listed below.       Follow Up Recommendations Home health PT;Supervision/Assistance - 24 hour(pt reports has 24 hr support)    Equipment Recommendations  None recommended by PT    Recommendations for Other Services       Precautions / Restrictions Precautions Precautions: Fall      Mobility  Bed Mobility Overal bed mobility: Needs Assistance Bed Mobility: Supine to Sit;Sit to Supine     Supine to sit: Min assist Sit to supine: Min assist   General bed mobility comments: increased time  Transfers Overall transfer level: Needs assistance Equipment used: Rolling walker (2 wheeled) Transfers: Sit to/from [facility] Sit to Stand: Min assist Stand pivot transfers: Min assist       General transfer comment: increased time; cues for sequence and safety; sit to stand x 2; toileting ADLs max  A  Ambulation/Gait Ambulation/Gait assistance: Min assist Gait Distance (Feet): 50 Feet Assistive device: 4-wheeled walker Gait Pattern/deviations: Decreased stride length;Trunk flexed;Drifts right/left Gait velocity: decreased   General Gait Details: moderate unsteadiness but no overt LOB; fatigued easily; cued to keep rollator with him until completely back to chair; cued for posture  Stairs            Wheelchair Mobility    Modified Rankin (Stroke Patients Only)       Balance Overall balance assessment: Needs assistance Sitting-balance support: Bilateral upper extremity supported;Feet supported Sitting balance-Leahy Scale: Good     Standing balance support: Bilateral upper extremity supported;During functional activity Standing balance-Leahy Scale: Poor                               Pertinent Vitals/Pain Pain Assessment: No/denies pain    Home Living Family/patient expects to be discharged to:: Private residence Living Arrangements: Children(reports lives with son in laws; wife just died from [place]) Available Help at Discharge: Family Type of Home: House Home Access: Stairs to enter Entrance Stairs-Rails: None Technical brewer of Steps: 2 Home Layout: One level Home Equipment: Environmental consultant - 4 wheels      Prior Function Level of Independence: Needs assistance   Gait / Transfers Assistance Needed: uses rollator; household distances  ADL's / Homemaking Assistance Needed: Family assist with ADLs  Comments: questionable historian due to some confusion     Hand Dominance        Extremity/Trunk Assessment   Upper Extremity Assessment Upper Extremity  Assessment: Overall WFL for tasks assessed    Lower Extremity Assessment Lower Extremity Assessment: RLE deficits/detail;LLE deficits/detail RLE Deficits / Details: Demonstrating ROM WFL and MMT 4+/5 throughout; moderate genu varus noted LLE Deficits / Details: Demonstrating ROM WFL and MMT  4+/5 throughout; moderate genu varus noted    Cervical / Trunk Assessment Cervical / Trunk Assessment: Kyphotic  Communication   Communication: HOH  Cognition Arousal/Alertness: Awake/alert Behavior During Therapy: WFL for tasks assessed/performed Overall Cognitive Status: No family/caregiver present to determine baseline cognitive functioning Area of Impairment: Orientation;Following commands;Safety/judgement;Problem solving                 Orientation Level: Disoriented to;Time     Following Commands: Follows one step commands with increased time Safety/Judgement: Decreased awareness of safety   Problem Solving: Requires verbal cues;Requires tactile cues;Slow processing        General Comments General comments (skin integrity, edema, etc.): vss on 2 LPM O2    Exercises     Assessment/Plan    PT Assessment Patient needs continued PT services  PT Problem List Decreased strength;Decreased mobility;Decreased safety awareness;Decreased range of motion;Decreased activity tolerance;Decreased cognition;Cardiopulmonary status limiting activity;Decreased balance;Decreased knowledge of use of DME       PT Treatment Interventions DME instruction;Therapeutic activities;Gait training;Therapeutic exercise;Patient/family education;Functional mobility training;Stair training    PT Goals (Current goals can be found in the Care Plan section)  Acute Rehab PT Goals Patient Stated Goal: return home PT Goal Formulation: With patient Time For Goal Achievement: 08/24/19 Potential to Achieve Goals: Good    Frequency Min 3X/week   Barriers to discharge        Co-evaluation               AM-PAC PT "6 Clicks" Mobility  Outcome Measure Help needed turning from your back to your side while in a flat bed without using bedrails?: A Little Help needed moving from lying on your back to sitting on the side of a flat bed without using bedrails?: A Little Help needed moving to and  from a bed to a chair (including a wheelchair)?: A Little Help needed standing up from a chair using your arms (e.g., wheelchair or bedside chair)?: A Little Help needed to walk in hospital room?: A Little Help needed climbing 3-5 steps with a railing? : A Lot 6 Click Score: 17    End of Session Equipment Utilized During Treatment: Gait belt;Oxygen Activity Tolerance: Patient tolerated treatment well Patient left: in chair;with chair alarm set;with call bell/phone within reach Nurse Communication: Mobility status PT Visit Diagnosis: Other abnormalities of gait and mobility (R26.89);Unsteadiness on feet (R26.81);Muscle weakness (generalized) (M62.81)    Time: 1962-2297 PT Time Calculation (min) (ACUTE ONLY): 34 min   Charges:   PT Evaluation $PT Eval Low Complexity: 1 Low PT Treatments $Therapeutic Activity: 8-22 mins        Maggie Font, PT Acute Rehab Services Pager [phone] [facility] [phone] [facility] [phone]   Barry Swanson 08/10/2019, 1:51 PM

## 2019-08-11 DIAGNOSIS — G9341 Metabolic encephalopathy: Secondary | ICD-10-CM

## 2019-08-11 LAB — CBC WITH DIFFERENTIAL/PLATELET
Abs Immature Granulocytes: 0.03 10*3/uL (ref 0.00–0.07)
Basophils Absolute: 0.1 10*3/uL (ref 0.0–0.1)
Basophils Relative: 1 %
Eosinophils Absolute: 0.6 10*3/uL — ABNORMAL HIGH (ref 0.0–0.5)
Eosinophils Relative: 7 %
HCT: 39.4 % (ref 39.0–52.0)
Hemoglobin: 11.6 g/dL — ABNORMAL LOW (ref 13.0–17.0)
Immature Granulocytes: 0 %
Lymphocytes Relative: 23 %
Lymphs Abs: 2.1 10*3/uL (ref 0.7–4.0)
MCH: 28.2 pg (ref 26.0–34.0)
MCHC: 29.4 g/dL — ABNORMAL LOW (ref 30.0–36.0)
MCV: 95.6 fL (ref 80.0–100.0)
Monocytes Absolute: 0.6 10*3/uL (ref 0.1–1.0)
Monocytes Relative: 7 %
Neutro Abs: 5.6 10*3/uL (ref 1.7–7.7)
Neutrophils Relative %: 62 %
Platelets: 299 10*3/uL (ref 150–400)
RBC: 4.12 MIL/uL — ABNORMAL LOW (ref 4.22–5.81)
RDW: 14.6 % (ref 11.5–15.5)
WBC: 9 10*3/uL (ref 4.0–10.5)
nRBC: 0 % (ref 0.0–0.2)

## 2019-08-11 LAB — BASIC METABOLIC PANEL
Anion gap: 8 (ref 5–15)
BUN: 30 mg/dL — ABNORMAL HIGH (ref 8–23)
CO2: 26 mmol/L (ref 22–32)
Calcium: 8.8 mg/dL — ABNORMAL LOW (ref 8.9–10.3)
Chloride: 108 mmol/L (ref 98–111)
Creatinine, Ser: 1.75 mg/dL — ABNORMAL HIGH (ref 0.61–1.24)
GFR calc Af Amer: 39 mL/min — ABNORMAL LOW (ref >60)
GFR calc non Af Amer: 34 mL/min — ABNORMAL LOW (ref >60)
Glucose, Bld: 104 mg/dL — ABNORMAL HIGH (ref 70–99)
Potassium: 3.7 mmol/L (ref 3.5–5.1)
Sodium: 142 mmol/L (ref 135–145)

## 2019-08-11 MED ORDER — FLUCONAZOLE IN SODIUM CHLORIDE 200-0.9 MG/100ML-% IV SOLN
200.0000 mg | Freq: Every day | INTRAVENOUS | Status: AC
  Administered 2019-08-11: 200 mg via INTRAVENOUS
  Filled 2019-08-11: qty 100

## 2019-08-11 NOTE — Plan of Care (Signed)
  Problem: Education: Goal: Knowledge of General Education information will improve Description: Including pain rating scale, medication(s)/side effects and non-pharmacologic comfort measures Outcome: Progressing   Problem: Clinical Measurements: Goal: Respiratory complications will improve Outcome: Progressing Goal: Cardiovascular complication will be avoided Outcome: Progressing   Problem: Coping: Goal: Level of anxiety will decrease Outcome: Progressing   Problem: Elimination: Goal: Will not experience complications related to urinary retention Outcome: Progressing   Problem: Pain Managment: Goal: General experience of comfort will improve Outcome: Progressing   Problem: Safety: Goal: Ability to remain free from injury will improve Outcome: Progressing   Problem: Skin Integrity: Goal: Risk for impaired skin integrity will decrease Outcome: Progressing   

## 2019-08-11 NOTE — Progress Notes (Signed)
PROGRESS NOTE  Barry Swanson XVQ:[id number] DOB: [birth year] DOA: 08/08/2019 PCP: [name], MD  HPI/Recap of past 24 hours: HPI from Dr [name]  is a 85 y.o. male,  w hypertension, hyperlipidemia, h/o stroke, schatski ring s/p esophageal dilation on June 30, 2019 whose wife apparently died of covid-19 on 07-28-2023 presents with " can't keep food down, and temp 100.  Pt was apparently brought from his pcp office to ER for reflux of food as well as lower extremity swelling. Pt also noted to have some AMS. In the ED, VSS. UA positive for WBC>50, rbc>50. Pt admitted for AMS 2/2 to UTI.  Patient is a very poor historian.    Today, patient noted to be pretty sleepy, was given Ativan around midnight for agitation.  Looked stable.  Assessment/Plan: Active Problems:   AMS (altered mental status)  Acute metabolic encephalopathy Mentation improving, not at baseline, very poor historian (HOH) Unknown etiology, ??Cognitive impairment TSH, B12, all within normal limits, RPR nonreactive CT head with no acute intracranial abnormalities Monitor closely  Unlikely UTI Low-grade temp admission, no leukocytosis UA with WBC greater than 50, RBC greater than 50 UC growing 50,000 yeast, was given 1 dose of fluconazole on 12/24 by the night team DC IV ceftriaxone  Dysphagia likely 2/2 Schatzki ring Status post esophageal dilatation on Jun 30, 2019, by Dr. Maryann Alar to have trouble swallowing, with chronic cough Chest x-ray with bibasilar atelectasis, no acute cardiopulmonary disease SLP on board, mild aspiration risk, recommend dysphagia type III diet Outpt follow up with GI  LLE acute DVT Doppler showed DVT noted in the left gastrocnemius veins Continue apixaban, pharmacy on board BNP 197.5 Echo showed EF of 55 to 09%, grade 1 diastolic dysfunction  CKD stage IIIb Creatinine stable at baseline Daily BMP  H/O CVA/hyperlipidemia Continue simvastatin, hold ASA for now  BPH Continue  Proscar        Malnutrition Type:      Malnutrition Characteristics:      Nutrition Interventions:       Estimated body mass index is 27.42 kg/m as calculated from the following:   Height as of this encounter: 5\' 8"  (1.727 m).   Weight as of this encounter: 81.8 kg.     Code Status: Full  Family Communication: None at bedside  Disposition Plan: Likely home   Consultants:  None  Procedures:  None  Antimicrobials:  None  DVT prophylaxis: Eliquis   Objective: Vitals:   08/10/19 2022 08/11/19 0525 08/11/19 1333 08/11/19 1510  BP: 128/78 122/65  134/77  Pulse: 76 65  66  Resp: 20 18  20   Temp: 98.8 F (37.1 C) 98.7 F (37.1 C)  98.3 F (36.8 C)  TempSrc:  Oral  Axillary  SpO2: 96% 97% 96% 94%  Weight:      Height:        Intake/Output Summary (Last 24 hours) at 08/11/2019 1534 Last data filed at 08/11/2019 0423 Gross per 24 hour  Intake 120 ml  Output 1225 ml  Net -1105 ml   Filed Weights   08/08/19 1631 08/10/19 0500  Weight: 80.5 kg 81.8 kg    Exam:  General: NAD, sleepy  Cardiovascular: S1, S2 present  Respiratory: CTAB  Abdomen: Soft, nontender, nondistended, bowel sounds present  Musculoskeletal: No bilateral pedal edema noted  Skin: Normal  Psychiatry: Normal mood  Data Reviewed: CBC: Recent Labs  Lab 08/08/19 1632 08/09/19 0436 08/10/19 0429 08/11/19 0520  WBC 10.0 8.5 9.4 9.0  NEUTROABS 5.6  --  6.3 5.6  HGB 12.3* 12.3* 11.8* 11.6*  HCT 41.1 41.6 40.2 39.4  MCV 96.5 96.3 95.7 95.6  PLT 326 312 315 270   Basic Metabolic Panel: Recent Labs  Lab 08/08/19 1632 08/09/19 0436 08/10/19 0429 08/11/19 0520  NA 139 141 144 142  K 4.0 4.0 3.8 3.7  CL 104 109 110 108  CO2 24 25 27 26   GLUCOSE 86 100* 96 104*  BUN 22 20 24* 30*  CREATININE 1.61* 1.62* 1.58* 1.75*  CALCIUM 8.7* 8.5* 8.3* 8.8*   GFR: Estimated Creatinine Clearance: 28.2 mL/min (A) (by C-G formula based on SCr of 1.75 mg/dL (H)). Liver  Function Tests: Recent Labs  Lab 08/08/19 1632 08/09/19 0436  AST 17 12*  ALT 12 9  ALKPHOS 73 68  BILITOT 0.5 0.5  PROT 6.1* 5.5*  ALBUMIN 3.1* 2.7*   No results for input(s): LIPASE, AMYLASE in the last 168 hours. No results for input(s): AMMONIA in the last 168 hours. Coagulation Profile: No results for input(s): INR, PROTIME in the last 168 hours. Cardiac Enzymes: No results for input(s): CKTOTAL, CKMB, CKMBINDEX, TROPONINI in the last 168 hours. BNP (last 3 results) No results for input(s): PROBNP in the last 8760 hours. HbA1C: No results for input(s): HGBA1C in the last 72 hours. CBG: No results for input(s): GLUCAP in the last 168 hours. Lipid Profile: Recent Labs    08/08/19 1632  TRIG 87   Thyroid Function Tests: Recent Labs    08/09/19 0436  TSH 2.402   Anemia Panel: Recent Labs    08/08/19 1632 08/09/19 0436  VITAMINB12  --  1,495*  FERRITIN 40  --    Urine analysis:    Component Value Date/Time   COLORURINE YELLOW 08/08/2019 1739   APPEARANCEUR TURBID (A) 08/08/2019 1739   LABSPEC 1.009 08/08/2019 1739   PHURINE 5.0 08/08/2019 1739   GLUCOSEU NEGATIVE 08/08/2019 1739   HGBUR LARGE (A) 08/08/2019 1739   BILIRUBINUR NEGATIVE 08/08/2019 1739   KETONESUR NEGATIVE 08/08/2019 1739   PROTEINUR 100 (A) 08/08/2019 1739   UROBILINOGEN 1.0 02/28/2015 1550   NITRITE NEGATIVE 08/08/2019 1739   LEUKOCYTESUR LARGE (A) 08/08/2019 1739   Sepsis Labs: @LABRCNTIP (procalcitonin:4,lacticidven:4)  ) Recent Results (from the past 240 hour(s))  Blood Culture (routine x 2)     Status: None (Preliminary result)   Collection Time: 08/08/19  4:58 PM   Specimen: BLOOD  Result Value Ref Range Status   Specimen Description   Final    BLOOD RIGHT ANTECUBITAL Performed at [facility], [place] [address], [place], [place] [address]    Special Requests   Final    BOTTLES DRAWN AEROBIC AND ANAEROBIC Blood Culture adequate volume Performed at [facility] [address], [place], [place] [address]    Culture   Final    NO GROWTH 3 DAYS Performed at [facility], [place] [address] [place], [place] [address]    Report Status PENDING  Incomplete  Blood Culture (routine x 2)     Status: None (Preliminary result)   Collection Time: 08/08/19  4:58 PM   Specimen: BLOOD  Result Value Ref Range Status   Specimen Description   Final    BLOOD LEFT ANTECUBITAL Performed at [facility], [place] [address] [place], [place] [address]    Special Requests   Final    BOTTLES DRAWN AEROBIC AND ANAEROBIC Blood Culture adequate volume Performed at [facility] [address]  [place], [place] [address]    Culture   Final    NO GROWTH 3 DAYS Performed at [facility], [place] [address], [place], [place] [address]    Report Status PENDING  Incomplete  Urine culture     Status: Abnormal   Collection Time: 08/08/19  5:39 PM   Specimen: Urine, Clean Catch  Result Value Ref Range Status   Specimen Description URINE, CLEAN CATCH  Final   Special Requests NONE  Final   Culture 50,000 COLONIES/mL YEAST (A)  Final   Report Status 08/10/2019 FINAL  Final  SARS CORONAVIRUS 2 (TAT 6-24 HRS) Nasopharyngeal Nasopharyngeal Swab     Status: None   Collection Time: 08/08/19  5:48 PM   Specimen: Nasopharyngeal Swab  Result Value Ref Range Status   SARS Coronavirus 2 NEGATIVE NEGATIVE Final    Comment: (NOTE) SARS-CoV-2 target nucleic acids are NOT DETECTED. The SARS-CoV-2 RNA is generally detectable in upper and lower respiratory specimens during the acute phase of infection. Negative results do not preclude SARS-CoV-2 infection, do not rule out co-infections with other pathogens, and should not be used as the sole basis for treatment or other patient management decisions. Negative results must be combined with clinical observations, patient history, and epidemiological information. The expected result is  Negative. Fact Sheet for Patients: SugarRoll.be Fact Sheet for Healthcare Providers: https://www.woods-mathews.com/ This test is not yet approved or cleared by the [place] FDA and  has been authorized for detection and/or diagnosis of SARS-CoV-2 by FDA under an Emergency Use Authorization (EUA). This EUA will remain  in effect (meaning this test can be used) for the duration of the COVID-19 declaration under Section 56 4(b)(1) of the Act, 21 U.S.C. section 360bbb-3(b)(1), unless the authorization is terminated or revoked sooner. Performed at [facility], [place] [address], [place], [place] [address]       Studies: No results found.  Scheduled Meds: . apixaban  10 mg Oral BID   Followed by  . [START ON 08/16/2019] apixaban  5 mg Oral BID  . cholecalciferol  5,000 Units Oral Daily  . feeding supplement (PRO-STAT SUGAR FREE 64)  30 mL Oral BID  . finasteride  5 mg Oral QHS  . mouth rinse  15 mL Mouth Rinse BID  . multivitamin with minerals  1 tablet Oral Daily  . simvastatin  10 mg Oral QHS  . vitamin B-12  5,000 mcg Oral Daily    Continuous Infusions: . sodium chloride 250 mL (08/09/19 1958)     LOS: 2 days     Alma Friendly, MD [facility]  If 7PM-7AM, please contact night-coverage [email or web address] 08/11/2019, 3:34 PM

## 2019-08-12 ENCOUNTER — Inpatient Hospital Stay (HOSPITAL_COMMUNITY): Payer: Medicare Other

## 2019-08-12 LAB — CBC WITH DIFFERENTIAL/PLATELET
Abs Immature Granulocytes: 0.04 10*3/uL (ref 0.00–0.07)
Basophils Absolute: 0 10*3/uL (ref 0.0–0.1)
Basophils Relative: 1 %
Eosinophils Absolute: 0.6 10*3/uL — ABNORMAL HIGH (ref 0.0–0.5)
Eosinophils Relative: 8 %
HCT: 39.5 % (ref 39.0–52.0)
Hemoglobin: 11.6 g/dL — ABNORMAL LOW (ref 13.0–17.0)
Immature Granulocytes: 1 %
Lymphocytes Relative: 26 %
Lymphs Abs: 2.2 10*3/uL (ref 0.7–4.0)
MCH: 28 pg (ref 26.0–34.0)
MCHC: 29.4 g/dL — ABNORMAL LOW (ref 30.0–36.0)
MCV: 95.4 fL (ref 80.0–100.0)
Monocytes Absolute: 0.5 10*3/uL (ref 0.1–1.0)
Monocytes Relative: 6 %
Neutro Abs: 5.1 10*3/uL (ref 1.7–7.7)
Neutrophils Relative %: 58 %
Platelets: 326 10*3/uL (ref 150–400)
RBC: 4.14 MIL/uL — ABNORMAL LOW (ref 4.22–5.81)
RDW: 14.5 % (ref 11.5–15.5)
WBC: 8.6 10*3/uL (ref 4.0–10.5)
nRBC: 0 % (ref 0.0–0.2)

## 2019-08-12 LAB — CBC
HCT: 41.1 % (ref 39.0–52.0)
HCT: 40.5 % (ref 39.0–52.0)
Hemoglobin: 11.8 g/dL — ABNORMAL LOW (ref 13.0–17.0)
Hemoglobin: 12.1 g/dL — ABNORMAL LOW (ref 13.0–17.0)
MCH: 28.1 pg (ref 26.0–34.0)
MCH: 28.3 pg (ref 26.0–34.0)
MCHC: 28.7 g/dL — ABNORMAL LOW (ref 30.0–36.0)
MCHC: 29.9 g/dL — ABNORMAL LOW (ref 30.0–36.0)
MCV: 97.9 fL (ref 80.0–100.0)
MCV: 94.8 fL (ref 80.0–100.0)
Platelets: 340 10*3/uL (ref 150–400)
Platelets: 330 10*3/uL (ref 150–400)
RBC: 4.2 MIL/uL — ABNORMAL LOW (ref 4.22–5.81)
RBC: 4.27 MIL/uL (ref 4.22–5.81)
RDW: 14.6 % (ref 11.5–15.5)
RDW: 14.5 % (ref 11.5–15.5)
WBC: 8.2 10*3/uL (ref 4.0–10.5)
WBC: 10.7 10*3/uL — ABNORMAL HIGH (ref 4.0–10.5)
nRBC: 0 % (ref 0.0–0.2)
nRBC: 0 % (ref 0.0–0.2)

## 2019-08-12 MED ORDER — CHLORHEXIDINE GLUCONATE CLOTH 2 % EX PADS
6.0000 | MEDICATED_PAD | Freq: Every day | CUTANEOUS | Status: DC
  Administered 2019-08-12 – 2019-08-14 (×3): 6 via TOPICAL

## 2019-08-12 NOTE — Progress Notes (Signed)
Pt incontinent of moderate amount of bloody urine and two medium sized clots noted. MD paged

## 2019-08-12 NOTE — Progress Notes (Signed)
MD  Updated via phone Maintain current plan of care and monitor Pt closely . No other acute changes noted in Pt's assessment at this time

## 2019-08-12 NOTE — Progress Notes (Signed)
PROGRESS NOTE  Barry Swanson PTW:[id number] DOB: 1929/07/03 DOA: 08/08/2019 PCP: [name], MD  HPI/Recap of past 24 hours: HPI from Dr [name]  is a 85 y.o. male,  w hypertension, hyperlipidemia, h/o stroke, schatski ring s/p esophageal dilation on 07/14/19 whose wife apparently died of covid-19 on Aug 11, 2023 presents with " can't keep food down, and temp 100.  Pt was apparently brought from his pcp office to ER for reflux of food as well as lower extremity swelling. Pt also noted to have some AMS. In the ED, VSS. UA positive for WBC>50, rbc>50. Pt admitted for AMS 2/2 to UTI.  Patient is a very poor historian.    Today, patient denies any new complaints.  Noted to have multiple episodes of gross hematuria with some clots noted.  Patient recently on Eliquis for his acute DVT.  Patient denies any chest pain, abdominal pain, dysuria, nausea/vomiting, fever/chills.  Assessment/Plan: Active Problems:   AMS (altered mental status)  Acute metabolic encephalopathy Mentation improving, not at baseline, very poor historian (HOH) Unknown etiology, ??Cognitive impairment/underlying dementia TSH, B12, all within normal limits, RPR nonreactive CT head with no acute intracranial abnormalities Monitor closely  Unlikely UTI Low-grade temp admission, no leukocytosis UA with WBC greater than 50, RBC greater than 50 UC growing 50,000 yeast, was given 1 dose of fluconazole on 12/24 by the night team DC IV ceftriaxone  Dysphagia likely 2/2 Schatzki ring Status post esophageal dilatation on 07/14/19, by Dr. Maryann Alar to have trouble swallowing, with chronic cough Chest x-ray with bibasilar atelectasis, no acute cardiopulmonary disease SLP on board, mild aspiration risk, recommend dysphagia type III diet Outpt follow up with GI  Gross hematuria Noted to have multiple episodes of gross hematuria, with some passage of clots noted History of bladder stone status post cystoscopy and  removal by urology, as well as enlarged prostate Renal ultrasound done showed heterogeneous mass within the urinary bladder with mild hydronephrosis unchanged from previous studies.  Cystoscopy warranted Urology consulted, appreciate recs Hold Eliquis for now  LLE acute DVT Doppler showed DVT noted in the left gastrocnemius veins Hold apixaban for now due to gross hematuria BNP 197.5 Echo showed EF of 55 to 70%, grade 1 diastolic dysfunction  CKD stage IIIb Creatinine stable at baseline Daily BMP  H/O CVA/hyperlipidemia Continue simvastatin, hold ASA for now  BPH Continue Proscar        Malnutrition Type:      Malnutrition Characteristics:      Nutrition Interventions:       Estimated body mass index is 27.42 kg/m as calculated from the following:   Height as of this encounter: 5\' 8"  (1.727 m).   Weight as of this encounter: 81.8 kg.     Code Status: Full  Family Communication: None at bedside  Disposition Plan: To be determined   Consultants:  Urology  Procedures:  None  Antimicrobials:  None  DVT prophylaxis: SCDs for now, hold Eliquis   Objective: Vitals:   08/11/19 1510 08/11/19 2021 08/12/19 0611 08/12/19 1308  BP: 134/77 131/78  126/81  Pulse: 66 68  66  Resp: 20 18  16   Temp: 98.3 F (36.8 C) 98 F (36.7 C) 98.7 F (37.1 C) 98.7 F (37.1 C)  TempSrc: Axillary  Oral Oral  SpO2: 94% 96%  99%  Weight:      Height:        Intake/Output Summary (Last 24 hours) at 08/12/2019 1803 Last data filed at 08/12/2019 1300 Gross per  24 hour  Intake 930 ml  Output 50 ml  Net 880 ml   Filed Weights   08/08/19 1631 08/10/19 0500  Weight: 80.5 kg 81.8 kg    Exam:  General: NAD, pleasant, HOH  Cardiovascular: S1, S2 present  Respiratory: CTAB  Abdomen: Soft, nontender, nondistended, bowel sounds present  Musculoskeletal: No bilateral pedal edema noted  Skin: Normal  Psychiatry: Normal mood   Data  Reviewed: CBC: Recent Labs  Lab 08/08/19 1632 08/09/19 0436 08/10/19 0429 08/11/19 0520 08/12/19 0525 08/12/19 1256  WBC 10.0 8.5 9.4 9.0 8.6 8.2  NEUTROABS 5.6  --  6.3 5.6 5.1  --   HGB 12.3* 12.3* 11.8* 11.6* 11.6* 11.8*  HCT 41.1 41.6 40.2 39.4 39.5 41.1  MCV 96.5 96.3 95.7 95.6 95.4 97.9  PLT 326 312 315 299 326 563   Basic Metabolic Panel: Recent Labs  Lab 08/08/19 1632 08/09/19 0436 08/10/19 0429 08/11/19 0520  NA 139 141 144 142  K 4.0 4.0 3.8 3.7  CL 104 109 110 108  CO2 24 25 27 26   GLUCOSE 86 100* 96 104*  BUN 22 20 24* 30*  CREATININE 1.61* 1.62* 1.58* 1.75*  CALCIUM 8.7* 8.5* 8.3* 8.8*   GFR: Estimated Creatinine Clearance: 28.2 mL/min (A) (by C-G formula based on SCr of 1.75 mg/dL (H)). Liver Function Tests: Recent Labs  Lab 08/08/19 1632 08/09/19 0436  AST 17 12*  ALT 12 9  ALKPHOS 73 68  BILITOT 0.5 0.5  PROT 6.1* 5.5*  ALBUMIN 3.1* 2.7*   No results for input(s): LIPASE, AMYLASE in the last 168 hours. No results for input(s): AMMONIA in the last 168 hours. Coagulation Profile: No results for input(s): INR, PROTIME in the last 168 hours. Cardiac Enzymes: No results for input(s): CKTOTAL, CKMB, CKMBINDEX, TROPONINI in the last 168 hours. BNP (last 3 results) No results for input(s): PROBNP in the last 8760 hours. HbA1C: No results for input(s): HGBA1C in the last 72 hours. CBG: No results for input(s): GLUCAP in the last 168 hours. Lipid Profile: No results for input(s): CHOL, HDL, LDLCALC, TRIG, CHOLHDL, LDLDIRECT in the last 72 hours. Thyroid Function Tests: No results for input(s): TSH, T4TOTAL, FREET4, T3FREE, THYROIDAB in the last 72 hours. Anemia Panel: No results for input(s): VITAMINB12, FOLATE, FERRITIN, TIBC, IRON, RETICCTPCT in the last 72 hours. Urine analysis:    Component Value Date/Time   COLORURINE YELLOW 08/08/2019 1739   APPEARANCEUR TURBID (A) 08/08/2019 1739   LABSPEC 1.009 08/08/2019 1739   PHURINE 5.0  08/08/2019 1739   GLUCOSEU NEGATIVE 08/08/2019 1739   HGBUR LARGE (A) 08/08/2019 1739   BILIRUBINUR NEGATIVE 08/08/2019 1739   KETONESUR NEGATIVE 08/08/2019 1739   PROTEINUR 100 (A) 08/08/2019 1739   UROBILINOGEN 1.0 02/28/2015 1550   NITRITE NEGATIVE 08/08/2019 1739   LEUKOCYTESUR LARGE (A) 08/08/2019 1739   Sepsis Labs: @LABRCNTIP (procalcitonin:4,lacticidven:4)  ) Recent Results (from the past 240 hour(s))  Blood Culture (routine x 2)     Status: None (Preliminary result)   Collection Time: 08/08/19  4:58 PM   Specimen: BLOOD  Result Value Ref Range Status   Specimen Description   Final    BLOOD RIGHT ANTECUBITAL Performed at [facility], [place] [address], [place], [place] [address]    Special Requests   Final    BOTTLES DRAWN AEROBIC AND ANAEROBIC Blood Culture adequate volume Performed at [facility] [address], [place], [place] [address]    Culture   Final  NO GROWTH 4 DAYS Performed at [facility], [place] [address], [place], [place] [address]    Report Status PENDING  Incomplete  Blood Culture (routine x 2)     Status: None (Preliminary result)   Collection Time: 08/08/19  4:58 PM   Specimen: BLOOD  Result Value Ref Range Status   Specimen Description   Final    BLOOD LEFT ANTECUBITAL Performed at [facility], [place] [address], [place], [place] [address]    Special Requests   Final    BOTTLES DRAWN AEROBIC AND ANAEROBIC Blood Culture adequate volume Performed at [facility] [address] [place], [place] [address]    Culture   Final    NO GROWTH 4 DAYS Performed at [facility], [place] [address], [place], [place] [address]    Report Status PENDING  Incomplete  Urine culture     Status: Abnormal   Collection Time: 08/08/19  5:39 PM   Specimen: Urine, Clean Catch  Result Value Ref Range Status   Specimen Description URINE, CLEAN CATCH  Final   Special Requests NONE  Final   Culture 50,000  COLONIES/mL YEAST (A)  Final   Report Status 08/10/2019 FINAL  Final  SARS CORONAVIRUS 2 (TAT 6-24 HRS) Nasopharyngeal Nasopharyngeal Swab     Status: None   Collection Time: 08/08/19  5:48 PM   Specimen: Nasopharyngeal Swab  Result Value Ref Range Status   SARS Coronavirus 2 NEGATIVE NEGATIVE Final    Comment: (NOTE) SARS-CoV-2 target nucleic acids are NOT DETECTED. The SARS-CoV-2 RNA is generally detectable in upper and lower respiratory specimens during the acute phase of infection. Negative results do not preclude SARS-CoV-2 infection, do not rule out co-infections with other pathogens, and should not be used as the sole basis for treatment or other patient management decisions. Negative results must be combined with clinical observations, patient history, and epidemiological information. The expected result is Negative. Fact Sheet for Patients: SugarRoll.be Fact Sheet for Healthcare Providers: https://www.woods-mathews.com/ This test is not yet approved or cleared by the [place] FDA and  has been authorized for detection and/or diagnosis of SARS-CoV-2 by FDA under an Emergency Use Authorization (EUA). This EUA will remain  in effect (meaning this test can be used) for the duration of the COVID-19 declaration under Section 56 4(b)(1) of the Act, 21 U.S.C. section 360bbb-3(b)(1), unless the authorization is terminated or revoked sooner. Performed at [facility], [place] [address] [place], [place] [address]       Studies: US RENAL  Result Date: 08/12/2019 CLINICAL DATA:  Hematuria. EXAM: RENAL / URINARY TRACT ULTRASOUND COMPLETE COMPARISON:  Renal ultrasound 05/01/2019. Abdominopelvic CT 04/21/2019. FINDINGS: Right Kidney: Renal measurements: 8.1 x 5.0 x 5.8 cm = volume: 124.1 mL. There is renal cortical thinning without focal abnormality. Moderate dilatation of the right renal pelvis and mild caliectasis, similar to previous  studies. Left Kidney: Renal measurements: 9.6 x 5.5 x 4.3 cm = volume: 117.7 mL. Cortical thinning and prominent renal sinus fat. No hydronephrosis or focal cortical lesion identified. Bladder: There is a large heterogeneous mass within the urinary bladder, measuring up to 7.6 cm in diameter. This demonstrates echogenic and hypoechoic components, but no obvious blood flow on limited Doppler evaluation. There are no shadowing components to correspond with the calculus seen on previous studies. Other: None. IMPRESSION: 1. Heterogeneous mass within the urinary bladder shows no obvious internal blood flow and could reflect blood clot. The bladder calculi  demonstrated previously are not clearly visualized. Cystoscopy warranted to exclude neoplasm if not recently performed. 2. Right extrarenal pelvis and mild hydronephrosis, similar to previous studies. 3. Bilateral renal cortical thinning. No focal cortical lesions visualized by ultrasound. Electronically Signed   By: Richardean Sale M.D.   On: 08/12/2019 12:54    Scheduled Meds: . Chlorhexidine Gluconate Cloth  6 each Topical Daily  . cholecalciferol  5,000 Units Oral Daily  . feeding supplement (PRO-STAT SUGAR FREE 64)  30 mL Oral BID  . finasteride  5 mg Oral QHS  . mouth rinse  15 mL Mouth Rinse BID  . multivitamin with minerals  1 tablet Oral Daily  . simvastatin  10 mg Oral QHS  . vitamin B-12  5,000 mcg Oral Daily    Continuous Infusions: . sodium chloride 250 mL (08/09/19 1958)     LOS: 3 days     Alma Friendly, MD [facility]  If 7PM-7AM, please contact night-coverage [email or web address] 08/12/2019, 6:03 PM

## 2019-08-12 NOTE — Progress Notes (Signed)
Pt has voided incontinently Urine noted to be bright red No clots were seen at this time. No other acute changes noted VSS

## 2019-08-13 LAB — BASIC METABOLIC PANEL
Anion gap: 7 (ref 5–15)
BUN: 39 mg/dL — ABNORMAL HIGH (ref 8–23)
CO2: 29 mmol/L (ref 22–32)
Calcium: 9.2 mg/dL (ref 8.9–10.3)
Chloride: 105 mmol/L (ref 98–111)
Creatinine, Ser: 1.78 mg/dL — ABNORMAL HIGH (ref 0.61–1.24)
GFR calc Af Amer: 39 mL/min — ABNORMAL LOW (ref >60)
GFR calc non Af Amer: 33 mL/min — ABNORMAL LOW (ref >60)
Glucose, Bld: 109 mg/dL — ABNORMAL HIGH (ref 70–99)
Potassium: 3.7 mmol/L (ref 3.5–5.1)
Sodium: 141 mmol/L (ref 135–145)

## 2019-08-13 LAB — CBC WITH DIFFERENTIAL/PLATELET
Abs Immature Granulocytes: 0.02 10*3/uL (ref 0.00–0.07)
Basophils Absolute: 0 10*3/uL (ref 0.0–0.1)
Basophils Relative: 0 %
Eosinophils Absolute: 0.7 10*3/uL — ABNORMAL HIGH (ref 0.0–0.5)
Eosinophils Relative: 8 %
HCT: 38.7 % — ABNORMAL LOW (ref 39.0–52.0)
Hemoglobin: 11.4 g/dL — ABNORMAL LOW (ref 13.0–17.0)
Immature Granulocytes: 0 %
Lymphocytes Relative: 21 %
Lymphs Abs: 1.9 10*3/uL (ref 0.7–4.0)
MCH: 28.1 pg (ref 26.0–34.0)
MCHC: 29.5 g/dL — ABNORMAL LOW (ref 30.0–36.0)
MCV: 95.3 fL (ref 80.0–100.0)
Monocytes Absolute: 0.6 10*3/uL (ref 0.1–1.0)
Monocytes Relative: 6 %
Neutro Abs: 5.8 10*3/uL (ref 1.7–7.7)
Neutrophils Relative %: 65 %
Platelets: 322 10*3/uL (ref 150–400)
RBC: 4.06 MIL/uL — ABNORMAL LOW (ref 4.22–5.81)
RDW: 14.5 % (ref 11.5–15.5)
WBC: 9 10*3/uL (ref 4.0–10.5)
nRBC: 0 % (ref 0.0–0.2)

## 2019-08-13 LAB — CULTURE, BLOOD (ROUTINE X 2)
Culture: NO GROWTH
Culture: NO GROWTH
Special Requests: ADEQUATE
Special Requests: ADEQUATE

## 2019-08-13 MED ORDER — FLUCONAZOLE 100 MG PO TABS
100.0000 mg | ORAL_TABLET | Freq: Every day | ORAL | Status: DC
  Administered 2019-08-13 – 2019-08-15 (×3): 100 mg via ORAL
  Filled 2019-08-13 (×3): qty 1

## 2019-08-13 NOTE — Progress Notes (Signed)
PROGRESS NOTE  Barry Swanson OHY:[id number] DOB: Jul 16, 1929 DOA: 08/08/2019 PCP: [name], MD  HPI/Recap of past 24 hours: HPI from [name]  is a 85 y.o. male,  w hypertension, hyperlipidemia, h/o stroke, schatski ring s/p esophageal dilation on July 19, 2019 whose wife apparently died of covid-19 on 08/16/23 presents with " can't keep food down, and temp 100.  Pt was apparently brought from his pcp office to ER for reflux of food as well as lower extremity swelling. Pt also noted to have some AMS. In the ED, VSS. UA positive for WBC>50, rbc>50. Pt admitted for AMS 2/2 to UTI.  Patient is a very poor historian.    Today, patient denies any new complaints.  Denies any pain.  No further hematuria noted since Eliquis was held.   Assessment/Plan: Active Problems:   AMS (altered mental status)  Acute metabolic encephalopathy Mentation improving, not at baseline, very poor historian (HOH) Unknown etiology, ??Cognitive impairment/underlying dementia TSH, B12, all within normal limits, RPR nonreactive CT head with no acute intracranial abnormalities Monitor closely  ??UTI Low-grade temp admission, no leukocytosis UA with WBC greater than 50, RBC greater than 50 UC growing 50,000 yeast Urology on board, rec treating with Diflucan 100 mg daily for 5 days  LLE acute DVT Doppler showed DVT noted in the left gastrocnemius veins Hold apixaban for now due to gross hematuria, spoke to urology Dr. Alyson Ingles on 12/27, plan to restart Eliquis on 08/14/19 BNP 197.5 Echo showed EF of 55 to 94%, grade 1 diastolic dysfunction  Gross hematuria Noted to have multiple episodes of gross hematuria, with some passage of clots noted History of bladder stone status post cystoscopy and removal by urology, as well as enlarged prostate Renal ultrasound done showed heterogeneous mass within the urinary bladder with mild hydronephrosis unchanged from previous studies.  Cystoscopy warranted Urology  consulted, spoke to urology Dr. Alyson Ingles on 12/27, plan to restart Eliquis on 08/14/19 Continue to hold Eliquis for now  Dysphagia likely 2/2 Schatzki ring Status post esophageal dilatation on Jul 19, 2019, by Dr. Maryann Alar to have trouble swallowing, with chronic cough Chest x-ray with bibasilar atelectasis, no acute cardiopulmonary disease SLP on board, mild aspiration risk, recommend dysphagia type III diet Outpt follow up with GI  CKD stage IIIb Creatinine stable at baseline Daily BMP  H/O CVA/hyperlipidemia Continue simvastatin, hold ASA for now  BPH Continue Proscar        Malnutrition Type:      Malnutrition Characteristics:      Nutrition Interventions:       Estimated body mass index is 28.12 kg/m as calculated from the following:   Height as of this encounter: 5\' 8"  (1.727 m).   Weight as of this encounter: 83.9 kg.     Code Status: Full  Family Communication: None at bedside  Disposition Plan: Likely home once stable   Consultants:  Urology  Procedures:  None  Antimicrobials:  Diflucan  DVT prophylaxis: SCDs for now, hold Eliquis   Objective: Vitals:   08/12/19 2055 08/13/19 0526 08/13/19 0536 08/13/19 1442  BP: 132/78 133/84  (!) 148/84  Pulse: 72 68  67  Resp: 16 18  16   Temp: 98.1 F (36.7 C) (!) 97.5 F (36.4 C)  (!) 97.4 F (36.3 C)  TempSrc: Oral Oral  Oral  SpO2: 98% 99%  94%  Weight:   83.9 kg   Height:        Intake/Output Summary (Last 24 hours) at 08/13/2019 1521 Last data  filed at 08/13/2019 1000 Gross per 24 hour  Intake 260 ml  Output --  Net 260 ml   Filed Weights   08/08/19 1631 08/10/19 0500 08/13/19 0536  Weight: 80.5 kg 81.8 kg 83.9 kg    Exam:  General: NAD, HOH   Cardiovascular: S1, S2 present  Respiratory: CTAB  Abdomen: Soft, nontender, nondistended, bowel sounds present  Musculoskeletal: No bilateral pedal edema noted  Skin: Normal  Psychiatry: Normal mood   Data  Reviewed: CBC: Recent Labs  Lab 08/08/19 1632 08/10/19 0429 08/11/19 0520 08/12/19 0525 08/12/19 1256 08/12/19 2100 08/13/19 0548  WBC 10.0 9.4 9.0 8.6 8.2 10.7* 9.0  NEUTROABS 5.6 6.3 5.6 5.1  --   --  5.8  HGB 12.3* 11.8* 11.6* 11.6* 11.8* 12.1* 11.4*  HCT 41.1 40.2 39.4 39.5 41.1 40.5 38.7*  MCV 96.5 95.7 95.6 95.4 97.9 94.8 95.3  PLT 326 315 299 326 340 330 413   Basic Metabolic Panel: Recent Labs  Lab 08/08/19 1632 08/09/19 0436 08/10/19 0429 08/11/19 0520 08/13/19 0548  NA 139 141 144 142 141  K 4.0 4.0 3.8 3.7 3.7  CL 104 109 110 108 105  CO2 24 25 27 26 29   GLUCOSE 86 100* 96 104* 109*  BUN 22 20 24* 30* 39*  CREATININE 1.61* 1.62* 1.58* 1.75* 1.78*  CALCIUM 8.7* 8.5* 8.3* 8.8* 9.2   GFR: Estimated Creatinine Clearance: 30.3 mL/min (A) (by C-G formula based on SCr of 1.78 mg/dL (H)). Liver Function Tests: Recent Labs  Lab 08/08/19 1632 08/09/19 0436  AST 17 12*  ALT 12 9  ALKPHOS 73 68  BILITOT 0.5 0.5  PROT 6.1* 5.5*  ALBUMIN 3.1* 2.7*   No results for input(s): LIPASE, AMYLASE in the last 168 hours. No results for input(s): AMMONIA in the last 168 hours. Coagulation Profile: No results for input(s): INR, PROTIME in the last 168 hours. Cardiac Enzymes: No results for input(s): CKTOTAL, CKMB, CKMBINDEX, TROPONINI in the last 168 hours. BNP (last 3 results) No results for input(s): PROBNP in the last 8760 hours. HbA1C: No results for input(s): HGBA1C in the last 72 hours. CBG: No results for input(s): GLUCAP in the last 168 hours. Lipid Profile: No results for input(s): CHOL, HDL, LDLCALC, TRIG, CHOLHDL, LDLDIRECT in the last 72 hours. Thyroid Function Tests: No results for input(s): TSH, T4TOTAL, FREET4, T3FREE, THYROIDAB in the last 72 hours. Anemia Panel: No results for input(s): VITAMINB12, FOLATE, FERRITIN, TIBC, IRON, RETICCTPCT in the last 72 hours. Urine analysis:    Component Value Date/Time   COLORURINE YELLOW 08/08/2019 1739    APPEARANCEUR TURBID (A) 08/08/2019 1739   LABSPEC 1.009 08/08/2019 1739   PHURINE 5.0 08/08/2019 1739   GLUCOSEU NEGATIVE 08/08/2019 1739   HGBUR LARGE (A) 08/08/2019 1739   BILIRUBINUR NEGATIVE 08/08/2019 1739   KETONESUR NEGATIVE 08/08/2019 1739   PROTEINUR 100 (A) 08/08/2019 1739   UROBILINOGEN 1.0 02/28/2015 1550   NITRITE NEGATIVE 08/08/2019 1739   LEUKOCYTESUR LARGE (A) 08/08/2019 1739   Sepsis Labs: @LABRCNTIP (procalcitonin:4,lacticidven:4)  ) Recent Results (from the past 240 hour(s))  Blood Culture (routine x 2)     Status: None   Collection Time: 08/08/19  4:58 PM   Specimen: BLOOD  Result Value Ref Range Status   Specimen Description   Final    BLOOD RIGHT ANTECUBITAL Performed at [facility], [place] [address], [place], [place] [address]    Special Requests   Final    BOTTLES DRAWN AEROBIC AND ANAEROBIC Blood Culture  adequate volume Performed at [facility] [address], [place], [place] [address]    Culture   Final    NO GROWTH 5 DAYS Performed at [facility], [place] [address], [place], [place] [address]    Report Status 08/13/2019 FINAL  Final  Blood Culture (routine x 2)     Status: None   Collection Time: 08/08/19  4:58 PM   Specimen: BLOOD  Result Value Ref Range Status   Specimen Description   Final    BLOOD LEFT ANTECUBITAL Performed at [facility], [place] [address] [place], [place] [address]    Special Requests   Final    BOTTLES DRAWN AEROBIC AND ANAEROBIC Blood Culture adequate volume Performed at [facility] [address] [place], [place] [address]    Culture   Final    NO GROWTH 5 DAYS Performed at [facility], [place] [address] [place], [place] [address]    Report Status 08/13/2019 FINAL  Final  Urine culture     Status: Abnormal   Collection Time: 08/08/19  5:39 PM   Specimen: Urine, Clean Catch  Result Value Ref Range Status   Specimen Description URINE, CLEAN CATCH   Final   Special Requests NONE  Final   Culture 50,000 COLONIES/mL YEAST (A)  Final   Report Status 08/10/2019 FINAL  Final  SARS CORONAVIRUS 2 (TAT 6-24 HRS) Nasopharyngeal Nasopharyngeal Swab     Status: None   Collection Time: 08/08/19  5:48 PM   Specimen: Nasopharyngeal Swab  Result Value Ref Range Status   SARS Coronavirus 2 NEGATIVE NEGATIVE Final    Comment: (NOTE) SARS-CoV-2 target nucleic acids are NOT DETECTED. The SARS-CoV-2 RNA is generally detectable in upper and lower respiratory specimens during the acute phase of infection. Negative results do not preclude SARS-CoV-2 infection, do not rule out co-infections with other pathogens, and should not be used as the sole basis for treatment or other patient management decisions. Negative results must be combined with clinical observations, patient history, and epidemiological information. The expected result is Negative. Fact Sheet for Patients: SugarRoll.be Fact Sheet for Healthcare Providers: https://www.woods-mathews.com/ This test is not yet approved or cleared by the [place] FDA and  has been authorized for detection and/or diagnosis of SARS-CoV-2 by FDA under an Emergency Use Authorization (EUA). This EUA will remain  in effect (meaning this test can be used) for the duration of the COVID-19 declaration under Section 56 4(b)(1) of the Act, 21 U.S.C. section 360bbb-3(b)(1), unless the authorization is terminated or revoked sooner. Performed at [facility], [place] [address], [place], [place] [address]       Studies: No results found.  Scheduled Meds: . Chlorhexidine Gluconate Cloth  6 each Topical Daily  . cholecalciferol  5,000 Units Oral Daily  . feeding supplement (PRO-STAT SUGAR FREE 64)  30 mL Oral BID  . finasteride  5 mg Oral QHS  . mouth rinse  15 mL Mouth Rinse BID  . multivitamin with minerals  1 tablet Oral Daily  . simvastatin  10 mg Oral QHS  .  vitamin B-12  5,000 mcg Oral Daily    Continuous Infusions: . sodium chloride 250 mL (08/09/19 1958)     LOS: 4 days     Alma Friendly, MD [facility]  If 7PM-7AM, please contact night-coverage [email or web address] 08/13/2019, 3:21 PM

## 2019-08-13 NOTE — Consult Note (Signed)
Urology Consult  Referring physician: Dr. Horris Latino Reason for referral: Gross hematuria  Chief Complaint: Gross hematuria  History of Present Illness: Barry Swanson is a 85yo with a hx of BPH admitted with altered mental statis secondary to UTI. He was started on lovenox and was noted to develop gross hematuria with passage of clot. He has a hx of BPH and underwent cystolithalopaxy on 06/01/2019 with Dr. Louis Meckel. No hx of recurrent UTI. He has severe LUTS at baseline. No previous episodes of gross hematuria. No suprapubic pain. He underwent renal US which showed a 7cm clot in the bladder. The patient is currently emptying his bladder well. No other associated symptoms. No exacerbating/alleviaitng events. Urine is currently clear  Past Medical History:  Diagnosis Date  . Hepatitis B   . HOH (hard of hearing)   . Hyperlipidemia   . Hypertension   . Stroke ([place])   . UTI (urinary tract infection)    Past Surgical History:  Procedure Laterality Date  . BALLOON DILATION N/A 06/29/2019   Procedure: BALLOON DILATION;  Surgeon: Wonda Horner, MD;  Location: [facility];  Service: Endoscopy;  Laterality: N/A;  . BLADDER STONE REMOVAL     blasted  . CATARACT EXTRACTION W/ INTRAOCULAR LENS  IMPLANT, BILATERAL  2004  . CYSTOSCOPY WITH LITHOLAPAXY N/A 06/01/2019   Procedure: CYSTOSCOPY WITH LITHOLAPAXY;  Surgeon: Ardis Hughs, MD;  Location: [facility];  Service: Urology;  Laterality: N/A;  . ESOPHAGOGASTRODUODENOSCOPY (EGD) WITH PROPOFOL N/A 06/29/2019   Procedure: ESOPHAGOGASTRODUODENOSCOPY (EGD) WITH PROPOFOL WITH POSSIBLE DIL;  Surgeon: Wonda Horner, MD;  Location: [facility];  Service: Endoscopy;  Laterality: N/A;  . PROSTATE SURGERY     trimmed prostate away from urethral tract    Medications: I have reviewed the patient's current medications. Allergies: No Known Allergies  Family History  Problem Relation Age of Onset  . Heart disease Mother   . Heart disease Father   .  Parkinson's disease Sister   . Lung cancer Brother   . Leukemia Brother    Social History:  reports that he quit smoking about 21 years ago. His smoking use included cigarettes. He smoked 2.00 packs per day. His smokeless tobacco use includes chew. He reports previous alcohol use. He reports that he does not use drugs.  Review of Systems  Constitutional: Positive for fatigue.  Respiratory: Positive for shortness of breath.   Genitourinary: Positive for frequency, hematuria and urgency.  All other systems reviewed and are negative.   Physical Exam:  Vital signs in last 24 hours: Temp:  [97.5 F (36.4 C)-98.1 F (36.7 C)] 97.5 F (36.4 C) (12/27 0526) Pulse Rate:  [68-72] 68 (12/27 0526) Resp:  [16-18] 18 (12/27 0526) BP: (132-133)/(78-84) 133/84 (12/27 0526) SpO2:  [98 %-99 %] 99 % (12/27 0526) Weight:  [83.9 kg] 83.9 kg (12/27 0536) Physical Exam  Constitutional: He is oriented to person, place, and time. He appears well-developed and well-nourished.  HENT:  Head: Normocephalic and atraumatic.  Eyes: Pupils are equal, round, and reactive to light. EOM are normal.  Neck: No thyromegaly present.  Cardiovascular: Normal rate and regular rhythm.  Respiratory: Effort normal. No respiratory distress.  GI: Soft. He exhibits no distension. Hernia confirmed negative in the right inguinal area and confirmed negative in the left inguinal area.  Genitourinary:    Testes and penis normal.   Musculoskeletal:        General: No edema. Normal range of motion.     Cervical back: Normal range  of motion.  Lymphadenopathy:       Right: No inguinal adenopathy present.       Left: No inguinal adenopathy present.  Neurological: He is alert and oriented to person, place, and time.  Skin: Skin is warm and dry.  Psychiatric: He has a normal mood and affect. His behavior is normal. Judgment and thought content normal.    Laboratory Data:  Results for orders placed or performed during the hospital  encounter of 08/08/19 (from the past 72 hour(s))  CBC with Differential/Platelet     Status: Abnormal   Collection Time: 08/11/19  5:20 AM  Result Value Ref Range   WBC 9.0 4.0 - 10.5 K/uL   RBC 4.12 (L) 4.22 - 5.81 MIL/uL   Hemoglobin 11.6 (L) 13.0 - 17.0 g/dL   HCT 39.4 39.0 - 52.0 %   MCV 95.6 80.0 - 100.0 fL   MCH 28.2 26.0 - 34.0 pg   MCHC 29.4 (L) 30.0 - 36.0 g/dL   RDW 14.6 11.5 - 15.5 %   Platelets 299 150 - 400 K/uL   nRBC 0.0 0.0 - 0.2 %   Neutrophils Relative % 62 %   Neutro Abs 5.6 1.7 - 7.7 K/uL   Lymphocytes Relative 23 %   Lymphs Abs 2.1 0.7 - 4.0 K/uL   Monocytes Relative 7 %   Monocytes Absolute 0.6 0.1 - 1.0 K/uL   Eosinophils Relative 7 %   Eosinophils Absolute 0.6 (H) 0.0 - 0.5 K/uL   Basophils Relative 1 %   Basophils Absolute 0.1 0.0 - 0.1 K/uL   Immature Granulocytes 0 %   Abs Immature Granulocytes 0.03 0.00 - 0.07 K/uL    Comment: Performed at [facility], [place] [address], [place], [place] [address]  Basic metabolic panel     Status: Abnormal   Collection Time: 08/11/19  5:20 AM  Result Value Ref Range   Sodium 142 135 - 145 mmol/L   Potassium 3.7 3.5 - 5.1 mmol/L   Chloride 108 98 - 111 mmol/L   CO2 26 22 - 32 mmol/L   Glucose, Bld 104 (H) 70 - 99 mg/dL   BUN 30 (H) 8 - 23 mg/dL   Creatinine, Ser 1.75 (H) 0.61 - 1.24 mg/dL   Calcium 8.8 (L) 8.9 - 10.3 mg/dL   GFR calc non Af Amer 34 (L) >60 mL/min   GFR calc Af Amer 39 (L) >60 mL/min   Anion gap 8 5 - 15    Comment: Performed at [facility] [place] [address] [place], [place] [address]  CBC with Differential/Platelet     Status: Abnormal   Collection Time: 08/12/19  5:25 AM  Result Value Ref Range   WBC 8.6 4.0 - 10.5 K/uL   RBC 4.14 (L) 4.22 - 5.81 MIL/uL   Hemoglobin 11.6 (L) 13.0 - 17.0 g/dL   HCT 39.5 39.0 - 52.0 %   MCV 95.4 80.0 - 100.0 fL   MCH 28.0 26.0 - 34.0 pg   MCHC 29.4 (L) 30.0 - 36.0 g/dL   RDW 14.5 11.5 - 15.5 %   Platelets 326 150 - 400  K/uL   nRBC 0.0 0.0 - 0.2 %   Neutrophils Relative % 58 %   Neutro Abs 5.1 1.7 - 7.7 K/uL   Lymphocytes Relative 26 %   Lymphs Abs 2.2 0.7 - 4.0 K/uL   Monocytes Relative 6 %   Monocytes Absolute 0.5 0.1 - 1.0 K/uL   Eosinophils Relative 8 %  Eosinophils Absolute 0.6 (H) 0.0 - 0.5 K/uL   Basophils Relative 1 %   Basophils Absolute 0.0 0.0 - 0.1 K/uL   Immature Granulocytes 1 %   Abs Immature Granulocytes 0.04 0.00 - 0.07 K/uL    Comment: Performed at [facility], [place] [address], [place], [place] [address]  CBC     Status: Abnormal   Collection Time: 08/12/19 12:56 PM  Result Value Ref Range   WBC 8.2 4.0 - 10.5 K/uL   RBC 4.20 (L) 4.22 - 5.81 MIL/uL   Hemoglobin 11.8 (L) 13.0 - 17.0 g/dL   HCT 41.1 39.0 - 52.0 %   MCV 97.9 80.0 - 100.0 fL   MCH 28.1 26.0 - 34.0 pg   MCHC 28.7 (L) 30.0 - 36.0 g/dL   RDW 14.6 11.5 - 15.5 %   Platelets 340 150 - 400 K/uL   nRBC 0.0 0.0 - 0.2 %    Comment: Performed at [facility], [place] [address], [place], [place] [address]  CBC     Status: Abnormal   Collection Time: 08/12/19  9:00 PM  Result Value Ref Range   WBC 10.7 (H) 4.0 - 10.5 K/uL   RBC 4.27 4.22 - 5.81 MIL/uL   Hemoglobin 12.1 (L) 13.0 - 17.0 g/dL   HCT 40.5 39.0 - 52.0 %   MCV 94.8 80.0 - 100.0 fL   MCH 28.3 26.0 - 34.0 pg   MCHC 29.9 (L) 30.0 - 36.0 g/dL   RDW 14.5 11.5 - 15.5 %   Platelets 330 150 - 400 K/uL   nRBC 0.0 0.0 - 0.2 %    Comment: Performed at [facility], [place] [address] [place], [place] [address]  CBC with Differential/Platelet     Status: Abnormal   Collection Time: 08/13/19  5:48 AM  Result Value Ref Range   WBC 9.0 4.0 - 10.5 K/uL   RBC 4.06 (L) 4.22 - 5.81 MIL/uL   Hemoglobin 11.4 (L) 13.0 - 17.0 g/dL   HCT 38.7 (L) 39.0 - 52.0 %   MCV 95.3 80.0 - 100.0 fL   MCH 28.1 26.0 - 34.0 pg   MCHC 29.5 (L) 30.0 - 36.0 g/dL   RDW 14.5 11.5 - 15.5 %   Platelets 322 150 - 400 K/uL   nRBC 0.0 0.0 - 0.2 %    Neutrophils Relative % 65 %   Neutro Abs 5.8 1.7 - 7.7 K/uL   Lymphocytes Relative 21 %   Lymphs Abs 1.9 0.7 - 4.0 K/uL   Monocytes Relative 6 %   Monocytes Absolute 0.6 0.1 - 1.0 K/uL   Eosinophils Relative 8 %   Eosinophils Absolute 0.7 (H) 0.0 - 0.5 K/uL   Basophils Relative 0 %   Basophils Absolute 0.0 0.0 - 0.1 K/uL   Immature Granulocytes 0 %   Abs Immature Granulocytes 0.02 0.00 - 0.07 K/uL    Comment: Performed at [facility], [place] [address], [place], [place] [address]  Basic metabolic panel     Status: Abnormal   Collection Time: 08/13/19  5:48 AM  Result Value Ref Range   Sodium 141 135 - 145 mmol/L   Potassium 3.7 3.5 - 5.1 mmol/L   Chloride 105 98 - 111 mmol/L   CO2 29 22 - 32 mmol/L   Glucose, Bld 109 (H) 70 - 99 mg/dL   BUN 39 (H) 8 - 23 mg/dL   Creatinine, Ser 1.78 (H) 0.61 - 1.24 mg/dL  Calcium 9.2 8.9 - 10.3 mg/dL   GFR calc non Af Amer 33 (L) >60 mL/min   GFR calc Af Amer 39 (L) >60 mL/min   Anion gap 7 5 - 15    Comment: Performed at [facility], [place] [address], [place], [place] [address]   Recent Results (from the past 240 hour(s))  Blood Culture (routine x 2)     Status: None   Collection Time: 08/08/19  4:58 PM   Specimen: BLOOD  Result Value Ref Range Status   Specimen Description   Final    BLOOD RIGHT ANTECUBITAL Performed at [facility], [place] [address], [place], [place] [address]    Special Requests   Final    BOTTLES DRAWN AEROBIC AND ANAEROBIC Blood Culture adequate volume Performed at [facility] [address], [place], [place] [address]    Culture   Final    NO GROWTH 5 DAYS Performed at [facility], [place] [address], [place], [place] [address]    Report Status 08/13/2019 FINAL  Final  Blood Culture (routine x 2)     Status: None   Collection Time: 08/08/19  4:58 PM   Specimen: BLOOD  Result Value Ref Range Status   Specimen Description   Final    BLOOD LEFT  ANTECUBITAL Performed at [facility], [place] [address] [place], [place] [address]    Special Requests   Final    BOTTLES DRAWN AEROBIC AND ANAEROBIC Blood Culture adequate volume Performed at [facility] [address] [place], [place] [address]    Culture   Final    NO GROWTH 5 DAYS Performed at [facility], [place] [address], [place], [place] [address]    Report Status 08/13/2019 FINAL  Final  Urine culture     Status: Abnormal   Collection Time: 08/08/19  5:39 PM   Specimen: Urine, Clean Catch  Result Value Ref Range Status   Specimen Description URINE, CLEAN CATCH  Final   Special Requests NONE  Final   Culture 50,000 COLONIES/mL YEAST (A)  Final   Report Status 08/10/2019 FINAL  Final  SARS CORONAVIRUS 2 (TAT 6-24 HRS) Nasopharyngeal Nasopharyngeal Swab     Status: None   Collection Time: 08/08/19  5:48 PM   Specimen: Nasopharyngeal Swab  Result Value Ref Range Status   SARS Coronavirus 2 NEGATIVE NEGATIVE Final    Comment: (NOTE) SARS-CoV-2 target nucleic acids are NOT DETECTED. The SARS-CoV-2 RNA is generally detectable in upper and lower respiratory specimens during the acute phase of infection. Negative results do not preclude SARS-CoV-2 infection, do not rule out co-infections with other pathogens, and should not be used as the sole basis for treatment or other patient management decisions. Negative results must be combined with clinical observations, patient history, and epidemiological information. The expected result is Negative. Fact Sheet for Patients: SugarRoll.be Fact Sheet for Healthcare Providers: https://www.woods-mathews.com/ This test is not yet approved or cleared by the [place] FDA and  has been authorized for detection and/or diagnosis of SARS-CoV-2 by FDA under an Emergency Use Authorization (EUA). This EUA will remain  in effect (meaning this test can be used) for the  duration of the COVID-19 declaration under Section 56 4(b)(1) of the Act, 21 U.S.C. section 360bbb-3(b)(1), unless the authorization is terminated or revoked sooner. Performed at [facility], [place] [address], [place],  [address]    Creatinine: Recent Labs  08/08/19 1632 08/09/19 0436 08/10/19 0429 08/11/19 0520 08/13/19 0548  CREATININE 1.61* 1.62* 1.58* 1.75* 1.78*   Baseline Creatinine: 1.5  Impression/Assessment:  85yo with BPH, UTI and Gross hematuria  Plan:  1. BPH: Please continue finasteride 2. UTI: Urine culture positive for yeast. Please treated with diflucan 100mg  daily for 5 days 3. Gross hematuria: The patient's gross hematuria is likely related to his UTI. He had a cystoscopy in 05/2019 when his bladder calculus was removed and not tumor was noted at that time. Urine is clear at this time and I would recommend holding the lovenox which exacerbated the hematuria  Barry Swanson 08/13/2019, 1:40 PM

## 2019-08-14 DIAGNOSIS — R319 Hematuria, unspecified: Secondary | ICD-10-CM | POA: Diagnosis present

## 2019-08-14 DIAGNOSIS — G9340 Encephalopathy, unspecified: Secondary | ICD-10-CM

## 2019-08-14 DIAGNOSIS — R310 Gross hematuria: Secondary | ICD-10-CM

## 2019-08-14 DIAGNOSIS — I82462 Acute embolism and thrombosis of left calf muscular vein: Secondary | ICD-10-CM

## 2019-08-14 DIAGNOSIS — N390 Urinary tract infection, site not specified: Principal | ICD-10-CM

## 2019-08-14 DIAGNOSIS — N183 Chronic kidney disease, stage 3 unspecified: Secondary | ICD-10-CM | POA: Diagnosis present

## 2019-08-14 DIAGNOSIS — I82409 Acute embolism and thrombosis of unspecified deep veins of unspecified lower extremity: Secondary | ICD-10-CM | POA: Diagnosis present

## 2019-08-14 DIAGNOSIS — N1832 Chronic kidney disease, stage 3b: Secondary | ICD-10-CM

## 2019-08-14 DIAGNOSIS — R1310 Dysphagia, unspecified: Secondary | ICD-10-CM

## 2019-08-14 LAB — BASIC METABOLIC PANEL
Anion gap: 11 (ref 5–15)
BUN: 39 mg/dL — ABNORMAL HIGH (ref 8–23)
CO2: 26 mmol/L (ref 22–32)
Calcium: 9.2 mg/dL (ref 8.9–10.3)
Chloride: 107 mmol/L (ref 98–111)
Creatinine, Ser: 1.74 mg/dL — ABNORMAL HIGH (ref 0.61–1.24)
GFR calc Af Amer: 40 mL/min — ABNORMAL LOW (ref >60)
GFR calc non Af Amer: 34 mL/min — ABNORMAL LOW (ref >60)
Glucose, Bld: 103 mg/dL — ABNORMAL HIGH (ref 70–99)
Potassium: 3.9 mmol/L (ref 3.5–5.1)
Sodium: 144 mmol/L (ref 135–145)

## 2019-08-14 LAB — CBC WITH DIFFERENTIAL/PLATELET
Abs Immature Granulocytes: 0.02 10*3/uL (ref 0.00–0.07)
Basophils Absolute: 0.1 10*3/uL (ref 0.0–0.1)
Basophils Relative: 1 %
Eosinophils Absolute: 0.5 10*3/uL (ref 0.0–0.5)
Eosinophils Relative: 6 %
HCT: 40.1 % (ref 39.0–52.0)
Hemoglobin: 11.6 g/dL — ABNORMAL LOW (ref 13.0–17.0)
Immature Granulocytes: 0 %
Lymphocytes Relative: 26 %
Lymphs Abs: 2.3 10*3/uL (ref 0.7–4.0)
MCH: 28 pg (ref 26.0–34.0)
MCHC: 28.9 g/dL — ABNORMAL LOW (ref 30.0–36.0)
MCV: 96.6 fL (ref 80.0–100.0)
Monocytes Absolute: 0.5 10*3/uL (ref 0.1–1.0)
Monocytes Relative: 6 %
Neutro Abs: 5.4 10*3/uL (ref 1.7–7.7)
Neutrophils Relative %: 61 %
Platelets: 326 10*3/uL (ref 150–400)
RBC: 4.15 MIL/uL — ABNORMAL LOW (ref 4.22–5.81)
RDW: 14.4 % (ref 11.5–15.5)
WBC: 8.8 10*3/uL (ref 4.0–10.5)
nRBC: 0 % (ref 0.0–0.2)

## 2019-08-14 MED ORDER — APIXABAN 5 MG PO TABS: 10.0000 mg | ORAL_TABLET | Freq: Two times a day (BID) | ORAL | Status: DC

## 2019-08-14 MED ORDER — APIXABAN 5 MG PO TABS
10.0000 mg | ORAL_TABLET | Freq: Two times a day (BID) | ORAL | Status: DC
  Administered 2019-08-14 – 2019-08-15 (×2): 10 mg via ORAL
  Filled 2019-08-14 (×2): qty 2

## 2019-08-14 MED ORDER — APIXABAN 5 MG PO TABS: 5.0000 mg | ORAL_TABLET | Freq: Two times a day (BID) | ORAL | Status: DC

## 2019-08-14 NOTE — Progress Notes (Signed)
PROGRESS NOTE    Barry Swanson  WNI:[id number] DOB: Jan 26, 1929 DOA: 08/08/2019 PCP: [name], MD    Brief Narrative:  HPI from Dr Quintin Alto a89 y.o.male,w hypertension, hyperlipidemia, h/o stroke, schatski ring s/p esophageal dilation on 06/30/2019 whose wife apparently died of covid-19 on 07/28/2023 presents with " can't keep food down, and temp 100. Pt was apparently brought from his pcp office to ER for reflux of food as well as lower extremity swelling. Pt also noted to have some AMS. In the ED, VSS. UA positive for WBC>50, rbc>50. Pt admitted for AMS 2/2 to UTI.  Patient is a very poor historian.   Assessment & Plan:   Active Problems:   Acute encephalopathy   Hyperlipidemia   AMS (altered mental status)   Acute DVT (deep venous thrombosis) (HCC)   Hematuria   CKD (chronic kidney disease) stage 3, GFR 30-59 ml/min   Dysphagia   1. Acute metabolic encephalopathy.  Likely secondary to urinary tract infection and low-grade fever.  It appears that his overall mental status has improved.  He likely has some underlying cognitive impairment/dementia.  CT head was found to be unremarkable. 2. Urinary tract infection.  Low-grade temperature on admission.  Urinalysis with greater than 50 WBCs.  Urine culture growing 50,000 colonies of yeast.  Seen by urology who recommended 5 days of Diflucan. 3. Left lower extremity acute DVT.  This is being treated with Eliquis. 4. Gross hematuria.  Seen by urology who felt that this was secondary to his urinary tract infection.  He recently had cystoscopy done in 05/2019 that did not show any signs of bladder cancer.  It was recommended to hold anticoagulation until hematuria has resolved and resume on 12/28.  Currently hematuria has resolved so anticoagulation will be resumed.  Will need to monitor at least for 24 to 48 hours to ensure hematuria does not recur. 5. Dysphagia, likely secondary to Schatzki ring.  He is status post esophageal  dilatation in the past.  Seen by speech therapy who recommended dysphagia 3 diet.  Will need outpatient follow-up with GI. 6. Chronic kidney disease stage IIIb.  Creatinine stable. 7. BPH.  Continue Proscar.   DVT prophylaxis: Eliquis Code Status: Full code Family Communication: Discussed with daughter over the phone Disposition Plan: Discharge home in the next 24-48 hours if hematuria does not recur on anticoagulation.   Consultants:   Urology  Procedures:     Antimicrobials:   Diflucan   Subjective: Patient is sitting in chair.  He denies any pain.  No shortness of breath.  Objective: Vitals:   08/13/19 1442 08/13/19 2138 08/14/19 0454 08/14/19 1327  BP: (!) 148/84 129/79 (!) 141/88 113/76  Pulse: 67 67 66 70  Resp: 16 20 20  (!) 23  Temp: (!) 97.4 F (36.3 C) 98.1 F (36.7 C) 98.6 F (37 C) 98 F (36.7 C)  TempSrc: Oral Oral Oral Oral  SpO2: 94% 100% 97% 96%  Weight:   79.9 kg   Height:        Intake/Output Summary (Last 24 hours) at 08/14/2019 2007 Last data filed at 08/14/2019 1000 Gross per 24 hour  Intake 480 ml  Output 150 ml  Net 330 ml   Filed Weights   08/10/19 0500 08/13/19 0536 08/14/19 0454  Weight: 81.8 kg 83.9 kg 79.9 kg    Examination:  General exam: Appears calm and comfortable  Respiratory system: Clear to auscultation. Respiratory effort normal. Cardiovascular system: S1 & S2 heard, RRR. No JVD, murmurs,  rubs, gallops or clicks. No pedal edema. Gastrointestinal system: Abdomen is nondistended, soft and nontender. No organomegaly or masses felt. Normal bowel sounds heard. Central nervous system: No focal neurological deficits. Extremities: Symmetric 5 x 5 power. Skin: No rashes, lesions or ulcers Psychiatry: Pleasant, engages in conversation    Data Reviewed: I have personally reviewed following labs and imaging studies  CBC: Recent Labs  Lab 08/10/19 0429 08/11/19 0520 08/12/19 0525 08/12/19 1256 08/12/19 2100 08/13/19  0548 08/14/19 0406  WBC 9.4 9.0 8.6 8.2 10.7* 9.0 8.8  NEUTROABS 6.3 5.6 5.1  --   --  5.8 5.4  HGB 11.8* 11.6* 11.6* 11.8* 12.1* 11.4* 11.6*  HCT 40.2 39.4 39.5 41.1 40.5 38.7* 40.1  MCV 95.7 95.6 95.4 97.9 94.8 95.3 96.6  PLT 315 299 326 340 330 322 149   Basic Metabolic Panel: Recent Labs  Lab 08/09/19 0436 08/10/19 0429 08/11/19 0520 08/13/19 0548 08/14/19 0406  NA 141 144 142 141 144  K 4.0 3.8 3.7 3.7 3.9  CL 109 110 108 105 107  CO2 25 27 26 29 26   GLUCOSE 100* 96 104* 109* 103*  BUN 20 24* 30* 39* 39*  CREATININE 1.62* 1.58* 1.75* 1.78* 1.74*  CALCIUM 8.5* 8.3* 8.8* 9.2 9.2   GFR: Estimated Creatinine Clearance: 28.4 mL/min (A) (by C-G formula based on SCr of 1.74 mg/dL (H)). Liver Function Tests: Recent Labs  Lab 08/08/19 1632 08/09/19 0436  AST 17 12*  ALT 12 9  ALKPHOS 73 68  BILITOT 0.5 0.5  PROT 6.1* 5.5*  ALBUMIN 3.1* 2.7*   No results for input(s): LIPASE, AMYLASE in the last 168 hours. No results for input(s): AMMONIA in the last 168 hours. Coagulation Profile: No results for input(s): INR, PROTIME in the last 168 hours. Cardiac Enzymes: No results for input(s): CKTOTAL, CKMB, CKMBINDEX, TROPONINI in the last 168 hours. BNP (last 3 results) No results for input(s): PROBNP in the last 8760 hours. HbA1C: No results for input(s): HGBA1C in the last 72 hours. CBG: No results for input(s): GLUCAP in the last 168 hours. Lipid Profile: No results for input(s): CHOL, HDL, LDLCALC, TRIG, CHOLHDL, LDLDIRECT in the last 72 hours. Thyroid Function Tests: No results for input(s): TSH, T4TOTAL, FREET4, T3FREE, THYROIDAB in the last 72 hours. Anemia Panel: No results for input(s): VITAMINB12, FOLATE, FERRITIN, TIBC, IRON, RETICCTPCT in the last 72 hours. Sepsis Labs: Recent Labs  Lab 08/08/19 1632 08/08/19 2112  PROCALCITON <0.10  --   LATICACIDVEN 1.1 1.5    Recent Results (from the past 240 hour(s))  Blood Culture (routine x 2)     Status: None    Collection Time: 08/08/19  4:58 PM   Specimen: BLOOD  Result Value Ref Range Status   Specimen Description   Final    BLOOD RIGHT ANTECUBITAL Performed at [facility], [place] [address], [place], [place] [address]    Special Requests   Final    BOTTLES DRAWN AEROBIC AND ANAEROBIC Blood Culture adequate volume Performed at [facility] [address], [place], [place] [address]    Culture   Final    NO GROWTH 5 DAYS Performed at [facility], [place] [address] [place],  [address]    Report Status 08/13/2019 FINAL  Final  Blood Culture (routine x 2)     Status: None   Collection Time: 08/08/19  4:58 PM   Specimen: BLOOD  Result Value Ref Range Status   Specimen Description  Final    BLOOD LEFT ANTECUBITAL Performed at [facility], [place] [address], [place], [place] [address]    Special Requests   Final    BOTTLES DRAWN AEROBIC AND ANAEROBIC Blood Culture adequate volume Performed at [facility] [address] [place], [place] [address]    Culture   Final    NO GROWTH 5 DAYS Performed at [facility], [place] [address], [place], [place] [address]    Report Status 08/13/2019 FINAL  Final  Urine culture     Status: Abnormal   Collection Time: 08/08/19  5:39 PM   Specimen: Urine, Clean Catch  Result Value Ref Range Status   Specimen Description URINE, CLEAN CATCH  Final   Special Requests NONE  Final   Culture 50,000 COLONIES/mL YEAST (A)  Final   Report Status 08/10/2019 FINAL  Final  SARS CORONAVIRUS 2 (TAT 6-24 HRS) Nasopharyngeal Nasopharyngeal Swab     Status: None   Collection Time: 08/08/19  5:48 PM   Specimen: Nasopharyngeal Swab  Result Value Ref Range Status   SARS Coronavirus 2 NEGATIVE NEGATIVE Final    Comment: (NOTE) SARS-CoV-2 target nucleic acids are NOT DETECTED. The SARS-CoV-2 RNA is generally detectable in upper and lower respiratory specimens during the acute phase of infection. Negative  results do not preclude SARS-CoV-2 infection, do not rule out co-infections with other pathogens, and should not be used as the sole basis for treatment or other patient management decisions. Negative results must be combined with clinical observations, patient history, and epidemiological information. The expected result is Negative. Fact Sheet for Patients: SugarRoll.be Fact Sheet for Healthcare Providers: https://www.woods-mathews.com/ This test is not yet approved or cleared by the [place] FDA and  has been authorized for detection and/or diagnosis of SARS-CoV-2 by FDA under an Emergency Use Authorization (EUA). This EUA will remain  in effect (meaning this test can be used) for the duration of the COVID-19 declaration under Section 56 4(b)(1) of the Act, 21 U.S.C. section 360bbb-3(b)(1), unless the authorization is terminated or revoked sooner. Performed at [facility], [place] [address] [place],  [address]          Radiology Studies: No results found.      Scheduled Meds: . apixaban  10 mg Oral BID  . [START ON 08/18/2019] apixaban  5 mg Oral BID  . Chlorhexidine Gluconate Cloth  6 each Topical Daily  . cholecalciferol  5,000 Units Oral Daily  . feeding supplement (PRO-STAT SUGAR FREE 64)  30 mL Oral BID  . finasteride  5 mg Oral QHS  . fluconazole  100 mg Oral Daily  . mouth rinse  15 mL Mouth Rinse BID  . multivitamin with minerals  1 tablet Oral Daily  . simvastatin  10 mg Oral QHS  . vitamin B-12  5,000 mcg Oral Daily   Continuous Infusions: . sodium chloride 250 mL (08/09/19 1958)     LOS: 5 days    Time spent: 35 minutes    Kathie Dike, MD [facility]   If 7PM-7AM, please contact night-coverage [email or web address]  08/14/2019, 8:07 PM

## 2019-08-14 NOTE — Progress Notes (Signed)
Physical Therapy Treatment Patient Details Name: Barry Swanson MRN: [id number] DOB: 12/18/1928 Today's Date: 08/14/2019    History of Present Illness Pt is 86 y.o. male,  w hypertension, hyperlipidemia, h/o stroke, schatski ring s/p esophageal dilation, 07-25-19 whose wife apparently died of covid-19 on 22-Aug-2023.  He was admitted with  AMS secondary to UTI.  Pt found to have DVT of L LE and has been on blood thinners (lovenox, eliquis) >24 hours.    PT Comments    Daughter at bed side.  Stated she is taking pt home.  "He would die in a nursing home".  Daughter asking about private duty sitters and a home suction machine for his mucous (pt has a set up here).  Assisted OOB. eneral bed mobility comments: assist for upper body and assist to complete scooting to EOB.   General transfer comment: increased time; cues for sequence and safety; sit to stand to viod then aslso performed a toilet transfer assist to stand from lower level surface.  General Gait Details: moderate unsteadiness but no overt LOB; fatigued easily; cued to keep wa;ler  with him until completely back to chair; cued for posture.  Positioned in recliner upright.  Left pt on RA at 93% and reported to RN.    Follow Up Recommendations  Home health PT;Supervision/Assistance - 24 hour     Equipment Recommendations  3in1 (PT)    Recommendations for Other Services       Precautions / Restrictions Precautions Precautions: Fall Restrictions Weight Bearing Restrictions: No    Mobility  Bed Mobility Overal bed mobility: Needs Assistance Bed Mobility: Supine to Sit;Sit to Supine     Supine to sit: Min assist     General bed mobility comments: assist for upper body and assist to complete scooting to EOB  Transfers Overall transfer level: Needs assistance Equipment used: Rolling walker (2 wheeled) Transfers: Sit to/from [facility] Sit to Stand: Min assist Stand pivot transfers: Min assist       General  transfer comment: increased time; cues for sequence and safety; sit to stand to viod then aslso performed a toilet transfer assist to stand from lower level surface  Ambulation/Gait Ambulation/Gait assistance: Min assist Gait Distance (Feet): 25 Feet(to and from bathroom) Assistive device: Rolling walker (2 wheeled)(uses a 4WW at home) Gait Pattern/deviations: Decreased stride length;Trunk flexed;Drifts right/left Gait velocity: decreased   General Gait Details: moderate unsteadiness but no overt LOB; fatigued easily; cued to keep wa;ler  with him until completely back to chair; cued for posture   Stairs             Wheelchair Mobility    Modified Rankin (Stroke Patients Only)       Balance                                            Cognition Arousal/Alertness: Awake/alert Behavior During Therapy: Flat affect Overall Cognitive Status: Impaired/Different from baseline                                 General Comments: pt recenlty lost his wife 08-22-2023      Exercises      General Comments        Pertinent Vitals/Pain Pain Assessment: No/denies pain Faces Pain Scale: No hurt    Home Living  Prior Function            PT Goals (current goals can now be found in the care plan section) Progress towards PT goals: Progressing toward goals    Frequency    Min 3X/week      PT Plan      Co-evaluation              AM-PAC PT "6 Clicks" Mobility   Outcome Measure  Help needed turning from your back to your side while in a flat bed without using bedrails?: A Little Help needed moving from lying on your back to sitting on the side of a flat bed without using bedrails?: A Little Help needed moving to and from a bed to a chair (including a wheelchair)?: A Little Help needed standing up from a chair using your arms (e.g., wheelchair or bedside chair)?: A Little Help needed to walk in hospital  room?: A Little Help needed climbing 3-5 steps with a railing? : A Lot 6 Click Score: 17    End of Session Equipment Utilized During Treatment: Gait belt Activity Tolerance: Patient tolerated treatment well;Patient limited by fatigue Patient left: in chair;with chair alarm set;with call bell/phone within reach Nurse Communication: Mobility status(left on RA at 93%) PT Visit Diagnosis: Other abnormalities of gait and mobility (R26.89);Unsteadiness on feet (R26.81);Muscle weakness (generalized) (M62.81)     Time: 1455-1520 PT Time Calculation (min) (ACUTE ONLY): 25 min  Charges:  $Gait Training: 8-22 mins $Therapeutic Activity: 8-22 mins                     Rica Koyanagi  PTA Acute  Rehabilitation Services Pager      [phone] Office      [phone]

## 2019-08-14 NOTE — TOC Initial Note (Addendum)
Transition of Care [facility]) - Initial/Assessment Note    Patient Details  Name: Barry Swanson MRN: [id number] Date of Birth: 09-18-1928  [facility] CM/SW Contact:    Wende Neighbors, LCSW Phone Number: 08/14/2019, 11:43 AM  Clinical Narrative:  CSW met patient and daughter at bedside to discuss discharge plans. Patient was extremely hard of hearing, so daughter did assessment with CSW. Daughter Caren Griffins stated that patients spouse dies from covid of 07-28-2023. Caren Griffins stated that her step brother has been living with patient and helping him around the home. CSW spoke to family about rehab but daughter stated she feels like patient would not do well in a facility without family being around. Caren Griffins stated she would like patient to discharge home with family. Caren Griffins stated she will speak to her step brother Ludwig Clarks to work out a care plan for patient. Caren Griffins stated she will reach back out to [place] and let her know what her family has come up with. Daughter stated she will also look into private duty nursing to help patient around the home. CSW to find patient a [place] agency to follow him in the home.  Per daughter patient has rolling walker and a Rolator at home                    Expected Discharge Plan: [place] Barriers to Discharge: Continued Medical Work up   Patient Goals and CMS Choice Patient states their goals for this hospitalization and ongoing recovery are:: per daughter she would liek patient to be home with family CMS Medicare.gov Compare Post Acute Care list provided to:: Other (Comment Required)(daughter) Choice offered to / list presented to : Adult Children  Expected Discharge Plan and Services Expected Discharge Plan: [place] In-house Referral: Clinical Social Work   Post Acute Care Choice: [place] arrangements for the past 2 months: [place]                                      Prior Living  Arrangements/Services Living arrangements for the past 2 months: Single Family Home Lives with:: Self Patient language and need for interpreter reviewed:: Yes Do you feel safe going back to the place where you live?: Yes      Need for Family Participation in Patient Care: Yes (Comment) Care giver support system in place?: Yes (comment)   Criminal Activity/Legal Involvement Pertinent to Current Situation/Hospitalization: No - Comment as needed  Activities of Daily Living Home Assistive Devices/Equipment: Built-in shower seat, Blood pressure cuff, Walker (specify type), Grab bars in shower(front wheeled walker, 4 wheeled walker) ADL Screening (condition at time of admission) Patient's cognitive ability adequate to safely complete daily activities?: No Is the patient deaf or have difficulty hearing?: No Does the patient have difficulty seeing, even when wearing glasses/contacts?: No Does the patient have difficulty concentrating, remembering, or making decisions?: Yes Patient able to express need for assistance with ADLs?: Yes Does the patient have difficulty dressing or bathing?: Yes(secondary to weakness) Independently performs ADLs?: No Communication: Independent Dressing (OT): Needs assistance Is this a change from baseline?: Change from baseline, expected to last >3 days Grooming: Needs assistance Is this a change from baseline?: Change from baseline, expected to last >3 days Feeding: Needs assistance Is this a change from baseline?: Change from baseline, expected to last >3 days Bathing: Needs assistance Is  this a change from baseline?: Change from baseline, expected to last >3 days Toileting: Needs assistance Is this a change from baseline?: Change from baseline, expected to last >3days In/Out Bed: Needs assistance Is this a change from baseline?: Change from baseline, expected to last >3 days Walks in Home: Needs assistance Is this a change from baseline?: Change from baseline,  expected to last >3 days Does the patient have difficulty walking or climbing stairs?: Yes(secondary to weakness) Weakness of Legs: Both Weakness of Arms/Hands: None  Permission Sought/Granted Permission sought to share information with : Family Supports Permission granted to share information with : Yes, Verbal Permission Granted  Share Information with NAME: Harlan Stains     Permission granted to share info w Relationship: daughter  Permission granted to share info w Contact Information: [phone]  Emotional Assessment Appearance:: Appears stated age Attitude/Demeanor/Rapport: Engaged Affect (typically observed): Accepting Orientation: : Oriented to Self, Oriented to Place Alcohol / Substance Use: Not Applicable Psych Involvement: No (comment)  Admission diagnosis:  Lower urinary tract infection, acute [N39.0] Altered mental status, unspecified altered mental status type [R41.82] Dysphagia, unspecified type [R13.10] AMS (altered mental status) [R41.82] Patient Active Problem List   Diagnosis Date Noted  . AMS (altered mental status) 08/08/2019  . Bladder stone 06/01/2019  . Sepsis ([place]) 04/30/2019  . Acute pyelonephritis 04/30/2019  . Acute kidney injury ([place]) 04/30/2019  . Confusion   . Essential hypertension   . Acute encephalopathy 02/11/2015  . TIA (transient ischemic attack) 02/11/2015  . Hypertension   . Hyperlipidemia   . Calculus (=stone) 11/29/2012  . Malignant neoplasm of prostate ([place]) 11/29/2012  . Bladder outflow obstruction 11/29/2012   PCP:  [name], MD Pharmacy:   [facility] [place] [address] Phone: [phone] Fax: [phone]     Social Determinants of Health (SDOH) Interventions    Readmission Risk Interventions Readmission Risk Prevention Plan 05/03/2019  Transportation Screening Complete  PCP or Specialist Appt within 3-5 Days  Complete  HRI or [place] Complete  Social Work Consult for [place] Planning/Counseling Complete  Palliative Care Screening Not Applicable  Medication Review Press photographer) Complete  Some recent data might be hidden

## 2019-08-14 NOTE — Care Management Important Message (Signed)
Important Message  Patient Details IM Letter given to Rhea Pink SW to present to the Patient Name: Barry Swanson MRN: [id number] Date of Birth: Jun 17, 1929   Medicare Important Message Given:  Yes     Kerin Salen 08/14/2019, 12:54 PM

## 2019-08-14 NOTE — Progress Notes (Signed)
  Speech Language Pathology Treatment: Dysphagia  Patient Details Name: Barry Swanson MRN: [id number] DOB: 01-03-1929 Today's Date: 08/14/2019 Time: 3710-6269 SLP Time Calculation (min) (ACUTE ONLY): 29 min  Assessment / Plan / Recommendation Clinical Impression  Upon SLP arrival pt wash finishing taking his medicine. RN shared that he'd had no overt s/s of aspiration up to that point, although on the last swallow he began to have a strong cough response. His daughter says that he often gets to a certain point in his meal and starts coughing as well. Question if he is having food that remains in his esophagus with potential for backflow into the pharynx. SLP provided warm water and had him sip on it while sitting more upright than he had been. One additional delayed cough was noted, but otherwise this was seemingly tolerated. No additional solids were offered at this moment since he was "full" after all his medications, but his daughter also shared that she is having to chop and soften his foods more. SLP provided education about aspiration/esophageal precautions to reduce signs of dysphagia/aspiration, although likely not eliminate it given his esophageal hx. Will also downgrade diet to Dys 2 solids to try to facilitate clearance better. Pt may also benefit from smaller, more frequent offerings of food and sips of warm water at the start/end of meals. Will continue to follow briefly.   HPI HPI: 85 y.o. male,  w hypertension, hyperlipidemia, h/o stroke, schatski ring s/p esophageal dilation, 2019-07-08 whose wife apparently died of covid-19 on 08-05-23 presents with " can't keep food down, and temp 100.  Pt was apparently brought from his pcp office to ER for reflux of food as well as lower extremity swelling.        SLP Plan  Continue with current plan of care       Recommendations  Diet recommendations: Dysphagia 2 (fine chop);Thin liquid Liquids provided via: Cup;Straw Medication Administration:  Whole meds with puree(crush if larger) Supervision: Staff to assist with self feeding;Full supervision/cueing for compensatory strategies Compensations: Minimize environmental distractions;Slow rate;Small sips/bites;Follow solids with liquid;Other (Comment)(start/end meal with sips of earm water) Postural Changes and/or Swallow Maneuvers: Seated upright 90 degrees;Upright 30-60 min after meal                Oral Care Recommendations: Oral care BID Follow up Recommendations: 24 hour supervision/assistance SLP Visit Diagnosis: Dysphagia, pharyngoesophageal phase (R13.14) Plan: Continue with current plan of care       GO                Osie Bond., M.A. [place] Acute Rehabilitation Services Pager [phone] Office [phone]  08/14/2019, 12:15 PM

## 2019-08-14 NOTE — Progress Notes (Signed)
Hematuria resolving. Urine color now light pink and he continues to void small amount of urine.

## 2019-08-15 LAB — BASIC METABOLIC PANEL
Anion gap: 8 (ref 5–15)
BUN: 40 mg/dL — ABNORMAL HIGH (ref 8–23)
CO2: 29 mmol/L (ref 22–32)
Calcium: 8.9 mg/dL (ref 8.9–10.3)
Chloride: 105 mmol/L (ref 98–111)
Creatinine, Ser: 1.85 mg/dL — ABNORMAL HIGH (ref 0.61–1.24)
GFR calc Af Amer: 37 mL/min — ABNORMAL LOW (ref >60)
GFR calc non Af Amer: 32 mL/min — ABNORMAL LOW (ref >60)
Glucose, Bld: 109 mg/dL — ABNORMAL HIGH (ref 70–99)
Potassium: 3.7 mmol/L (ref 3.5–5.1)
Sodium: 142 mmol/L (ref 135–145)

## 2019-08-15 LAB — CBC WITH DIFFERENTIAL/PLATELET
Abs Immature Granulocytes: 0.02 10*3/uL (ref 0.00–0.07)
Basophils Absolute: 0.1 10*3/uL (ref 0.0–0.1)
Basophils Relative: 1 %
Eosinophils Absolute: 0.5 10*3/uL (ref 0.0–0.5)
Eosinophils Relative: 6 %
HCT: 38.1 % — ABNORMAL LOW (ref 39.0–52.0)
Hemoglobin: 11.2 g/dL — ABNORMAL LOW (ref 13.0–17.0)
Immature Granulocytes: 0 %
Lymphocytes Relative: 29 %
Lymphs Abs: 2.5 10*3/uL (ref 0.7–4.0)
MCH: 27.9 pg (ref 26.0–34.0)
MCHC: 29.4 g/dL — ABNORMAL LOW (ref 30.0–36.0)
MCV: 94.8 fL (ref 80.0–100.0)
Monocytes Absolute: 0.5 10*3/uL (ref 0.1–1.0)
Monocytes Relative: 6 %
Neutro Abs: 4.9 10*3/uL (ref 1.7–7.7)
Neutrophils Relative %: 58 %
Platelets: 335 10*3/uL (ref 150–400)
RBC: 4.02 MIL/uL — ABNORMAL LOW (ref 4.22–5.81)
RDW: 14.4 % (ref 11.5–15.5)
WBC: 8.4 10*3/uL (ref 4.0–10.5)
nRBC: 0 % (ref 0.0–0.2)

## 2019-08-15 MED ORDER — ZOLPIDEM TARTRATE 5 MG PO TABS: 5.0000 mg | ORAL_TABLET | Freq: Every evening | ORAL | 0 refills | Status: AC | PRN

## 2019-08-15 MED ORDER — ENSURE ENLIVE PO LIQD: 237.0000 mL | Freq: Two times a day (BID) | ORAL | 12 refills | Status: AC

## 2019-08-15 MED ORDER — PRO-STAT SUGAR FREE PO LIQD: 30.0000 mL | Freq: Two times a day (BID) | ORAL | 0 refills | Status: DC

## 2019-08-15 MED ORDER — APIXABAN (ELIQUIS) VTE STARTER PACK (10MG AND 5MG): ORAL_TABLET | ORAL | 0 refills | Status: DC

## 2019-08-15 MED ORDER — FLUCONAZOLE 100 MG PO TABS: 100.0000 mg | ORAL_TABLET | Freq: Every day | ORAL | 0 refills | Status: AC

## 2019-08-15 NOTE — TOC Transition Note (Signed)
Transition of Care [facility]) - CM/SW Discharge Note   Patient Details  Name: Natale Thoma MRN: [id number] Date of Birth: Feb 06, 1929  Transition of [facility]) CM/SW Contact:  Wende Neighbors, LCSW Phone Number: 08/15/2019, 10:45 AM   Clinical Narrative:   CSW spoke with patients daughter via phone. Daughter stated she is agreeable for patient to return home with [facility] following. CSW ordered a 3 in 1 and it will be delivered to patients room prior to discharge. Daughter stated she is on her way to pick up patient.     Final next level of care: [place] Barriers to Discharge: No Barriers Identified   Patient Goals and CMS Choice Patient states their goals for this hospitalization and ongoing recovery are:: per daughter she would liek patient to be home with family CMS Medicare.gov Compare Post Acute Care list provided to:: Other (Comment Required)(daughter) Choice offered to / list presented to : Adult Children  Discharge Placement                Patient to be transferred to facility by: family Name of family member notified: spoke with daughter Patient and family notified of of transfer: 08/15/19  Discharge Plan and Services In-house Referral: Clinical Social Work   Post Acute Care Choice: Home Health                               Social Determinants of Health (SDOH) Interventions     Readmission Risk Interventions Readmission Risk Prevention Plan 05/03/2019  Transportation Screening Complete  PCP or Specialist Appt within 3-5 Days Complete  HRI or [place] Complete  Social Work Consult for [place] Planning/Counseling Complete  Palliative Care Screening Not Applicable  Medication Review [employer]) Complete  Some recent data might be hidden

## 2019-08-15 NOTE — Discharge Instructions (Signed)
Dysphagia Eating Plan, Pureed This diet is helpful for people with moderate to severe swallowing problems. Pureed foods are smooth and are prepared without lumps so that they can be swallowed safely. Work with your health care provider and your diet and nutrition specialist (dietitian) to make sure that you are following the diet safely and getting all the nutrients you need. What are tips for following this plan? General instructions  You may eat foods that are soft and have a pudding-like texture.  Do not eat foods that you have to chew. If you have to chew the food, then you cannot eat it.  Avoid foods that are hard, dry, sticky, chunky, lumpy, or stringy. Also avoid foods with nuts, seeds, raisins, skins, or pulp.  You may be instructed to thicken liquids. Follow your health care provider's instructions about how to do this and to what consistency. Cooking   If a food is not originally a smooth texture, you may be able to eat the food after: ? Pureeing it. This can be done with a blender. ? Moistening it. This can be done by adding juice, cooking liquid, gravy, or sauce to a dry food and then pureeing it. For example, you may have bread if you soak it in milk and puree it.  If a food is too thin, you may add a commercial thickener, corn starch, rice cereal, or potato flakes to thicken it.  Strain and throw away any liquid that separates from a solid pureed food before eating.  Strain lumps, chunks, pulp, and seeds from pureed foods before eating.  Reheat foods slowly to prevent a tough crust from forming. Meal planning  Eat a variety of foods to get all the nutrients you need.  Add dry milk or protein powder to food to increase calories and protein content.  Follow your meal plan as told by your dietitian. What foods are allowed? The items listed may not be a complete list. Talk with your dietitian about what dietary choices are best for you. Grains Soft breads, pancakes,  [place] toast, muffins, and bread stuffing pureed to a smooth, moist texture, without nuts or seeds. Cooked cereals that have a pudding-like consistency, such as cream of wheat or farina. Pureed oatmeal. Pureed, well-cooked pasta and rice. Vegetables Pureed vegetables. Smooth tomato paste or sauce. Mashed or pureed potatoes without skin. Fruits Pureed fruits such as melons and apples without seeds or pulp. Mashed bananas. Mashed avocado. Fruit juices without pulp or seeds. Meats and other protein foods Pureed meat, poultry, and fish. Smooth pate or liverwurst. Smooth souffles. Pureed beans (such as lentils). Pureed eggs. Smooth nut and seed butters. Pureed tofu. Dairy Yogurt. Milk. Pureed cottage cheese. Nutritional dairy drinks or shakes. Cream cheese. Smooth pudding, ice cream, sherbet, and malts. Fats and oils Butter. Margarine. Vegetable oils. Smooth and strained gravy. Sour cream. Mayonnaise. Smooth sauces such as white sauce, cheese sauce, or hollandaise sauce. Sweets and desserts Moistened and pureed cookies and cakes. Whipped topping. Gelatin. Pudding pops. Seasoning and other foods Finely ground spices. Jelly. Honey. Pureed casseroles. Strained soups. Pureed sandwiches. Beverages Anything prepared at the consistency recommended by your dietitian. What foods are not allowed? The items listed may not be a complete list. Talk with your dietitian about what dietary choices are best for you. Grains Oatmeal. Dry cereals. Hard breads. Breads with seeds or nuts. Whole pasta, rice, or other grains. Whole pancakes, waffles, biscuits, muffins, or rolls. Vegetables Whole vegetables. Stringy vegetables (such as celery). Tomatoes or tomato  sauce with seeds. Fried vegetables. Fruits Whole fresh, frozen, canned, or dried fruits that have not been pureed. Stringy fruits, such as pineapple or coconut. Watermelon with seeds. Dried fruit or fruit leather. Meat and other protein foods Whole or ground  meat, fish, or poultry. Dried or cooked lentils or legumes that have been cooked but not mashed or pureed. Non-pureed eggs. Nuts and seeds. Crunchy peanut butter. Whole tofu or other meat alternatives. Dairy Cheese cubes or slices. Non-pureed cottage cheese. Yogurt with fruit chunks. Fats and oils All fats and sauces that have lumps or chunks. Sweets and desserts Solid desserts. Sticky, chewy sweets (such as licorice and caramel). Candy with nuts or coconut. Seasoning and other foods Coarse or seeded herbs and spices. Chunky preserves. Jams with seeds. Whole sandwiches. Non-pureed casseroles. Chunky soups. Summary  Pureed foods can be helpful for people with moderate to severe swallowing problems.  On the dysphagia eating plan, you may eat foods that are soft and have a pudding-like texture. You should avoid foods that you have to chew. If you have to chew the food, then you cannot eat it.  You may be instructed to thicken liquids. Follow your health care provider's instructions about how to do this and to what consistency. This information is not intended to replace advice given to you by your health care provider. Make sure you discuss any questions you have with your health care provider. Document Released: 08/03/2005 Document Revised: 11/24/2018 Document Reviewed: 10/06/2016 Elsevier Patient Education  2020 [place].   Dysphagia Eating Plan, Minced and Moist Foods This eating plan is for people with moderate swallowing problems who are transitioning from pureed to solid foods. Moist and minced foods are soft and cut into very small chunks so that they can be swallowed safely. On this eating plan, you may be instructed to drink liquids that are thickened. Work with your health care provider and your diet and nutrition specialist (dietitian) to make sure that you are following the diet safely and getting all the nutrients you need. What are tips for following this plan? General  guidelines for foods   You may eat foods that are soft and moist.  Always test food texture before taking a bite. Poke food with a fork or spoon to make sure it is tender.  Take small bites. Each bite should be smaller than your little finger nail (about 4 mm by 4 mm).  If you were on a pureed food eating plan, you may still eat any of the foods included in that diet.  Avoid foods that are dry, hard, sticky, chewy, coarse, or crunchy.  Avoid foods that separate into thin liquids and solids, such as cereal with milk or chunky soups.  Avoid liquids that have seeds or chunks.  If instructed by your health care provider, thicken liquids. Follow your health care provider's instructions about what products to use, how to do this, and to what thickness. ? You may use a commercial thickener, rice cereal, or potato flakes. ? Thickened liquids are usually a "pudding-like" consistency, or they may be as thick as honey or thick enough to eat with a spoon. Cooking  You may need to use a blender, whisk, or masher to soften some of your foods.  To moisten foods, you may add liquids while you are blending, mashing, or grinding your foods to the right consistency. These liquids include gravies, sauces, vegetable or fruit juice, milk, half and half, or water.  Reheat foods slowly to prevent  a tough crust from forming. Meal planning  Eat a variety of foods in order to get all the nutrients you need.  Follow your meal plan as told by your health care provider or dietitian. What foods are allowed? Grains Soaked soft breads without nuts or seeds. Pancakes, sweet rolls, pastries, and [place] toast that have been moistened with syrup or sauce. Well-cooked pasta, noodles, rice, and bread dressing in very small pieces and thick sauce. Soft dumplings or spaetzle in very small pieces and butter or gravy. Soft-cooked cereals. Vegetables Very soft, well-cooked vegetables in very small pieces. Soft-cooked,  mashed potatoes. Thickened vegetable juice. Fruits Canned or cooked fruits that are soft or moist and do not have skin or seeds. Fresh, soft bananas. Thickened fruit juices. Meat and other protein foods Tender, moist, and finely minced or ground meats or poultry. Moist meatballs or meatloaf. Fish without bones. Scrambled, poached, or soft-cooked eggs. Tofu. Tempeh and meat alternatives in very small pieces. Well-cooked, moistened and mashed beans, baked beans, peas, and other legumes. Dairy Thickened milk. Cream cheese. Yogurt. Cottage cheese. Sour cream. Fats and oils Butter. Margarine. Cream for cereal, depending on liquid consistency allowed. Gravy. Cream sauces. Mayonnaise. Sweets and desserts Pudding. Custard. Ice cream and sherbet. Whipped toppings. Soft, moist cakes. Icing. Jelly. Jams and preserves without seeds. Seasoning and other foods Sauces and salsas that have soft chunks that are smaller than 22mm. Salad dressings. Casseroles with small pieces of tender meat. All seasonings and sweeteners. Beverages Anything prepared at the thickness recommended by your dietitian. What foods are not allowed? Grains Breads that are hard or have nuts or seeds. Dry biscuits, pancakes, waffles, and bread dressing. Coarse cereals. Cereals that have nuts, seeds, dried fruits, or coconut. Sticky rice. Large pieces of pasta. Vegetables All raw vegetables. Tough, fibrous, chewy, or stringy cooked vegetables, such as celery, peas, broccoli, cabbage, Brussels sprouts, and asparagus. Potato skins. Potato and other vegetable chips. Fried or French-fried potatoes. Cooked corn and peas. Fruits Hard, crunchy, stringy, high-pulp, and juicy raw fruits such as apples, pineapple, papaya, and watermelon. Fruits with skins and seeds, such as grapes. Dried fruit and fruit leather. Meats and other protein foods Large pieces of meat. Dry, tough meats, such as bacon, sausage, and hot dogs. Chicken, [place], or fish with  skin and bones. Crunchy peanut butter. Nuts. Seeds. Dairy Yogurt with nuts, seeds, or large chunks. Large chunks of cheese. Frozen desserts and milk consistency not allowed by your dietitian. Sweets and desserts Coarse, hard, chewy, or sticky desserts. Any dessert with nuts, seeds, coconut, pineapple, or dried fruit. Bread pudding. Seasoning and other foods Soups and casseroles with large chunks. Sandwiches. Pizza. Summary  Moist and minced foods can be helpful for people with moderate swallowing problems.  On the dysphagia eating plan, you may eat foods that are soft, moist, and cut into pieces smaller than 66mm by 28mm.  You may be instructed to thicken liquids. Follow your health care provider's instructions about how to do this and to what consistency. This information is not intended to replace advice given to you by your health care provider. Make sure you discuss any questions you have with your health care provider. Document Released: 08/03/2005 Document Revised: 11/24/2018 Document Reviewed: 11/13/2016 Elsevier Patient Education  2020 [place] on my medicine - ELIQUIS (apixaban)  This medication education was reviewed with me or my healthcare representative as part of my discharge preparation.   Why was Eliquis prescribed for you? Eliquis was prescribed  to treat blood clots that may have been found in the veins of your legs (deep vein thrombosis) or in your lungs (pulmonary embolism) and to reduce the risk of them occurring again.  What do You need to know about Eliquis ? The starting dose is 10 mg (two 5 mg tablets) taken TWICE daily for the FIRST SEVEN (7) DAYS, then on 08/18/2019 in the evening  the dose is reduced to ONE 5 mg tablet taken TWICE daily.  Eliquis may be taken with or without food.   Try to take the dose about the same time in the morning and in the evening. If you have difficulty swallowing the tablet whole please discuss with your  pharmacist how to take the medication safely.  Take Eliquis exactly as prescribed and DO NOT stop taking Eliquis without talking to the doctor who prescribed the medication.  Stopping may increase your risk of developing a new blood clot.  Refill your prescription before you run out.  After discharge, you should have regular check-up appointments with your healthcare provider that is prescribing your Eliquis.    What do you do if you miss a dose? If a dose of ELIQUIS is not taken at the scheduled time, take it as soon as possible on the same day and twice-daily administration should be resumed. The dose should not be doubled to make up for a missed dose.  Important Safety Information A possible side effect of Eliquis is bleeding. You should call your healthcare provider right away if you experience any of the following: ? Bleeding from an injury or your nose that does not stop. ? Unusual colored urine (red or dark brown) or unusual colored stools (red or black). ? Unusual bruising for unknown reasons. ? A serious fall or if you hit your head (even if there is no bleeding).  Some medicines may interact with Eliquis and might increase your risk of bleeding or clotting while on Eliquis. To help avoid this, consult your healthcare provider or pharmacist prior to using any new prescription or non-prescription medications, including herbals, vitamins, non-steroidal anti-inflammatory drugs (NSAIDs) and supplements.  This website has more information on Eliquis (apixaban): http://www.eliquis.com/eliquis/home   Dehydration  Dehydration is a condition in which there is not enough fluid or water in the body. This happens when you lose more fluids than you take in. Important organs, such as the kidneys, brain, and heart, cannot function without a proper amount of fluids. Any loss of fluids from the body can lead to dehydration. People age 67 or older have a higher risk of dehydration than younger  adults because in older age, the body:  Is less able to conserve water.  Does not respond to temperature changes as well.  Does not get thirsty as easily or quickly. Dehydration can range from mild to severe. This condition should be treated right away to prevent it from becoming severe. What are the causes? Dehydration may be caused by:  Vomiting.  Diarrhea.  Excessive sweating, such as from heat exposure or exercise.  Not drinking enough fluid, especially: ? When ill. ? While doing activity that requires a lot of energy.  Excessive urination.  Fever.  Infection.  Certain medicines, such as medicines that cause the body to lose excess fluid (diuretics).  Inability to access safe drinking water.  Poorly controlled blood sugars.  Reduced physical ability to get adequate water and food. What increases the risk? This condition is more likely to develop in people who:  Have  a long-term (chronic) illness, such as: ? An illness that may increase urination, such as diabetes. ? Kidney, heart, or lung disease. ? Neurological or psychological disorders, such as dementia.  Are age 66 or older.  Are disabled.  Live in a place with high altitude. What are the signs or symptoms? Symptoms of mild dehydration may include:  Thirst.  Dry lips.  Slightly dry mouth.  Dry, warm skin.  Dizziness. Symptoms of moderate dehydration may include:  Very dry mouth.  Muscle cramps.  Dark urine. Urine may be the color of tea.  Decreased urine production.  Decreased tear production.  Heartbeat that is irregular or faster than normal (palpitations).  Headache.  Light-headedness, especially when you stand up from a sitting position.  Fainting (syncope). Symptoms of severe dehydration may include:  Changes in skin, such as: ? Cold and clammy skin. ? Blotchy (mottled) or pale skin. ? Skin that does not quickly return to normal after being lightly pinched and released  (poor skin turgor).  Changes in body fluids, such as: ? Extreme thirst. ? No tear production. ? Inability to sweat when body temperature is high, such as in hot weather. ? Very little urine production.  Changes in vital signs, such as: ? Weak pulse. ? Pulse that is more than 100 beats a minute when sitting still. ? Rapid breathing. ? Low blood pressure.  Other changes, such as: ? Sunken eyes. ? Cold hands and feet. ? Confusion. ? Lack of energy (lethargy). ? Difficulty waking up from sleep. ? Short-term weight loss. ? Unconsciousness. How is this diagnosed? This condition is diagnosed based on your symptoms and a physical exam. Blood and urine tests may be done to help confirm the diagnosis. How is this treated? Treatment for this condition depends on the severity. Mild or moderate dehydration can often be treated at home. Treatment should be started right away. Do not wait until dehydration becomes severe. Severe dehydration is an emergency and it needs to be treated in a hospital. Treatment for mild dehydration may include:  Drinking more fluids.  Replacing salts and minerals in your blood (electrolytes). Treatment for moderate dehydration may include:  Drinking an oral rehydration solution (ORS). This is a drink that helps you replace fluids and electrolytes (rehydrate). It is found at pharmacies and retail stores. Treatment for severe dehydration may include:  Receiving fluids through an IV tube.  Receiving an electrolyte solution through a tube passed through your nose and into your stomach (nasogastric tube or NG tube).  Correcting abnormalities in electrolytes.  Treating the underlying cause of dehydration. Follow these instructions at home:  If directed by your health care provider, drink an ORS: ? Make an ORS by following instructions on the package. ? Start by drinking small amounts, about  cup (120 mL) every 5-10 minutes. ? Slowly increase how much you  drink until you have taken the amount recommended by your health care provider.  Drink enough clear fluid to keep your urine clear or pale yellow. If you were told to drink an ORS, finish the ORS first, then start slowly drinking other clear fluids. Drink fluids such as: ? Water. Do not drink only water. Doing that can lead to having too little salt (sodium) in the body (hyponatremia). ? Ice chips. ? Fruit juice that you have added water to (diluted fruit juice). ? Low-calorie sports drinks.  Avoid: ? Alcohol. ? Drinks that contain a lot of sugar. These include high-calorie sports drinks, fruit juice that is  not diluted, and soda. ? Caffeine. ? Foods that are greasy or contain a lot of fat or sugar.   Take over-the-counter and prescription medicines only as told by your health care provider.  Do not take sodium tablets. This can lead to having too much sodium in the body (hypernatremia).  Eat foods that contain a healthy balance of electrolytes, such as bananas, oranges, potatoes, tomatoes, and spinach.  Keep all follow-up visits as told by your health care provider. This is important. Contact a health care provider if:  You have abdominal pain that: ? Gets worse. ? Stays in one area (localizes).  You have a rash.  You have a stiff neck.  You are sleepier, more irritable, or more difficult to wake up than usual.  You feel weak, dizzy, or very thirsty. Get help right away if:  You have: ? Symptoms of severe dehydration. ? A fever. ? A severe headache. ? Vomiting or diarrhea that gets worse or does not go away. ? Diarrhea for more than 24 hours. ? Blood or green matter (bile) in your vomit. ? Blood in your stool. This may cause stool to look black and tarry. ? Trouble breathing.  You cannot drink fluids without vomiting.  Your symptoms get worse with treatment.  You have not urinated in 6-8 hours.  You have urinated only a small amount of very dark urine over 6-8  hours.  You faint.  Your heart rate while sitting still is over 100 beats a minute. This information is not intended to replace advice given to you by your health care provider. Make sure you discuss any questions you have with your health care provider. Document Released: 10/24/2003 Document Revised: 07/16/2017 Document Reviewed: 09/27/2015 Elsevier Patient Education  2020 [employer].

## 2019-08-17 DIAGNOSIS — Z8673 Personal history of transient ischemic attack (TIA), and cerebral infarction without residual deficits: Secondary | ICD-10-CM | POA: Diagnosis not present

## 2019-08-17 DIAGNOSIS — B1910 Unspecified viral hepatitis B without hepatic coma: Secondary | ICD-10-CM | POA: Diagnosis not present

## 2019-08-17 DIAGNOSIS — M549 Dorsalgia, unspecified: Secondary | ICD-10-CM | POA: Diagnosis not present

## 2019-08-17 DIAGNOSIS — N400 Enlarged prostate without lower urinary tract symptoms: Secondary | ICD-10-CM | POA: Diagnosis not present

## 2019-08-17 DIAGNOSIS — Z9181 History of falling: Secondary | ICD-10-CM | POA: Diagnosis not present

## 2019-08-17 DIAGNOSIS — N390 Urinary tract infection, site not specified: Secondary | ICD-10-CM | POA: Diagnosis not present

## 2019-08-17 DIAGNOSIS — N1832 Chronic kidney disease, stage 3b: Secondary | ICD-10-CM | POA: Diagnosis not present

## 2019-08-17 DIAGNOSIS — I82402 Acute embolism and thrombosis of unspecified deep veins of left lower extremity: Secondary | ICD-10-CM | POA: Diagnosis not present

## 2019-08-17 DIAGNOSIS — N183 Chronic kidney disease, stage 3 unspecified: Secondary | ICD-10-CM | POA: Diagnosis not present

## 2019-08-17 DIAGNOSIS — H9190 Unspecified hearing loss, unspecified ear: Secondary | ICD-10-CM | POA: Diagnosis not present

## 2019-08-17 DIAGNOSIS — G9340 Encephalopathy, unspecified: Secondary | ICD-10-CM | POA: Diagnosis not present

## 2019-08-17 DIAGNOSIS — Z7901 Long term (current) use of anticoagulants: Secondary | ICD-10-CM | POA: Diagnosis not present

## 2019-08-17 DIAGNOSIS — I129 Hypertensive chronic kidney disease with stage 1 through stage 4 chronic kidney disease, or unspecified chronic kidney disease: Secondary | ICD-10-CM | POA: Diagnosis not present

## 2019-08-17 DIAGNOSIS — E440 Moderate protein-calorie malnutrition: Secondary | ICD-10-CM | POA: Diagnosis not present

## 2019-08-17 DIAGNOSIS — E785 Hyperlipidemia, unspecified: Secondary | ICD-10-CM | POA: Diagnosis not present

## 2019-08-17 DIAGNOSIS — K222 Esophageal obstruction: Secondary | ICD-10-CM | POA: Diagnosis not present

## 2019-08-17 DIAGNOSIS — R1310 Dysphagia, unspecified: Secondary | ICD-10-CM | POA: Diagnosis not present

## 2019-08-17 DIAGNOSIS — G8929 Other chronic pain: Secondary | ICD-10-CM | POA: Diagnosis not present

## 2019-08-17 DIAGNOSIS — Z87891 Personal history of nicotine dependence: Secondary | ICD-10-CM | POA: Diagnosis not present

## 2019-08-19 DIAGNOSIS — G8929 Other chronic pain: Secondary | ICD-10-CM | POA: Diagnosis not present

## 2019-08-19 DIAGNOSIS — N400 Enlarged prostate without lower urinary tract symptoms: Secondary | ICD-10-CM | POA: Diagnosis not present

## 2019-08-19 DIAGNOSIS — R1310 Dysphagia, unspecified: Secondary | ICD-10-CM | POA: Diagnosis not present

## 2019-08-19 DIAGNOSIS — I82402 Acute embolism and thrombosis of unspecified deep veins of left lower extremity: Secondary | ICD-10-CM | POA: Diagnosis not present

## 2019-08-19 DIAGNOSIS — N1832 Chronic kidney disease, stage 3b: Secondary | ICD-10-CM | POA: Diagnosis not present

## 2019-08-19 DIAGNOSIS — I129 Hypertensive chronic kidney disease with stage 1 through stage 4 chronic kidney disease, or unspecified chronic kidney disease: Secondary | ICD-10-CM | POA: Diagnosis not present

## 2019-08-19 DIAGNOSIS — M549 Dorsalgia, unspecified: Secondary | ICD-10-CM | POA: Diagnosis not present

## 2019-08-19 DIAGNOSIS — G9340 Encephalopathy, unspecified: Secondary | ICD-10-CM | POA: Diagnosis not present

## 2019-08-19 DIAGNOSIS — N390 Urinary tract infection, site not specified: Secondary | ICD-10-CM | POA: Diagnosis not present

## 2019-08-21 DIAGNOSIS — I129 Hypertensive chronic kidney disease with stage 1 through stage 4 chronic kidney disease, or unspecified chronic kidney disease: Secondary | ICD-10-CM | POA: Diagnosis not present

## 2019-08-21 DIAGNOSIS — N390 Urinary tract infection, site not specified: Secondary | ICD-10-CM | POA: Diagnosis not present

## 2019-08-21 DIAGNOSIS — R1310 Dysphagia, unspecified: Secondary | ICD-10-CM | POA: Diagnosis not present

## 2019-08-21 DIAGNOSIS — N400 Enlarged prostate without lower urinary tract symptoms: Secondary | ICD-10-CM | POA: Diagnosis not present

## 2019-08-21 DIAGNOSIS — I82402 Acute embolism and thrombosis of unspecified deep veins of left lower extremity: Secondary | ICD-10-CM | POA: Diagnosis not present

## 2019-08-21 DIAGNOSIS — M549 Dorsalgia, unspecified: Secondary | ICD-10-CM | POA: Diagnosis not present

## 2019-08-21 DIAGNOSIS — G8929 Other chronic pain: Secondary | ICD-10-CM | POA: Diagnosis not present

## 2019-08-21 DIAGNOSIS — G9340 Encephalopathy, unspecified: Secondary | ICD-10-CM | POA: Diagnosis not present

## 2019-08-21 DIAGNOSIS — N1832 Chronic kidney disease, stage 3b: Secondary | ICD-10-CM | POA: Diagnosis not present

## 2019-08-22 NOTE — Discharge Summary (Signed)
[facility] Discharge Summary   Patient: Barry Swanson JHE:[id number]   PCP: [name], MD DOB: 1929-08-08   Date of admission: 08/08/2019   Date of discharge: 08/15/2019     Discharge Diagnoses:  Principal diagnosis Acute metabolic encephalopathy from UTI  Active Problems:   Acute encephalopathy   Hyperlipidemia   AMS (altered mental status)   Acute DVT (deep venous thrombosis) (HCC)   Hematuria   CKD (chronic kidney disease) stage 3, GFR 30-59 ml/min   Dysphagia   Admitted From: Home Disposition:  Home   Recommendations for Outpatient Follow-up:  1. PCP: Follow-up with PCP in 1 week.  Repeat BMP in 1 week 2. Follow up LABS/TEST: None  Follow-up Information    [name], MD. Schedule an appointment as soon as possible for a visit in 1 week(s).   Specialty: Internal Medicine Why: assess for complicated grief. repeat BMP in 1 week.  Contact information: [place] [address] [address], [facility] Follow up.   Specialty: Home Health Services Why: Follow up with [facility] for home health needs Contact information: [address] [place] [address] [phone]          Diet recommendation: Cardiac diet  Activity: The patient is advised to gradually reintroduce usual activities,as tolerated  Discharge Condition: good  Code Status: Full code   History of present illness: As per the H and P dictated on admission, "Delmus Warwick  is a 86 y.o. male,  w hypertension, hyperlipidemia, h/o stroke, schatski ring s/p esophageal dilation, 07-24-2019 whose wife apparently died of covid-19 on 08/21/2023 presents with " can't keep food down, and temp 100.  Pt was apparently brought from his pcp office to ER for reflux of food as well as lower extremity swelling.  "  Hospital Course:  Summary of his active problems in the hospital is as following. 1. Acute metabolic encephalopathy.  Likely secondary to urinary tract  infection and low-grade fever.  It appears that his overall mental status has improved.  He likely has some underlying cognitive impairment/dementia.  CT head was found to be unremarkable. 2. Urinary tract infection.  Low-grade temperature on admission.  Urinalysis with greater than 50 WBCs.  Urine culture growing 50,000 colonies of yeast.  Seen by urology who recommended 5 days of Diflucan. 3. Left lower extremity acute DVT.  This is being treated with Eliquis. 4. Gross hematuria.  Seen by urology who felt that this was secondary to his urinary tract infection.  He recently had cystoscopy done in 05/2019 that did not show any signs of bladder cancer.  It was recommended to hold anticoagulation until hematuria has resolved and resume on 12/28.  Currently hematuria has resolved so anticoagulation will be resumed.   5. Dysphagia, likely secondary to Schatzki ring.  He is status post esophageal dilatation in the past.  Seen by speech therapy who recommended dysphagia 3 diet.  Will need outpatient follow-up with GI. 6. Chronic kidney disease stage IIIb.  Creatinine stable. 7. BPH.  Continue Proscar.  Patient was seen by physical therapy, who recommended Home health, which was arranged. On the day of the discharge the patient's vitals were stable, and no other acute medical condition were reported by patient. the patient was felt safe to be discharge at Home with Home health.  Consultants: Urology Procedures: none  DISCHARGE MEDICATION: Allergies as of 08/15/2019   No Known Allergies     Medication List  STOP taking these medications   aspirin 81 MG chewable tablet     TAKE these medications   Apixaban Starter Pack 5 MG Tbpk Commonly known as: ELIQUIS STARTER PACK Take as directed on package: start with two-5mg  tablets twice daily for 3 days. On 08/18/2019, switch to one-5mg  tablet twice daily.   Centrum Silver tablet Take 1 tablet by mouth daily.   COQ10 PO Take 1 tablet by mouth at  bedtime.   feeding supplement (ENSURE ENLIVE) Liqd Take 237 mLs by mouth 2 (two) times daily between meals.   feeding supplement (PRO-STAT SUGAR FREE 64) Liqd Take 30 mLs by mouth 2 (two) times daily.   finasteride 5 MG tablet Commonly known as: PROSCAR Take 5 mg by mouth at bedtime.   gabapentin 300 MG capsule Commonly known as: NEURONTIN Take 300 mg by mouth daily as needed (pain).   Garlic 0093 MG Tbec Take 2,000 mg by mouth daily.   simvastatin 10 MG tablet Commonly known as: ZOCOR Take 10 mg by mouth at bedtime.   Vitamin B-12 5000 MCG Tbdp Take 5,000 mcg by mouth daily.   Vitamin D-3 125 MCG (5000 UT) Tabs Take 5,000 mg by mouth daily.   zolpidem 5 MG tablet Commonly known as: AMBIEN Take 1 tablet (5 mg total) by mouth at bedtime as needed for sleep.     ASK your doctor about these medications   fluconazole 100 MG tablet Commonly known as: DIFLUCAN Take 1 tablet (100 mg total) by mouth daily for 1 day. Ask about: Should I take this medication?      No Known Allergies Discharge Instructions    DIET DYS 2   Complete by: As directed    Fluid consistency: Thin   Discharge instructions   Complete by: As directed    It is important that you read the given instructions as well as go over your medication list with RN to help you understand your care after this hospitalization.  Please follow-up with PCP in 1-2 weeks.  Please note that NO REFILLS for any discharge medications will be authorized once you are discharged, as it is imperative that you return to your primary care physician (or establish a relationship with a primary care physician if you do not have one) for your aftercare needs so that they can reassess your need for medications and monitor your lab values.  Please request your primary care physician to go over all Hospital Tests and Procedure/Radiological results at the follow up. Please get all Hospital records sent to your PCP by signing hospital  release before you go home.    Do not take more than prescribed Pain, Sleep and Anxiety Medications.  You were cared for by a hospitalist during your hospital stay. If you have any questions about your discharge medications or the care you received while you were in the hospital after you are discharged, you can call the unit @UNIT @ you were admitted to and ask to speak with the hospitalist Berle Mull. Ask for Hospitalist on call if the hospitalist that took care of you is not available.   Once you are discharged, your primary care physician will handle any further medical issues.  You Must read complete instructions/literature along with all the possible adverse reactions/side effects for all the Medicines you take and that have been prescribed to you. Take any new Medicines after you have completely understood and accept all the possible adverse reactions/side effects.  If you have smoked or chewed Tobacco in  the last 2 yrs please STOP smoking STOP any Recreational drug use.  If you drink alcohol, please safely STOP the use. Do not drive, operating heavy machinery, perform activities at heights, swimming or participation in water activities or provide baby sitting services under influence.  Wear Seat belts while driving.   Encourage fluids   Complete by: As directed    Increase activity slowly   Complete by: As directed      Discharge Exam: Filed Weights   08/13/19 0536 08/14/19 0454 08/15/19 0300  Weight: 83.9 kg 79.9 kg 79.7 kg   Vitals:   08/14/19 2150 08/15/19 0400  BP: 110/65 (!) 141/81  Pulse: 69 69  Resp: 20 18  Temp: 98 F (36.7 C) 98.4 F (36.9 C)  SpO2: 91% 90%   General: Appear in mild distress, no Rash; Oral Mucosa Clear, moist. no Abnormal Mass Or lumps Cardiovascular: S1 and S2 Present, no Murmur, Respiratory: normal respiratory effort, Bilateral Air entry present and Clear to Auscultation, no Crackles, no wheezes Abdomen: Bowel Sound present, Soft and no  tenderness, no hernia Extremities: no Pedal edema, no calf tenderness Neurology: alert and oriented to time, place, and person affect appropriate.  The results of significant diagnostics from this hospitalization (including imaging, microbiology, ancillary and laboratory) are listed below for reference.    Significant Diagnostic Studies: CT HEAD WO CONTRAST  Result Date: 08/08/2019 CLINICAL DATA:  Altered mental status EXAM: CT HEAD WITHOUT CONTRAST TECHNIQUE: Contiguous axial images were obtained from the base of the skull through the vertex without intravenous contrast. COMPARISON:  February 11, 2015 FINDINGS: Brain: No evidence of acute territorial infarction, hemorrhage, hydrocephalus,extra-axial collection or mass lesion/mass effect. There is dilatation the ventricles and sulci consistent with age-related atrophy. Low-attenuation changes in the deep white matter consistent with small vessel ischemia. Vascular: No hyperdense vessel or unexpected calcification. Skull: The skull is intact. No fracture or focal lesion identified. Sinuses/Orbits: The visualized paranasal sinuses and mastoid air cells are clear. The orbits and globes intact. Other: None IMPRESSION: No acute intracranial abnormality. Findings consistent with age related atrophy and chronic small vessel ischemia Electronically Signed   By: Prudencio Pair M.D.   On: 08/08/2019 20:53   US RENAL  Result Date: 08/12/2019 CLINICAL DATA:  Hematuria. EXAM: RENAL / URINARY TRACT ULTRASOUND COMPLETE COMPARISON:  Renal ultrasound 05/01/2019. Abdominopelvic CT 04/21/2019. FINDINGS: Right Kidney: Renal measurements: 8.1 x 5.0 x 5.8 cm = volume: 124.1 mL. There is renal cortical thinning without focal abnormality. Moderate dilatation of the right renal pelvis and mild caliectasis, similar to previous studies. Left Kidney: Renal measurements: 9.6 x 5.5 x 4.3 cm = volume: 117.7 mL. Cortical thinning and prominent renal sinus fat. No hydronephrosis or focal  cortical lesion identified. Bladder: There is a large heterogeneous mass within the urinary bladder, measuring up to 7.6 cm in diameter. This demonstrates echogenic and hypoechoic components, but no obvious blood flow on limited Doppler evaluation. There are no shadowing components to correspond with the calculus seen on previous studies. Other: None. IMPRESSION: 1. Heterogeneous mass within the urinary bladder shows no obvious internal blood flow and could reflect blood clot. The bladder calculi demonstrated previously are not clearly visualized. Cystoscopy warranted to exclude neoplasm if not recently performed. 2. Right extrarenal pelvis and mild hydronephrosis, similar to previous studies. 3. Bilateral renal cortical thinning. No focal cortical lesions visualized by ultrasound. Electronically Signed   By: Richardean Sale M.D.   On: 08/12/2019 12:54   DG Chest [facility]  Result Date: 08/08/2019 CLINICAL DATA:  Cough. EXAM: PORTABLE CHEST 1 VIEW COMPARISON:  04/30/2019 FINDINGS: Cardiomediastinal contours are stable within normal limits. Lungs are clear aside from a subtle opacity at the left lung base. No signs of pleural effusion or dense consolidation. Signs of mitral annular calcification. Visualized skeletal structures are unremarkable. IMPRESSION: Basilar atelectasis.  No acute cardiopulmonary disease. Electronically Signed   By: Zetta Bills M.D.   On: 08/08/2019 18:22   ECHOCARDIOGRAM COMPLETE  Result Date: 08/09/2019   ECHOCARDIOGRAM REPORT   Patient Name:   ALEM FAHL Date of Exam: 08/09/2019 Medical Rec #:  [id number]    Height:       68.0 in Accession #:    [id number]   Weight:       177.5 lb Date of Birth:  [birth year]    BSA:          1.94 m Patient Age:    86 years     BP:           147/99 mmHg Patient Gender: M            HR:           75 bpm. Exam Location:  Inpatient Procedure: 2D Echo, Color Doppler and Cardiac Doppler Indications:    R06.9 DOE  History:        Patient has prior  history of Echocardiogram examinations, most                 recent 02/08/2015. Risk Factors:Hypertension and Dyslipidemia.  Sonographer:    Raquel Sarna Senior RDCS Referring Phys: [id number] [place]  1. Left ventricular ejection fraction, by visual estimation, is 55 to 60%. The left ventricle has normal function. There is mildly increased left ventricular hypertrophy.  2. Left ventricular diastolic parameters are consistent with Grade I diastolic dysfunction (impaired relaxation).  3. Mildly dilated left ventricular internal cavity size.  4. The left ventricle has no regional wall motion abnormalities.  5. Global right ventricle has normal systolic function.The right ventricular size is normal.  6. Left atrial size was mildly dilated.  7. Right atrial size was normal.  8. Moderate mitral annular calcification.  9. The mitral valve is normal in structure. No evidence of mitral valve regurgitation. No evidence of mitral stenosis. 10. The tricuspid valve is normal in structure. 11. The aortic valve is tricuspid. Aortic valve regurgitation is not visualized. Mild aortic valve sclerosis without stenosis. 12. The pulmonic valve was not well visualized. Pulmonic valve regurgitation is trivial. 13. The inferior vena cava is normal in size with greater than 50% respiratory variability, suggesting right atrial pressure of 3 mmHg. 14. Technically difficult; normal LV systolic function; mild LVH; mild LVE; grade 1 diastolic dysfunction; mild LAE. FINDINGS  Left Ventricle: Left ventricular ejection fraction, by visual estimation, is 55 to 60%. The left ventricle has normal function. The left ventricle has no regional wall motion abnormalities. The left ventricular internal cavity size was mildly dilated left ventricle. There is mildly increased left ventricular hypertrophy. Left ventricular diastolic parameters are consistent with Grade I diastolic dysfunction (impaired relaxation). Normal left atrial pressure.  Right Ventricle: The right ventricular size is normal.Global RV systolic function is has normal systolic function. Left Atrium: Left atrial size was mildly dilated. Right Atrium: Right atrial size was normal in size Pericardium: There is no evidence of pericardial effusion. Mitral Valve: The mitral valve is normal in structure. Moderate mitral annular calcification. No evidence of mitral valve regurgitation. No evidence  of mitral valve stenosis by observation. Tricuspid Valve: The tricuspid valve is normal in structure. Tricuspid valve regurgitation is trivial. Aortic Valve: The aortic valve is tricuspid. Aortic valve regurgitation is not visualized. Mild aortic valve sclerosis is present, with no evidence of aortic valve stenosis. Pulmonic Valve: The pulmonic valve was not well visualized. Pulmonic valve regurgitation is trivial. Pulmonic regurgitation is trivial. Aorta: The aortic root is normal in size and structure. Venous: The inferior vena cava is normal in size with greater than 50% respiratory variability, suggesting right atrial pressure of 3 mmHg.  Additional Comments: Technically difficult; normal LV systolic function; mild LVH; mild LVE; grade 1 diastolic dysfunction; mild LAE.  LEFT VENTRICLE PLAX 2D LVIDd:         5.50 cm  Diastology LVIDs:         3.90 cm  LV e' lateral:   6.85 cm/s LV PW:         1.10 cm  LV E/e' lateral: 9.3 LV IVS:        1.20 cm  LV e' medial:    5.87 cm/s LVOT diam:     2.00 cm  LV E/e' medial:  10.8 LV SV:         82 ml LV SV Index:   41.16 LVOT Area:     3.14 cm  RIGHT VENTRICLE RV S prime:     15.90 cm/s TAPSE (M-mode): 2.5 cm LEFT ATRIUM             Index       RIGHT ATRIUM           Index LA diam:        4.10 cm 2.11 cm/m  RA Area:     18.30 cm LA Vol (A2C):   84.9 ml 43.71 ml/m RA Volume:   44.80 ml  23.06 ml/m LA Vol (A4C):   60.9 ml 31.35 ml/m LA Biplane Vol: 72.1 ml 37.12 ml/m  AORTIC VALVE LVOT Vmax:   85.00 cm/s LVOT Vmean:  56.500 cm/s LVOT VTI:    0.147 m   AORTA Ao Root diam: 3.70 cm Ao Asc diam:  3.70 cm MITRAL VALVE MV Area (PHT): 4.57 cm              SHUNTS MV PHT:        48.14 msec            Systemic VTI:  0.15 m MV Decel Time: 166 msec              Systemic Diam: 2.00 cm MV E velocity: 63.40 cm/s  103 cm/s MV A velocity: 111.00 cm/s 70.3 cm/s MV E/A ratio:  0.57        1.5  Kirk Ruths MD Electronically signed by Kirk Ruths MD Signature Date/Time: 08/09/2019/2:53:06 PM    Final    VAS [place] LOWER EXTREMITY VENOUS (DVT) (ONLY [facility] & [facility] 7a-7p)  Result Date: 08/09/2019  Lower Venous Study Indications: Swelling.  Risk Factors: None identified. Limitations: Poor ultrasound/tissue interface. Comparison Study: No prior studies. Performing Technologist: Oliver Hum RVT  Examination Guidelines: A complete evaluation includes B-mode imaging, spectral Doppler, color Doppler, and power Doppler as needed of all accessible portions of each vessel. Bilateral testing is considered an integral part of a complete examination. Limited examinations for reoccurring indications may be performed as noted.  +---------+---------------+---------+-----------+----------+--------------+ RIGHT    CompressibilityPhasicitySpontaneityPropertiesThrombus Aging +---------+---------------+---------+-----------+----------+--------------+ CFV      Full  Yes      Yes                                 +---------+---------------+---------+-----------+----------+--------------+ SFJ      Full                                                        +---------+---------------+---------+-----------+----------+--------------+ FV Prox  Full                                                        +---------+---------------+---------+-----------+----------+--------------+ FV Mid   Full                                                        +---------+---------------+---------+-----------+----------+--------------+ FV DistalFull                                                         +---------+---------------+---------+-----------+----------+--------------+ PFV      Full                                                        +---------+---------------+---------+-----------+----------+--------------+ POP      Full           Yes      Yes                                 +---------+---------------+---------+-----------+----------+--------------+ PTV      Full                                                        +---------+---------------+---------+-----------+----------+--------------+ PERO     Full                                                        +---------+---------------+---------+-----------+----------+--------------+   +---------+---------------+---------+-----------+----------+--------------+ LEFT     CompressibilityPhasicitySpontaneityPropertiesThrombus Aging +---------+---------------+---------+-----------+----------+--------------+ CFV      Full           Yes      Yes                                 +---------+---------------+---------+-----------+----------+--------------+ SFJ  Full                                                        +---------+---------------+---------+-----------+----------+--------------+ FV Prox  Full                                                        +---------+---------------+---------+-----------+----------+--------------+ FV Mid   Full                                                        +---------+---------------+---------+-----------+----------+--------------+ FV DistalFull                                                        +---------+---------------+---------+-----------+----------+--------------+ PFV      Full                                                        +---------+---------------+---------+-----------+----------+--------------+ POP      Full           Yes      Yes                                  +---------+---------------+---------+-----------+----------+--------------+ PTV      Full                                                        +---------+---------------+---------+-----------+----------+--------------+ PERO     Full                                                        +---------+---------------+---------+-----------+----------+--------------+ Gastroc  None                                         Acute          +---------+---------------+---------+-----------+----------+--------------+     Summary: Right: There is no evidence of deep vein thrombosis in the lower extremity. However, portions of this examination were limited- see technologist comments above. No cystic structure found in the popliteal fossa. Left: Findings consistent with acute deep vein thrombosis involving the left gastrocnemius veins. No cystic structure found in the popliteal fossa.  *See table(s) above for measurements  and observations. Electronically signed by Curt Jews MD on 08/09/2019 at 4:07:15 PM.    Final     Microbiology: No results found for this or any previous visit (from the past 240 hour(s)).   Labs: CBC: No results for input(s): WBC, NEUTROABS, HGB, HCT, MCV, PLT in the last 168 hours. Basic Metabolic Panel: No results for input(s): NA, K, CL, CO2, GLUCOSE, BUN, CREATININE, CALCIUM, MG, PHOS in the last 168 hours. Liver Function Tests: No results for input(s): AST, ALT, ALKPHOS, BILITOT, PROT, ALBUMIN in the last 168 hours. No results for input(s): LIPASE, AMYLASE in the last 168 hours. No results for input(s): AMMONIA in the last 168 hours. Cardiac Enzymes: No results for input(s): CKTOTAL, CKMB, CKMBINDEX, TROPONINI in the last 168 hours. BNP (last 3 results) Recent Labs    05/03/19 0240 05/04/19 0236 08/09/19 0436  BNP 202.8* 292.8* 197.5*   CBG: No results for input(s): GLUCAP in the last 168 hours.  Time spent: 35 minutes  Signed:  Berle Mull  [facility] 08/15/2019 7:52 PM

## 2019-08-23 DIAGNOSIS — I82402 Acute embolism and thrombosis of unspecified deep veins of left lower extremity: Secondary | ICD-10-CM | POA: Diagnosis not present

## 2019-08-23 DIAGNOSIS — M549 Dorsalgia, unspecified: Secondary | ICD-10-CM | POA: Diagnosis not present

## 2019-08-23 DIAGNOSIS — G9340 Encephalopathy, unspecified: Secondary | ICD-10-CM | POA: Diagnosis not present

## 2019-08-23 DIAGNOSIS — N400 Enlarged prostate without lower urinary tract symptoms: Secondary | ICD-10-CM | POA: Diagnosis not present

## 2019-08-23 DIAGNOSIS — R1310 Dysphagia, unspecified: Secondary | ICD-10-CM | POA: Diagnosis not present

## 2019-08-23 DIAGNOSIS — G8929 Other chronic pain: Secondary | ICD-10-CM | POA: Diagnosis not present

## 2019-08-23 DIAGNOSIS — N390 Urinary tract infection, site not specified: Secondary | ICD-10-CM | POA: Diagnosis not present

## 2019-08-23 DIAGNOSIS — I129 Hypertensive chronic kidney disease with stage 1 through stage 4 chronic kidney disease, or unspecified chronic kidney disease: Secondary | ICD-10-CM | POA: Diagnosis not present

## 2019-08-23 DIAGNOSIS — N1832 Chronic kidney disease, stage 3b: Secondary | ICD-10-CM | POA: Diagnosis not present

## 2019-08-24 DIAGNOSIS — I82402 Acute embolism and thrombosis of unspecified deep veins of left lower extremity: Secondary | ICD-10-CM | POA: Diagnosis not present

## 2019-08-24 DIAGNOSIS — N400 Enlarged prostate without lower urinary tract symptoms: Secondary | ICD-10-CM | POA: Diagnosis not present

## 2019-08-24 DIAGNOSIS — G9340 Encephalopathy, unspecified: Secondary | ICD-10-CM | POA: Diagnosis not present

## 2019-08-24 DIAGNOSIS — N1832 Chronic kidney disease, stage 3b: Secondary | ICD-10-CM | POA: Diagnosis not present

## 2019-08-24 DIAGNOSIS — G8929 Other chronic pain: Secondary | ICD-10-CM | POA: Diagnosis not present

## 2019-08-24 DIAGNOSIS — M549 Dorsalgia, unspecified: Secondary | ICD-10-CM | POA: Diagnosis not present

## 2019-08-24 DIAGNOSIS — I129 Hypertensive chronic kidney disease with stage 1 through stage 4 chronic kidney disease, or unspecified chronic kidney disease: Secondary | ICD-10-CM | POA: Diagnosis not present

## 2019-08-24 DIAGNOSIS — R1310 Dysphagia, unspecified: Secondary | ICD-10-CM | POA: Diagnosis not present

## 2019-08-24 DIAGNOSIS — N390 Urinary tract infection, site not specified: Secondary | ICD-10-CM | POA: Diagnosis not present

## 2019-08-25 DIAGNOSIS — N1832 Chronic kidney disease, stage 3b: Secondary | ICD-10-CM | POA: Diagnosis not present

## 2019-08-25 DIAGNOSIS — I129 Hypertensive chronic kidney disease with stage 1 through stage 4 chronic kidney disease, or unspecified chronic kidney disease: Secondary | ICD-10-CM | POA: Diagnosis not present

## 2019-08-25 DIAGNOSIS — G8929 Other chronic pain: Secondary | ICD-10-CM | POA: Diagnosis not present

## 2019-08-25 DIAGNOSIS — I82402 Acute embolism and thrombosis of unspecified deep veins of left lower extremity: Secondary | ICD-10-CM | POA: Diagnosis not present

## 2019-08-25 DIAGNOSIS — M549 Dorsalgia, unspecified: Secondary | ICD-10-CM | POA: Diagnosis not present

## 2019-08-25 DIAGNOSIS — N400 Enlarged prostate without lower urinary tract symptoms: Secondary | ICD-10-CM | POA: Diagnosis not present

## 2019-08-25 DIAGNOSIS — G9340 Encephalopathy, unspecified: Secondary | ICD-10-CM | POA: Diagnosis not present

## 2019-08-25 DIAGNOSIS — N390 Urinary tract infection, site not specified: Secondary | ICD-10-CM | POA: Diagnosis not present

## 2019-08-25 DIAGNOSIS — R1310 Dysphagia, unspecified: Secondary | ICD-10-CM | POA: Diagnosis not present

## 2019-08-29 ENCOUNTER — Other Ambulatory Visit: Payer: Self-pay | Admitting: Gastroenterology

## 2019-08-29 DIAGNOSIS — K222 Esophageal obstruction: Secondary | ICD-10-CM | POA: Diagnosis not present

## 2019-08-30 DIAGNOSIS — G9340 Encephalopathy, unspecified: Secondary | ICD-10-CM | POA: Diagnosis not present

## 2019-08-30 DIAGNOSIS — N390 Urinary tract infection, site not specified: Secondary | ICD-10-CM | POA: Diagnosis not present

## 2019-08-30 DIAGNOSIS — I129 Hypertensive chronic kidney disease with stage 1 through stage 4 chronic kidney disease, or unspecified chronic kidney disease: Secondary | ICD-10-CM | POA: Diagnosis not present

## 2019-08-30 DIAGNOSIS — N1832 Chronic kidney disease, stage 3b: Secondary | ICD-10-CM | POA: Diagnosis not present

## 2019-08-30 DIAGNOSIS — I82402 Acute embolism and thrombosis of unspecified deep veins of left lower extremity: Secondary | ICD-10-CM | POA: Diagnosis not present

## 2019-08-30 DIAGNOSIS — G8929 Other chronic pain: Secondary | ICD-10-CM | POA: Diagnosis not present

## 2019-08-30 DIAGNOSIS — M549 Dorsalgia, unspecified: Secondary | ICD-10-CM | POA: Diagnosis not present

## 2019-08-30 DIAGNOSIS — N400 Enlarged prostate without lower urinary tract symptoms: Secondary | ICD-10-CM | POA: Diagnosis not present

## 2019-08-30 DIAGNOSIS — R1310 Dysphagia, unspecified: Secondary | ICD-10-CM | POA: Diagnosis not present

## 2019-09-06 DIAGNOSIS — I82402 Acute embolism and thrombosis of unspecified deep veins of left lower extremity: Secondary | ICD-10-CM | POA: Diagnosis not present

## 2019-09-06 DIAGNOSIS — N390 Urinary tract infection, site not specified: Secondary | ICD-10-CM | POA: Diagnosis not present

## 2019-09-06 DIAGNOSIS — G9340 Encephalopathy, unspecified: Secondary | ICD-10-CM | POA: Diagnosis not present

## 2019-09-06 DIAGNOSIS — N1832 Chronic kidney disease, stage 3b: Secondary | ICD-10-CM | POA: Diagnosis not present

## 2019-09-06 DIAGNOSIS — I129 Hypertensive chronic kidney disease with stage 1 through stage 4 chronic kidney disease, or unspecified chronic kidney disease: Secondary | ICD-10-CM | POA: Diagnosis not present

## 2019-09-06 DIAGNOSIS — G8929 Other chronic pain: Secondary | ICD-10-CM | POA: Diagnosis not present

## 2019-09-06 DIAGNOSIS — N400 Enlarged prostate without lower urinary tract symptoms: Secondary | ICD-10-CM | POA: Diagnosis not present

## 2019-09-06 DIAGNOSIS — R1310 Dysphagia, unspecified: Secondary | ICD-10-CM | POA: Diagnosis not present

## 2019-09-06 DIAGNOSIS — M549 Dorsalgia, unspecified: Secondary | ICD-10-CM | POA: Diagnosis not present

## 2019-09-10 ENCOUNTER — Inpatient Hospital Stay (HOSPITAL_COMMUNITY)
Admission: EM | Admit: 2019-09-10 | Discharge: 2019-09-26 | DRG: 871 | Disposition: A | Discharge status: Hospice | Payer: Medicare Other | Attending: Internal Medicine | Admitting: Internal Medicine

## 2019-09-10 ENCOUNTER — Emergency Department (HOSPITAL_COMMUNITY): Payer: Medicare Other

## 2019-09-10 ENCOUNTER — Other Ambulatory Visit: Payer: Self-pay

## 2019-09-10 DIAGNOSIS — Z20822 Contact with and (suspected) exposure to covid-19: Secondary | ICD-10-CM | POA: Diagnosis present

## 2019-09-10 DIAGNOSIS — I82502 Chronic embolism and thrombosis of unspecified deep veins of left lower extremity: Secondary | ICD-10-CM | POA: Diagnosis present

## 2019-09-10 DIAGNOSIS — R410 Disorientation, unspecified: Secondary | ICD-10-CM | POA: Diagnosis not present

## 2019-09-10 DIAGNOSIS — Z515 Encounter for palliative care: Secondary | ICD-10-CM

## 2019-09-10 DIAGNOSIS — R0902 Hypoxemia: Secondary | ICD-10-CM | POA: Diagnosis not present

## 2019-09-10 DIAGNOSIS — Z6824 Body mass index (BMI) 24.0-24.9, adult: Secondary | ICD-10-CM | POA: Diagnosis not present

## 2019-09-10 DIAGNOSIS — Z8619 Personal history of other infectious and parasitic diseases: Secondary | ICD-10-CM

## 2019-09-10 DIAGNOSIS — Z9981 Dependence on supplemental oxygen: Secondary | ICD-10-CM | POA: Diagnosis not present

## 2019-09-10 DIAGNOSIS — Z79899 Other long term (current) drug therapy: Secondary | ICD-10-CM

## 2019-09-10 DIAGNOSIS — N1832 Chronic kidney disease, stage 3b: Secondary | ICD-10-CM | POA: Diagnosis present

## 2019-09-10 DIAGNOSIS — F17220 Nicotine dependence, chewing tobacco, uncomplicated: Secondary | ICD-10-CM | POA: Diagnosis present

## 2019-09-10 DIAGNOSIS — R5381 Other malaise: Secondary | ICD-10-CM | POA: Diagnosis present

## 2019-09-10 DIAGNOSIS — Z820 Family history of epilepsy and other diseases of the nervous system: Secondary | ICD-10-CM | POA: Diagnosis not present

## 2019-09-10 DIAGNOSIS — I5032 Chronic diastolic (congestive) heart failure: Secondary | ICD-10-CM | POA: Diagnosis present

## 2019-09-10 DIAGNOSIS — Z87891 Personal history of nicotine dependence: Secondary | ICD-10-CM | POA: Diagnosis not present

## 2019-09-10 DIAGNOSIS — M2550 Pain in unspecified joint: Secondary | ICD-10-CM | POA: Diagnosis not present

## 2019-09-10 DIAGNOSIS — J690 Pneumonitis due to inhalation of food and vomit: Secondary | ICD-10-CM | POA: Diagnosis present

## 2019-09-10 DIAGNOSIS — A419 Sepsis, unspecified organism: Principal | ICD-10-CM

## 2019-09-10 DIAGNOSIS — R0602 Shortness of breath: Secondary | ICD-10-CM

## 2019-09-10 DIAGNOSIS — Z934 Other artificial openings of gastrointestinal tract status: Secondary | ICD-10-CM | POA: Diagnosis not present

## 2019-09-10 DIAGNOSIS — B1910 Unspecified viral hepatitis B without hepatic coma: Secondary | ICD-10-CM | POA: Diagnosis not present

## 2019-09-10 DIAGNOSIS — I130 Hypertensive heart and chronic kidney disease with heart failure and stage 1 through stage 4 chronic kidney disease, or unspecified chronic kidney disease: Secondary | ICD-10-CM | POA: Diagnosis present

## 2019-09-10 DIAGNOSIS — Z9841 Cataract extraction status, right eye: Secondary | ICD-10-CM

## 2019-09-10 DIAGNOSIS — N400 Enlarged prostate without lower urinary tract symptoms: Secondary | ICD-10-CM | POA: Diagnosis present

## 2019-09-10 DIAGNOSIS — R000 Tachycardia, unspecified: Secondary | ICD-10-CM | POA: Diagnosis not present

## 2019-09-10 DIAGNOSIS — M7989 Other specified soft tissue disorders: Secondary | ICD-10-CM | POA: Diagnosis not present

## 2019-09-10 DIAGNOSIS — F05 Delirium due to known physiological condition: Secondary | ICD-10-CM | POA: Diagnosis present

## 2019-09-10 DIAGNOSIS — Z8249 Family history of ischemic heart disease and other diseases of the circulatory system: Secondary | ICD-10-CM | POA: Diagnosis not present

## 2019-09-10 DIAGNOSIS — Z741 Need for assistance with personal care: Secondary | ICD-10-CM | POA: Diagnosis not present

## 2019-09-10 DIAGNOSIS — Z8546 Personal history of malignant neoplasm of prostate: Secondary | ICD-10-CM

## 2019-09-10 DIAGNOSIS — Z8673 Personal history of transient ischemic attack (TIA), and cerebral infarction without residual deficits: Secondary | ICD-10-CM

## 2019-09-10 DIAGNOSIS — J9601 Acute respiratory failure with hypoxia: Secondary | ICD-10-CM

## 2019-09-10 DIAGNOSIS — J80 Acute respiratory distress syndrome: Secondary | ICD-10-CM | POA: Diagnosis not present

## 2019-09-10 DIAGNOSIS — Z7401 Bed confinement status: Secondary | ICD-10-CM | POA: Diagnosis not present

## 2019-09-10 DIAGNOSIS — R509 Fever, unspecified: Secondary | ICD-10-CM | POA: Diagnosis not present

## 2019-09-10 DIAGNOSIS — Z9842 Cataract extraction status, left eye: Secondary | ICD-10-CM

## 2019-09-10 DIAGNOSIS — Z66 Do not resuscitate: Secondary | ICD-10-CM | POA: Diagnosis not present

## 2019-09-10 DIAGNOSIS — H9190 Unspecified hearing loss, unspecified ear: Secondary | ICD-10-CM | POA: Diagnosis present

## 2019-09-10 DIAGNOSIS — I10 Essential (primary) hypertension: Secondary | ICD-10-CM | POA: Diagnosis not present

## 2019-09-10 DIAGNOSIS — K222 Esophageal obstruction: Secondary | ICD-10-CM | POA: Diagnosis present

## 2019-09-10 DIAGNOSIS — N183 Chronic kidney disease, stage 3 unspecified: Secondary | ICD-10-CM | POA: Diagnosis present

## 2019-09-10 DIAGNOSIS — R32 Unspecified urinary incontinence: Secondary | ICD-10-CM | POA: Diagnosis not present

## 2019-09-10 DIAGNOSIS — Z961 Presence of intraocular lens: Secondary | ICD-10-CM | POA: Diagnosis present

## 2019-09-10 DIAGNOSIS — J189 Pneumonia, unspecified organism: Secondary | ICD-10-CM

## 2019-09-10 DIAGNOSIS — E46 Unspecified protein-calorie malnutrition: Secondary | ICD-10-CM | POA: Diagnosis not present

## 2019-09-10 DIAGNOSIS — E785 Hyperlipidemia, unspecified: Secondary | ICD-10-CM | POA: Diagnosis present

## 2019-09-10 DIAGNOSIS — Y95 Nosocomial condition: Secondary | ICD-10-CM | POA: Diagnosis not present

## 2019-09-10 DIAGNOSIS — Z7189 Other specified counseling: Secondary | ICD-10-CM | POA: Diagnosis not present

## 2019-09-10 DIAGNOSIS — Z86718 Personal history of other venous thrombosis and embolism: Secondary | ICD-10-CM

## 2019-09-10 DIAGNOSIS — Z801 Family history of malignant neoplasm of trachea, bronchus and lung: Secondary | ICD-10-CM

## 2019-09-10 DIAGNOSIS — I129 Hypertensive chronic kidney disease with stage 1 through stage 4 chronic kidney disease, or unspecified chronic kidney disease: Secondary | ICD-10-CM | POA: Diagnosis not present

## 2019-09-10 DIAGNOSIS — R627 Adult failure to thrive: Secondary | ICD-10-CM | POA: Diagnosis present

## 2019-09-10 DIAGNOSIS — Z806 Family history of leukemia: Secondary | ICD-10-CM

## 2019-09-10 DIAGNOSIS — R52 Pain, unspecified: Secondary | ICD-10-CM | POA: Diagnosis not present

## 2019-09-10 DIAGNOSIS — Z7901 Long term (current) use of anticoagulants: Secondary | ICD-10-CM

## 2019-09-10 DIAGNOSIS — R1310 Dysphagia, unspecified: Secondary | ICD-10-CM

## 2019-09-10 DIAGNOSIS — R6520 Severe sepsis without septic shock: Secondary | ICD-10-CM | POA: Diagnosis not present

## 2019-09-10 DIAGNOSIS — F0390 Unspecified dementia without behavioral disturbance: Secondary | ICD-10-CM | POA: Diagnosis present

## 2019-09-10 DIAGNOSIS — Z209 Contact with and (suspected) exposure to unspecified communicable disease: Secondary | ICD-10-CM | POA: Diagnosis not present

## 2019-09-10 DIAGNOSIS — J181 Lobar pneumonia, unspecified organism: Secondary | ICD-10-CM | POA: Diagnosis not present

## 2019-09-10 DIAGNOSIS — N189 Chronic kidney disease, unspecified: Secondary | ICD-10-CM | POA: Diagnosis not present

## 2019-09-10 DIAGNOSIS — I444 Left anterior fascicular block: Secondary | ICD-10-CM | POA: Diagnosis present

## 2019-09-10 LAB — LACTATE DEHYDROGENASE: LDH: 137 U/L (ref 98–192)

## 2019-09-10 LAB — COMPREHENSIVE METABOLIC PANEL
ALT: 15 U/L (ref 0–44)
AST: 20 U/L (ref 15–41)
Albumin: 3 g/dL — ABNORMAL LOW (ref 3.5–5.0)
Alkaline Phosphatase: 57 U/L (ref 38–126)
Anion gap: 11 (ref 5–15)
BUN: 18 mg/dL (ref 8–23)
CO2: 23 mmol/L (ref 22–32)
Calcium: 9.2 mg/dL (ref 8.9–10.3)
Chloride: 106 mmol/L (ref 98–111)
Creatinine, Ser: 1.75 mg/dL — ABNORMAL HIGH (ref 0.61–1.24)
GFR calc Af Amer: 39 mL/min — ABNORMAL LOW (ref >60)
GFR calc non Af Amer: 34 mL/min — ABNORMAL LOW (ref >60)
Glucose, Bld: 157 mg/dL — ABNORMAL HIGH (ref 70–99)
Potassium: 3.9 mmol/L (ref 3.5–5.1)
Sodium: 140 mmol/L (ref 135–145)
Total Bilirubin: 0.6 mg/dL (ref 0.3–1.2)
Total Protein: 5.6 g/dL — ABNORMAL LOW (ref 6.5–8.1)

## 2019-09-10 LAB — CBC WITH DIFFERENTIAL/PLATELET
Abs Immature Granulocytes: 0.03 10*3/uL (ref 0.00–0.07)
Basophils Absolute: 0 10*3/uL (ref 0.0–0.1)
Basophils Relative: 0 %
Eosinophils Absolute: 0.4 10*3/uL (ref 0.0–0.5)
Eosinophils Relative: 4 %
HCT: 40.8 % (ref 39.0–52.0)
Hemoglobin: 12.7 g/dL — ABNORMAL LOW (ref 13.0–17.0)
Immature Granulocytes: 0 %
Lymphocytes Relative: 7 %
Lymphs Abs: 0.7 10*3/uL (ref 0.7–4.0)
MCH: 28.2 pg (ref 26.0–34.0)
MCHC: 31.1 g/dL (ref 30.0–36.0)
MCV: 90.7 fL (ref 80.0–100.0)
Monocytes Absolute: 0.5 10*3/uL (ref 0.1–1.0)
Monocytes Relative: 5 %
Neutro Abs: 8.3 10*3/uL — ABNORMAL HIGH (ref 1.7–7.7)
Neutrophils Relative %: 84 %
Platelets: 279 10*3/uL (ref 150–400)
RBC: 4.5 MIL/uL (ref 4.22–5.81)
RDW: 15.6 % — ABNORMAL HIGH (ref 11.5–15.5)
WBC: 10 10*3/uL (ref 4.0–10.5)
nRBC: 0 % (ref 0.0–0.2)

## 2019-09-10 LAB — PROCALCITONIN: Procalcitonin: 0.14 ng/mL

## 2019-09-10 LAB — POC SARS CORONAVIRUS 2 AG -  ED: SARS Coronavirus 2 Ag: NEGATIVE

## 2019-09-10 LAB — FIBRINOGEN: Fibrinogen: 530 mg/dL — ABNORMAL HIGH (ref 210–475)

## 2019-09-10 LAB — TRIGLYCERIDES: Triglycerides: 111 mg/dL (ref <150)

## 2019-09-10 LAB — LACTIC ACID, PLASMA: Lactic Acid, Venous: 2 mmol/L (ref 0.5–1.9)

## 2019-09-10 LAB — D-DIMER, QUANTITATIVE: D-Dimer, Quant: 0.53 ug/mL-FEU — ABNORMAL HIGH (ref 0.00–0.50)

## 2019-09-10 MED ORDER — SODIUM CHLORIDE 0.9 % IV SOLN
2.0000 g | INTRAVENOUS | Status: DC
  Filled 2019-09-10: qty 20

## 2019-09-10 MED ORDER — SODIUM CHLORIDE 0.9 % IV SOLN
500.0000 mg | Freq: Every day | INTRAVENOUS | Status: DC
  Administered 2019-09-11 (×2): 500 mg via INTRAVENOUS
  Filled 2019-09-10 (×2): qty 500

## 2019-09-10 MED ORDER — ACETAMINOPHEN 500 MG PO TABS: 1000.0000 mg | ORAL_TABLET | Freq: Once | ORAL | Status: DC

## 2019-09-10 MED ORDER — FINASTERIDE 5 MG PO TABS
5.0000 mg | ORAL_TABLET | Freq: Every day | ORAL | Status: DC
  Administered 2019-09-10 – 2019-09-18 (×8): 5 mg via ORAL
  Filled 2019-09-10 (×9): qty 1

## 2019-09-10 MED ORDER — GABAPENTIN 300 MG PO CAPS
300.0000 mg | ORAL_CAPSULE | Freq: Every day | ORAL | Status: DC | PRN
  Administered 2019-09-13 – 2019-09-22 (×4): 300 mg via ORAL
  Filled 2019-09-10 (×4): qty 1

## 2019-09-10 MED ORDER — APIXABAN (ELIQUIS) VTE STARTER PACK (10MG AND 5MG): 5.0000 mg | ORAL_TABLET | Freq: Two times a day (BID) | ORAL | Status: DC

## 2019-09-10 MED ORDER — VANCOMYCIN HCL 1500 MG/300ML IV SOLN
1500.0000 mg | Freq: Once | INTRAVENOUS | Status: AC
  Administered 2019-09-10: 1500 mg via INTRAVENOUS
  Filled 2019-09-10: qty 300

## 2019-09-10 MED ORDER — SIMVASTATIN 20 MG PO TABS
10.0000 mg | ORAL_TABLET | Freq: Every day | ORAL | Status: DC
  Administered 2019-09-10 – 2019-09-18 (×8): 10 mg via ORAL
  Filled 2019-09-10 (×9): qty 1

## 2019-09-10 MED ORDER — ENSURE ENLIVE PO LIQD
237.0000 mL | Freq: Two times a day (BID) | ORAL | Status: DC
  Administered 2019-09-11 – 2019-09-13 (×5): 237 mL via ORAL
  Filled 2019-09-10 (×2): qty 237

## 2019-09-10 MED ORDER — SODIUM CHLORIDE 0.9 % IV BOLUS
1000.0000 mL | Freq: Once | INTRAVENOUS | Status: AC
  Administered 2019-09-10: 1000 mL via INTRAVENOUS

## 2019-09-10 MED ORDER — SODIUM CHLORIDE (PF) 0.9 % IJ SOLN
INTRAMUSCULAR | Status: AC
  Filled 2019-09-10: qty 50

## 2019-09-10 MED ORDER — SODIUM CHLORIDE 0.9 % IV SOLN
2.0000 g | Freq: Once | INTRAVENOUS | Status: AC
  Administered 2019-09-10: 2 g via INTRAVENOUS
  Filled 2019-09-10: qty 2

## 2019-09-10 MED ORDER — ZOLPIDEM TARTRATE 5 MG PO TABS: 5.0000 mg | ORAL_TABLET | Freq: Every evening | ORAL | Status: DC | PRN

## 2019-09-10 NOTE — Progress Notes (Signed)
A consult was received from an ED physician for cefepime and vancomycin per pharmacy dosing.  The patient's profile has been reviewed for ht/wt/allergies/indication/available labs.   A one time order has been placed for Cefepime 2 Gm and Vancomycin 1500 mg.  Further antibiotics/pharmacy consults should be ordered by admitting physician if indicated.                       Thank you, Dorrene German 09/10/2019  9:33 PM

## 2019-09-10 NOTE — ED Notes (Signed)
Nasal cannula placed by Dr. Ronnald Nian due to sats sitting at 89% on RA. Pt placed on 3 lpm.

## 2019-09-10 NOTE — ED Provider Notes (Addendum)
[facility] DEPT Provider Note   CSN: [id number] Arrival date & time: 09/10/19  1939     History Chief Complaint  Patient presents with   Shortness of Breath   Fever    Barry Swanson is a 86 y.o. male.  The history is provided by the patient.  Fever Max temp prior to arrival:  103 Temp source:  Oral Severity:  Mild Onset quality:  Gradual Duration:  2 days Timing:  Constant Progression:  Unchanged Chronicity:  New Relieved by:  Nothing Worsened by:  Nothing Associated symptoms: no chest pain, no chills, no confusion, no congestion, no cough, no dysuria, no ear pain, no rash, no sore throat and no vomiting   Risk factors: recent sickness (admitted recently for UTI/DVT)        Past Medical History:  Diagnosis Date   Hepatitis B    HOH (hard of hearing)    Hyperlipidemia    Hypertension    Stroke [facility])    UTI (urinary tract infection)     Patient Active Problem List   Diagnosis Date Noted   Acute DVT (deep venous thrombosis) ([place]) 08/14/2019   Hematuria 08/14/2019   CKD (chronic kidney disease) stage 3, GFR 30-59 ml/min 08/14/2019   Dysphagia 08/14/2019   AMS (altered mental status) 08/08/2019   Bladder stone 06/01/2019   Sepsis ([place]) 04/30/2019   Acute pyelonephritis 04/30/2019   Acute kidney injury ([place]) 04/30/2019   Confusion    Essential hypertension    Acute encephalopathy 02/11/2015   TIA (transient ischemic attack) 02/11/2015   Hypertension    Hyperlipidemia    Calculus (=stone) 11/29/2012   Malignant neoplasm of prostate ([place]) 11/29/2012   Bladder outflow obstruction 11/29/2012    Past Surgical History:  Procedure Laterality Date   BALLOON DILATION N/A 06/29/2019   Procedure: BALLOON DILATION;  Surgeon: Wonda Horner, MD;  Location: [facility];  Service: Endoscopy;  Laterality: N/A;   BLADDER STONE REMOVAL     blasted   CATARACT EXTRACTION W/ INTRAOCULAR LENS  IMPLANT, BILATERAL   2004   CYSTOSCOPY WITH LITHOLAPAXY N/A 06/01/2019   Procedure: CYSTOSCOPY WITH LITHOLAPAXY;  Surgeon: Ardis Hughs, MD;  Location: [facility];  Service: Urology;  Laterality: N/A;   ESOPHAGOGASTRODUODENOSCOPY (EGD) WITH PROPOFOL N/A 06/29/2019   Procedure: ESOPHAGOGASTRODUODENOSCOPY (EGD) WITH PROPOFOL WITH POSSIBLE DIL;  Surgeon: Wonda Horner, MD;  Location: [facility];  Service: Endoscopy;  Laterality: N/A;   PROSTATE SURGERY     trimmed prostate away from urethral tract       Family History  Problem Relation Age of Onset   Heart disease Mother    Heart disease Father    Parkinson's disease Sister    Lung cancer Brother    Leukemia Brother     Social History   Tobacco Use   Smoking status: Former Smoker    Packs/day: 2.00    Types: Cigarettes    Quit date: 08/17/1998    Years since quitting: 21.0   Smokeless tobacco: Current User    Types: Chew  Substance Use Topics   Alcohol use: Not Currently    Comment: not in 15 years   Drug use: No    Home Medications Prior to Admission medications   Medication Sig Start Date End Date Taking? Authorizing Provider  Apixaban Starter Pack (ELIQUIS STARTER PACK) 5 MG TBPK Take as directed on package: start with two-5mg  tablets twice daily for 3 days. On 08/18/2019, switch to one-5mg  tablet twice daily. Patient  taking differently: Take 5 mg by mouth 2 (two) times daily.  08/15/19  Yes Lavina Hamman, MD  Cholecalciferol (VITAMIN D-3) 125 MCG (5000 UT) TABS Take 5,000 mg by mouth daily.   Yes [provider]  Coenzyme Q10 (COQ10 PO) Take 1 tablet by mouth at bedtime.    Yes [provider]  Cyanocobalamin (VITAMIN B-12) 5000 MCG TBDP Take 5,000 mcg by mouth daily.   Yes [provider]  feeding supplement, ENSURE ENLIVE, (ENSURE ENLIVE) LIQD Take 237 mLs by mouth 2 (two) times daily between meals. 08/15/19  Yes Lavina Hamman, MD  finasteride (PROSCAR) 5 MG tablet Take 5 mg by mouth at bedtime.   01/30/15  Yes [provider]  gabapentin (NEURONTIN) 300 MG capsule Take 300 mg by mouth daily as needed (pain).  01/02/19  Yes [provider]  Garlic 6237 MG TBEC Take 2,000 mg by mouth daily.    Yes [provider]  Multiple Vitamins-Minerals (CENTRUM SILVER) tablet Take 1 tablet by mouth daily. 09/13/12  Yes [provider]  simvastatin (ZOCOR) 10 MG tablet Take 10 mg by mouth at bedtime.    Yes [provider]  zolpidem (AMBIEN) 5 MG tablet Take 1 tablet (5 mg total) by mouth at bedtime as needed for sleep. 08/15/19  Yes Lavina Hamman, MD  Amino Acids-Protein Hydrolys (FEEDING SUPPLEMENT, PRO-STAT SUGAR FREE 64,) LIQD Take 30 mLs by mouth 2 (two) times daily. Patient not taking: Reported on 09/10/2019 08/15/19   Lavina Hamman, MD    Allergies    Patient has no known allergies.  Review of Systems   Review of Systems  Constitutional: Positive for fever. Negative for chills.  HENT: Negative for congestion, ear pain and sore throat.   Eyes: Negative for pain and visual disturbance.  Respiratory: Positive for shortness of breath. Negative for cough.   Cardiovascular: Negative for chest pain and palpitations.  Gastrointestinal: Negative for abdominal pain and vomiting.  Genitourinary: Negative for dysuria and hematuria.  Musculoskeletal: Negative for arthralgias and back pain.  Skin: Negative for color change and rash.  Neurological: Negative for seizures and syncope.  Psychiatric/Behavioral: Negative for confusion.  All other systems reviewed and are negative.   Physical Exam Updated Vital Signs  ED Triage Vitals  Enc Vitals Group     BP 09/10/19 2012 105/64     Pulse Rate 09/10/19 2012 96     Resp 09/10/19 2012 (!) 32     Temp 09/10/19 2012 (!) 101.4 F (38.6 C)     Temp Source 09/10/19 2012 Rectal     SpO2 09/10/19 1950 97 %     Weight 09/10/19 2033 165 lb (74.8 kg)     Height 09/10/19 2033 5\' 10"  (1.778 m)     Head  Circumference --      Peak Flow --      Pain Score 09/10/19 2033 0     Pain Loc --      Pain Edu? --      Excl. in [place]? --      Physical Exam Vitals and nursing note reviewed.  Constitutional:      General: He is not in acute distress.    Appearance: He is well-developed. He is not ill-appearing.  HENT:     Head: Normocephalic and atraumatic.     Mouth/Throat:     Mouth: Mucous membranes are moist.  Eyes:     Extraocular Movements: Extraocular movements intact.  Conjunctiva/sclera: Conjunctivae normal.     Pupils: Pupils are equal, round, and reactive to light.  Cardiovascular:     Rate and Rhythm: Normal rate and regular rhythm.     Pulses: Normal pulses.     Heart sounds: Normal heart sounds. No murmur.  Pulmonary:     Effort: Pulmonary effort is normal. No respiratory distress.     Breath sounds: Decreased breath sounds present.  Abdominal:     Palpations: Abdomen is soft.     Tenderness: There is no abdominal tenderness.  Musculoskeletal:     Cervical back: Neck supple.     Right lower leg: No edema.     Left lower leg: No edema.  Skin:    General: Skin is warm and dry.     Capillary Refill: Capillary refill takes less than 2 seconds.  Neurological:     General: No focal deficit present.     Mental Status: He is alert.  Psychiatric:        Mood and Affect: Mood normal.     ED Results / Procedures / Treatments   Labs (all labs ordered are listed, but only abnormal results are displayed) Labs Reviewed  LACTIC ACID, PLASMA - Abnormal; Notable for the following components:      Result Value   Lactic Acid, Venous 2.0 (*)    All other components within normal limits  CBC WITH DIFFERENTIAL/PLATELET - Abnormal; Notable for the following components:   Hemoglobin 12.7 (*)    RDW 15.6 (*)    Neutro Abs 8.3 (*)    All other components within normal limits  COMPREHENSIVE METABOLIC PANEL - Abnormal; Notable for the following components:   Glucose, Bld 157 (*)     Creatinine, Ser 1.75 (*)    Total Protein 5.6 (*)    Albumin 3.0 (*)    GFR calc non Af Amer 34 (*)    GFR calc Af Amer 39 (*)    All other components within normal limits  D-DIMER, QUANTITATIVE (NOT AT [facility]) - Abnormal; Notable for the following components:   D-Dimer, Quant 0.53 (*)    All other components within normal limits  FIBRINOGEN - Abnormal; Notable for the following components:   Fibrinogen 530 (*)    All other components within normal limits  CULTURE, BLOOD (ROUTINE X 2)  CULTURE, BLOOD (ROUTINE X 2)  URINE CULTURE  RESPIRATORY PANEL BY RT PCR (FLU A&B, COVID)  PROCALCITONIN  LACTATE DEHYDROGENASE  LACTIC ACID, PLASMA  FERRITIN  TRIGLYCERIDES  C-REACTIVE PROTEIN  URINALYSIS, ROUTINE W REFLEX MICROSCOPIC  POC SARS CORONAVIRUS 2 AG -  ED    EKG EKG Interpretation  Date/Time:  Sunday September 10 2019 20:10:20 EST Ventricular Rate:  98 PR Interval:    QRS Duration: 96 QT Interval:  344 QTC Calculation: 440 R Axis:   -49 Text Interpretation: Sinus rhythm Left anterior fascicular block Low voltage, precordial leads Consider anterior infarct Baseline wander in lead(s) V4 Confirmed by Lennice Sites [phone]) on 09/10/2019 10:04:25 PM   Radiology DG Chest Port 1 View  Result Date: 09/10/2019 CLINICAL DATA:  Shortness of breath, fever and hypoxia. EXAM: PORTABLE CHEST 1 VIEW COMPARISON:  August 08, 2019 FINDINGS: Mild infiltrate is seen within the right lung base. There is no evidence of a pleural effusion or pneumothorax. The heart size and mediastinal contours are within normal limits. The visualized skeletal structures are unremarkable. IMPRESSION: Mild right basilar infiltrate. Electronically Signed   By: Virgina Norfolk M.D.   On:  09/10/2019 20:58    Procedures .Critical Care Performed by: Lennice Sites, DO Authorized by: Lennice Sites, DO   Critical care provider statement:    Critical care time (minutes):  40   Critical care was necessary to treat or  prevent imminent or life-threatening deterioration of the following conditions:  Sepsis and respiratory failure   Critical care was time spent personally by me on the following activities:  Blood draw for specimens, development of treatment plan with patient or surrogate, discussions with primary provider, evaluation of patient's response to treatment, examination of patient, obtaining history from patient or surrogate, ordering and performing treatments and interventions, ordering and review of laboratory studies, ordering and review of radiographic studies, pulse oximetry, re-evaluation of patient's condition and review of old charts   I assumed direction of critical care for this patient from another provider in my specialty: no     (including critical care time)  Medications Ordered in ED Medications  ceFEPIme (MAXIPIME) 2 g in sodium chloride 0.9 % 100 mL IVPB (has no administration in time range)  vancomycin (VANCOREADY) IVPB 1500 mg/300 mL (has no administration in time range)  sodium chloride 0.9 % bolus 1,000 mL (has no administration in time range)    ED Course  I have reviewed the triage vital signs and the nursing notes.  Pertinent labs & imaging results that were available during my care of the patient were reviewed by me and considered in my medical decision making (see chart for details).    MDM Rules/Calculators/A&P                      Mareon Robinette is an 86 year old male with history of hypertension, high cholesterol, stroke who presents to the ED with fever, shortness of breath, hypoxia.  Patient hypoxic on room air to 85%.  Improved on 3 L of oxygen.  Recent admission for UTI and DVT.  Supposedly on blood thinners.  Fever for the last 2 days.  Some shortness of breath.  Otherwise denies any other symptoms.  Concern for coronavirus versus pneumonia versus PE.  Will initiate sepsis work-up.  Will hold antibiotics at this time as possibly coronavirus.  Normal blood pressure will  hold IV fluids.  Patient with mild right infiltrate.  Covid test is negative.  Lactic acid is 2.  At this time IV antibiotics to be initiated for sepsis work-up for possible pneumonia.  However still possibly could be coronavirus.  D-dimer is age-adjusted normal.  Patient recently did start anticoagulation.  However does have chronic kidney disease.  Will discuss with hospitalist about possibly obtaining a CT scan to rule out for PE.  He could get low-dose contrast load.  Otherwise no significant leukocytosis or anemia.  PCR Covid test has been sent.  Patient to be admitted for further care.  Fluid bolus given.  This chart was dictated using voice recognition software.  Despite best efforts to proofread,  errors can occur which can change the documentation meaning.   Barry Swanson was evaluated in Emergency Department on 09/10/2019 for the symptoms described in the history of present illness. He was evaluated in the context of the global COVID-19 pandemic, which necessitated consideration that the patient might be at risk for infection with the SARS-CoV-2 virus that causes COVID-19. Institutional protocols and algorithms that pertain to the evaluation of patients at risk for COVID-19 are in a state of rapid change based on information released by regulatory bodies including the CDC and federal and  state organizations. These policies and algorithms were followed during the patient's care in the ED.   Final Clinical Impression(s) / ED Diagnoses Final diagnoses:  Acute respiratory failure with hypoxia (HCC)  Sepsis, due to unspecified organism, unspecified whether acute organ dysfunction present ([place])  HAP (hospital-acquired pneumonia)    Rx / DC Orders ED Discharge Orders    None       Lennice Sites, DO 09/10/19 [id number]    Lennice Sites, DO 09/10/19 2204

## 2019-09-10 NOTE — H&P (Signed)
History and Physical    Barry Swanson MWU:[id number] DOB: [birth year] DOA: 09/10/2019  PCP: Rodrigo Ran, MD  Patient coming from: Home  I have personally briefly reviewed patient's old medical records in [facility]  Chief Complaint: SOB, fever  HPI: Barry Swanson is a 86 y.o. male with medical history significant of HTN, prior stroke, CKD stage 3.  Admitted in Dec for AMS due to UTI, complicated by DVT.  Discharged on eliquis which he has remained on.  Pt presents to ED with 2 day history of worsening fever and SOB.  Tm at home 103, no chills, no AMS nor confusion, no cough.  Symptoms constant and worsening.  No known COVID exposures.   ED Course: COVID POC is neg but PCR is pending still.  Tm 101.4 in ED, Satting upper 80s on RA, improved to mid 90s on 3L via [place].  CXR shows RLL infiltrate.  D.Dimer of 0.53  WBC 10k.  Given zosyn and vanc and 1L NS bolus  Creat 1.7 which is about his chronic baseline.  Procalcitonin 0.14  Review of Systems: As per HPI, otherwise all review of systems negative.  Past Medical History:  Diagnosis Date  . Hepatitis B   . HOH (hard of hearing)   . Hyperlipidemia   . Hypertension   . Stroke (HCC)   . UTI (urinary tract infection)     Past Surgical History:  Procedure Laterality Date  . BALLOON DILATION N/A 06/29/2019   Procedure: BALLOON DILATION;  Surgeon: Graylin Shiver, MD;  Location: [facility];  Service: Endoscopy;  Laterality: N/A;  . BLADDER STONE REMOVAL     blasted  . CATARACT EXTRACTION W/ INTRAOCULAR LENS  IMPLANT, BILATERAL  2004  . CYSTOSCOPY WITH LITHOLAPAXY N/A 06/01/2019   Procedure: CYSTOSCOPY WITH LITHOLAPAXY;  Surgeon: Crist Fat, MD;  Location: [facility];  Service: Urology;  Laterality: N/A;  . ESOPHAGOGASTRODUODENOSCOPY (EGD) WITH PROPOFOL N/A 06/29/2019   Procedure: ESOPHAGOGASTRODUODENOSCOPY (EGD) WITH PROPOFOL WITH POSSIBLE DIL;  Surgeon: Graylin Shiver, MD;  Location: [facility];  Service:  Endoscopy;  Laterality: N/A;  . PROSTATE SURGERY     trimmed prostate away from urethral tract     reports that he quit smoking about 21 years ago. His smoking use included cigarettes. He smoked 2.00 packs per day. His smokeless tobacco use includes chew. He reports previous alcohol use. He reports that he does not use drugs.  No Known Allergies  Family History  Problem Relation Age of Onset  . Heart disease Mother   . Heart disease Father   . Parkinson's disease Sister   . Lung cancer Brother   . Leukemia Brother      Prior to Admission medications   Medication Sig Start Date End Date Taking? Authorizing Provider  Apixaban Starter Pack (ELIQUIS STARTER PACK) 5 MG TBPK Take as directed on package: start with two-5mg  tablets twice daily for 3 days. On 08/18/2019, switch to one-5mg  tablet twice daily. Patient taking differently: Take 5 mg by mouth 2 (two) times daily.  08/15/19  Yes Rolly Salter, MD  Cholecalciferol (VITAMIN D-3) 125 MCG (5000 UT) TABS Take 5,000 mg by mouth daily.   Yes [provider]  Coenzyme Q10 (COQ10 PO) Take 1 tablet by mouth at bedtime.    Yes [provider]  Cyanocobalamin (VITAMIN B-12) 5000 MCG TBDP Take 5,000 mcg by mouth daily.   Yes [provider]  feeding supplement, ENSURE ENLIVE, (ENSURE ENLIVE) LIQD Take 237 mLs by  mouth 2 (two) times daily between meals. 08/15/19  Yes Rolly Salter, MD  finasteride (PROSCAR) 5 MG tablet Take 5 mg by mouth at bedtime.  01/30/15  Yes [provider]  gabapentin (NEURONTIN) 300 MG capsule Take 300 mg by mouth daily as needed (pain).  01/02/19  Yes [provider]  Garlic 2000 MG TBEC Take 2,000 mg by mouth daily.    Yes [provider]  Multiple Vitamins-Minerals (CENTRUM SILVER) tablet Take 1 tablet by mouth daily. 09/13/12  Yes [provider]  simvastatin (ZOCOR) 10 MG tablet Take 10 mg by mouth at bedtime.    Yes [provider]  zolpidem  (AMBIEN) 5 MG tablet Take 1 tablet (5 mg total) by mouth at bedtime as needed for sleep. 08/15/19  Yes Rolly Salter, MD  Amino Acids-Protein Hydrolys (FEEDING SUPPLEMENT, PRO-STAT SUGAR FREE 64,) LIQD Take 30 mLs by mouth 2 (two) times daily. Patient not taking: Reported on 09/10/2019 08/15/19   Rolly Salter, MD    Physical Exam: Vitals:   09/10/19 1950 09/10/19 2012 09/10/19 2033 09/10/19 2130  BP:  105/64  104/67  Pulse:  96  78  Resp:  (!) 32  (!) 22  Temp:  (!) 101.4 F (38.6 C)    TempSrc:  Rectal    SpO2: 97% (!) 89%  95%  Weight:   74.8 kg   Height:   5\' 10"  (1.778 m)     Constitutional: NAD, calm, ill appearing and fatigued Eyes: PERRL, lids and conjunctivae normal ENMT: Mucous membranes are moist. Posterior pharynx clear of any exudate or lesions.Normal dentition.  Neck: normal, supple, no masses, no thyromegaly Respiratory: diminished BS. Cardiovascular: Regular rate and rhythm, no murmurs / rubs / gallops. No extremity edema. 2+ pedal pulses. No carotid bruits.  Abdomen: no tenderness, no masses palpated. No hepatosplenomegaly. Bowel sounds positive.  Musculoskeletal: no clubbing / cyanosis. No joint deformity upper and lower extremities. Good ROM, no contractures. Normal muscle tone.  Skin: no rashes, lesions, ulcers. No induration Neurologic: CN 2-12 grossly intact. Sensation intact, DTR normal. Strength 5/5 in all 4.  Psychiatric: Normal judgment and insight. Alert and oriented x 3. Normal mood.    Labs on Admission: I have personally reviewed following labs and imaging studies  CBC: Recent Labs  Lab 09/10/19 2020  WBC 10.0  NEUTROABS 8.3*  HGB 12.7*  HCT 40.8  MCV 90.7  PLT 279   Basic Metabolic Panel: Recent Labs  Lab 09/10/19 2020  NA 140  K 3.9  CL 106  CO2 23  GLUCOSE 157*  BUN 18  CREATININE 1.75*  CALCIUM 9.2   GFR: Estimated Creatinine Clearance: 30.1 mL/min (A) (by C-G formula based on SCr of 1.75 mg/dL (H)). Liver Function  Tests: Recent Labs  Lab 09/10/19 2020  AST 20  ALT 15  ALKPHOS 57  BILITOT 0.6  PROT 5.6*  ALBUMIN 3.0*   No results for input(s): LIPASE, AMYLASE in the last 168 hours. No results for input(s): AMMONIA in the last 168 hours. Coagulation Profile: No results for input(s): INR, PROTIME in the last 168 hours. Cardiac Enzymes: No results for input(s): CKTOTAL, CKMB, CKMBINDEX, TROPONINI in the last 168 hours. BNP (last 3 results) No results for input(s): PROBNP in the last 8760 hours. HbA1C: No results for input(s): HGBA1C in the last 72 hours. CBG: No results for input(s): GLUCAP in the last 168 hours. Lipid Profile: Recent Labs    09/10/19 2020  TRIG 111  Thyroid Function Tests: No results for input(s): TSH, T4TOTAL, FREET4, T3FREE, THYROIDAB in the last 72 hours. Anemia Panel: No results for input(s): VITAMINB12, FOLATE, FERRITIN, TIBC, IRON, RETICCTPCT in the last 72 hours. Urine analysis:    Component Value Date/Time   COLORURINE YELLOW 08/08/2019 1739   APPEARANCEUR TURBID (A) 08/08/2019 1739   LABSPEC 1.009 08/08/2019 1739   PHURINE 5.0 08/08/2019 1739   GLUCOSEU NEGATIVE 08/08/2019 1739   HGBUR LARGE (A) 08/08/2019 1739   BILIRUBINUR NEGATIVE 08/08/2019 1739   KETONESUR NEGATIVE 08/08/2019 1739   PROTEINUR 100 (A) 08/08/2019 1739   UROBILINOGEN 1.0 02/28/2015 1550   NITRITE NEGATIVE 08/08/2019 1739   LEUKOCYTESUR LARGE (A) 08/08/2019 1739    Radiological Exams on Admission: DG Chest Port 1 View  Result Date: 09/10/2019 CLINICAL DATA:  Shortness of breath, fever and hypoxia. EXAM: PORTABLE CHEST 1 VIEW COMPARISON:  August 08, 2019 FINDINGS: Mild infiltrate is seen within the right lung base. There is no evidence of a pleural effusion or pneumothorax. The heart size and mediastinal contours are within normal limits. The visualized skeletal structures are unremarkable. IMPRESSION: Mild right basilar infiltrate. Electronically Signed   By: Aram Candela  M.D.   On: 09/10/2019 20:58    EKG: Independently reviewed.  Assessment/Plan Principal Problem:   Right lower lobe pneumonia Active Problems:   Essential hypertension   CKD (chronic kidney disease) stage 3, GFR 30-59 ml/min   Acute respiratory failure with hypoxia (HCC)   Fever   History of DVT (deep vein thrombosis)    1. Acute resp failure with hypoxia - 1. DDx COVID vs CAP vs PE 2. Possible COVID - 1. Seems likely given nl procalcitonin, WBC, current pandemic 2. COVID PCR pending 3. If positive will need to start on remdesivir and steroids 4. CRP pending 3. Possible bacterial CAP - 1. Though nl WBC and nl procalcitonin 2. PNA pathway 3. Empiric rocephin and azithro (got cefepime and vanc in ED) 4. MRSA PCR nares 5. Add ongoing Vanc if COVID neg and if PCR MRSA positive 6. BCx pending 4. Possible PE - 1. Less likely 1 month on eliquis, also D.Dimer neg 2. Just in case though, will put on heparin gtt instead of eliquis 3. Get VQ scan in AM if COVID ends up coming back negative. 4. Tele monitor 5. Cont pulse ox 6. Tylenol PRN fever 7. Repeat CBC, CMP in AM 2. CKD stage 3 - chronic and stable 3. HTN - 1. Chronic and stable, not on home meds.  DVT prophylaxis: Heparin GTT Code Status: Full Family Communication: No family in room Disposition Plan: Home after admit Consults called: None Admission status: Admit to inpatient  Severity of Illness: The appropriate patient status for this patient is INPATIENT. Inpatient status is judged to be reasonable and necessary in order to provide the required intensity of service to ensure the patient's safety. The patient's presenting symptoms, physical exam findings, and initial radiographic and laboratory data in the context of their chronic comorbidities is felt to place them at high risk for further clinical deterioration. Furthermore, it is not anticipated that the patient will be medically stable for discharge from the hospital  within 2 midnights of admission. The following factors support the patient status of inpatient.   IP status due to likely PNA with new O2 requirement.  Not sure if COVID or bacterial CAP yet though as discussed above.   * I certify that at the point of admission it is my clinical judgment that the  patient will require inpatient hospital care spanning beyond 2 midnights from the point of admission due to high intensity of service, high risk for further deterioration and high frequency of surveillance required.*    Barry Swanson M. DO [facility]  How to contact the [facility] Attending or Consulting provider 7A - 7P or covering provider during after hours 7P -7A, for this patient?  1. Check the care team in [facility] and look for a) attending/consulting [facility] provider listed and b) the [facility] team listed 2. Log into [email or web address]  Amion Physician Scheduling and messaging for groups and whole hospitals  On call and physician scheduling software for group practices, residents, hospitalists and other medical providers for call, clinic, rotation and shift schedules. On[employer] is a hospital-wide system for scheduling doctors and paging doctors on call. EasyPlot is for scientific plotting and data analysis.  [email or web address]  and use [place]'s universal password to access. If you do not have the password, please contact the hospital operator.  3. Locate the [facility] provider you are looking for under [facility] and page to a number that you can be directly reached. 4. If you still have difficulty reaching the provider, please page the [facility] (Director on Call) for the Hospitalists listed on amion for assistance.  09/10/2019, 11:18 PM

## 2019-09-10 NOTE — ED Triage Notes (Signed)
Per EMS - Pt coming from home with CC of fever and SOB x 2 days. No known exposure. 88%on RA upon arrival on scene. On NRB @ 10L pt at 97%. Denies any cough, sore throat, or weakness. Lung sounds were clear.   1000 mg Tylenol  22g Rt Hand  104.8 140 92 110 HR 20 R 97% on 10L NRB

## 2019-09-11 ENCOUNTER — Other Ambulatory Visit: Payer: Self-pay

## 2019-09-11 ENCOUNTER — Inpatient Hospital Stay (HOSPITAL_COMMUNITY): Payer: Medicare Other

## 2019-09-11 ENCOUNTER — Encounter (HOSPITAL_COMMUNITY): Payer: Self-pay | Admitting: Internal Medicine

## 2019-09-11 DIAGNOSIS — I10 Essential (primary) hypertension: Secondary | ICD-10-CM

## 2019-09-11 LAB — COMPREHENSIVE METABOLIC PANEL
ALT: 16 U/L (ref 0–44)
AST: 17 U/L (ref 15–41)
Albumin: 2.8 g/dL — ABNORMAL LOW (ref 3.5–5.0)
Alkaline Phosphatase: 53 U/L (ref 38–126)
Anion gap: 7 (ref 5–15)
BUN: 19 mg/dL (ref 8–23)
CO2: 26 mmol/L (ref 22–32)
Calcium: 8.7 mg/dL — ABNORMAL LOW (ref 8.9–10.3)
Chloride: 108 mmol/L (ref 98–111)
Creatinine, Ser: 1.77 mg/dL — ABNORMAL HIGH (ref 0.61–1.24)
GFR calc Af Amer: 39 mL/min — ABNORMAL LOW (ref >60)
GFR calc non Af Amer: 34 mL/min — ABNORMAL LOW (ref >60)
Glucose, Bld: 142 mg/dL — ABNORMAL HIGH (ref 70–99)
Potassium: 4.2 mmol/L (ref 3.5–5.1)
Sodium: 141 mmol/L (ref 135–145)
Total Bilirubin: 0.7 mg/dL (ref 0.3–1.2)
Total Protein: 5.6 g/dL — ABNORMAL LOW (ref 6.5–8.1)

## 2019-09-11 LAB — HIV ANTIBODY (ROUTINE TESTING W REFLEX): HIV Screen 4th Generation wRfx: NONREACTIVE

## 2019-09-11 LAB — CBC
HCT: 41.7 % (ref 39.0–52.0)
Hemoglobin: 12.4 g/dL — ABNORMAL LOW (ref 13.0–17.0)
MCH: 28.1 pg (ref 26.0–34.0)
MCHC: 29.7 g/dL — ABNORMAL LOW (ref 30.0–36.0)
MCV: 94.6 fL (ref 80.0–100.0)
Platelets: 286 10*3/uL (ref 150–400)
RBC: 4.41 MIL/uL (ref 4.22–5.81)
RDW: 15.6 % — ABNORMAL HIGH (ref 11.5–15.5)
WBC: 24.7 10*3/uL — ABNORMAL HIGH (ref 4.0–10.5)
nRBC: 0 % (ref 0.0–0.2)

## 2019-09-11 LAB — URINALYSIS, ROUTINE W REFLEX MICROSCOPIC
Bilirubin Urine: NEGATIVE
Glucose, UA: NEGATIVE mg/dL
Ketones, ur: NEGATIVE mg/dL
Nitrite: NEGATIVE
Protein, ur: 100 mg/dL — AB
RBC / HPF: >50 RBC/hpf — ABNORMAL HIGH (ref 0–5)
Specific Gravity, Urine: 1.015 (ref 1.005–1.030)
WBC, UA: >50 WBC/hpf — ABNORMAL HIGH (ref 0–5)
pH: 5 (ref 5.0–8.0)

## 2019-09-11 LAB — C-REACTIVE PROTEIN
CRP: 2.7 mg/dL — ABNORMAL HIGH (ref <1.0)
CRP: 4.1 mg/dL — ABNORMAL HIGH (ref <1.0)
CRP: 8.5 mg/dL — ABNORMAL HIGH (ref <1.0)

## 2019-09-11 LAB — RESPIRATORY PANEL BY RT PCR (FLU A&B, COVID)
Influenza A by PCR: NEGATIVE
Influenza B by PCR: NEGATIVE
SARS Coronavirus 2 by RT PCR: NEGATIVE

## 2019-09-11 LAB — APTT
aPTT: 34 seconds (ref 24–36)
aPTT: 62 seconds — ABNORMAL HIGH (ref 24–36)

## 2019-09-11 LAB — PROCALCITONIN: Procalcitonin: 2.77 ng/mL

## 2019-09-11 LAB — LACTIC ACID, PLASMA: Lactic Acid, Venous: 1 mmol/L (ref 0.5–1.9)

## 2019-09-11 LAB — BRAIN NATRIURETIC PEPTIDE: B Natriuretic Peptide: 270.1 pg/mL — ABNORMAL HIGH (ref 0.0–100.0)

## 2019-09-11 LAB — PROTIME-INR
INR: 1.4 — ABNORMAL HIGH (ref 0.8–1.2)
Prothrombin Time: 16.8 seconds — ABNORMAL HIGH (ref 11.4–15.2)

## 2019-09-11 LAB — MRSA PCR SCREENING: MRSA by PCR: NEGATIVE

## 2019-09-11 LAB — HEPARIN LEVEL (UNFRACTIONATED): Heparin Unfractionated: 1.9 IU/mL — ABNORMAL HIGH (ref 0.30–0.70)

## 2019-09-11 LAB — FERRITIN: Ferritin: 36 ng/mL (ref 24–336)

## 2019-09-11 MED ORDER — SODIUM CHLORIDE 0.9 % IV SOLN
2.0000 g | Freq: Two times a day (BID) | INTRAVENOUS | Status: AC
  Administered 2019-09-11 – 2019-09-20 (×20): 2 g via INTRAVENOUS
  Filled 2019-09-11 (×20): qty 2

## 2019-09-11 MED ORDER — TECHNETIUM TO 99M ALBUMIN AGGREGATED
1.6000 | Freq: Once | INTRAVENOUS | Status: AC | PRN
  Administered 2019-09-11: 1.6 via INTRAVENOUS

## 2019-09-11 MED ORDER — SODIUM CHLORIDE 0.9 % IV SOLN: INTRAVENOUS | Status: AC

## 2019-09-11 MED ORDER — APIXABAN 5 MG PO TABS
5.0000 mg | ORAL_TABLET | Freq: Two times a day (BID) | ORAL | Status: DC
  Administered 2019-09-11 – 2019-09-16 (×9): 5 mg via ORAL
  Filled 2019-09-11 (×10): qty 1

## 2019-09-11 MED ORDER — HEPARIN (PORCINE) 25000 UT/250ML-% IV SOLN
1300.0000 [IU]/h | INTRAVENOUS | Status: AC
  Administered 2019-09-11: 1200 [IU]/h via INTRAVENOUS
  Filled 2019-09-11 (×2): qty 250

## 2019-09-11 MED ORDER — HEPARIN BOLUS VIA INFUSION
3000.0000 [IU] | Freq: Once | INTRAVENOUS | Status: AC
  Administered 2019-09-11: 3000 [IU] via INTRAVENOUS
  Filled 2019-09-11: qty 3000

## 2019-09-11 MED ORDER — ADULT MULTIVITAMIN W/MINERALS CH
1.0000 | ORAL_TABLET | Freq: Every day | ORAL | Status: DC
  Administered 2019-09-12 – 2019-09-23 (×8): 1 via ORAL
  Filled 2019-09-11 (×8): qty 1

## 2019-09-11 NOTE — Evaluation (Signed)
Physical Therapy Evaluation Patient Details Name: Barry Swanson MRN: [id number] DOB: March 18, 1929 Today's Date: 09/11/2019   History of Present Illness  Barry Swanson is a 86 y.o. male with medical history significant of HTN, prior stroke, CKD stage 3.  Pt with recent admission in December for DVT.  Pt admitted with PNE.  Perfusion scan negative for PE.  Clinical Impression  Pt admitted with above diagnosis. Pt able to transfer and ambulate 4' with RW and min A.  Required cues for safety and RW use.  Pt fatigued easily but O2 sats were stable on 3 LPM O2.  Pt currently with functional limitations due to the deficits listed below (see PT Problem List). Pt will benefit from skilled PT to increase their independence and safety with mobility to allow discharge to the venue listed below.       Follow Up Recommendations Home health PT;Supervision/Assistance - 24 hour(Has 24 hr family care)    Equipment Recommendations  None recommended by PT    Recommendations for Other Services       Precautions / Restrictions Precautions Precautions: Fall      Mobility  Bed Mobility Overal bed mobility: Needs Assistance Bed Mobility: Supine to Sit     Supine to sit: Min assist;HOB elevated     General bed mobility comments: use of bed rails and increased time  Transfers Overall transfer level: Needs assistance Equipment used: Rolling walker (2 wheeled) Transfers: Sit to/from Stand Sit to Stand: Min assist         General transfer comment: cues for safe hand placement;  pt had BM in standing required assistance for cleaning  Ambulation/Gait Ambulation/Gait assistance: Min guard Gait Distance (Feet): 4 Feet Assistive device: Rolling walker (2 wheeled) Gait Pattern/deviations: Decreased stride length     General Gait Details: cued for RW use; fatigued easily  Stairs            Wheelchair Mobility    Modified Rankin (Stroke Patients Only)       Balance Overall balance  assessment: Needs assistance Sitting-balance support: Bilateral upper extremity supported;Feet supported Sitting balance-Leahy Scale: Good     Standing balance support: Bilateral upper extremity supported;During functional activity Standing balance-Leahy Scale: Fair Standing balance comment: with RW                             Pertinent Vitals/Pain Pain Assessment: No/denies pain    Home Living Family/patient expects to be discharged to:: Private residence Living Arrangements: Children Available Help at Discharge: Family;Available 24 hours/day Type of Home: House Home Access: Stairs to enter Entrance Stairs-Rails: None(reports log house and logs stick out that he can hold to) Entrance Stairs-Number of Steps: 2 Home Layout: One level Home Equipment: Walker - 4 wheels;Shower seat - built in      Prior Function Level of Independence: Needs assistance   Gait / Transfers Assistance Needed: uses rollator; household distances  ADL's / Hydrologist Assistance Needed: Family assist with ADLs        Hand Dominance        Extremity/Trunk Assessment   Upper Extremity Assessment Upper Extremity Assessment: Overall WFL for tasks assessed    Lower Extremity Assessment Lower Extremity Assessment: Overall WFL for tasks assessed    Cervical / Trunk Assessment Cervical / Trunk Assessment: Kyphotic  Communication   Communication: HOH  Cognition Arousal/Alertness: Awake/alert Behavior During Therapy: WFL for tasks assessed/performed Overall Cognitive Status: Within Functional Limits for tasks assessed  General Comments: increased time required to respond      General Comments General comments (skin integrity, edema, etc.): On 3 LPM O2 with sats 92-93%    Exercises     Assessment/Plan    PT Assessment Patient needs continued PT services  PT Problem List Decreased strength;Decreased mobility;Decreased safety  awareness;Decreased range of motion;Decreased coordination;Decreased activity tolerance;Cardiopulmonary status limiting activity;Decreased balance;Decreased knowledge of use of DME       PT Treatment Interventions DME instruction;Therapeutic activities;Gait training;Therapeutic exercise;Patient/family education;Stair training;Balance training;Functional mobility training    PT Goals (Current goals can be found in the Care Plan section)  Acute Rehab PT Goals Patient Stated Goal: return home PT Goal Formulation: With patient Time For Goal Achievement: 09/25/19 Potential to Achieve Goals: Good    Frequency Min 3X/week   Barriers to discharge        Co-evaluation               AM-PAC PT "6 Clicks" Mobility  Outcome Measure Help needed turning from your back to your side while in a flat bed without using bedrails?: None Help needed moving from lying on your back to sitting on the side of a flat bed without using bedrails?: A Little Help needed moving to and from a bed to a chair (including a wheelchair)?: A Little Help needed standing up from a chair using your arms (e.g., wheelchair or bedside chair)?: A Little Help needed to walk in hospital room?: A Little Help needed climbing 3-5 steps with a railing? : A Lot 6 Click Score: 18    End of Session Equipment Utilized During Treatment: Gait belt;Oxygen Activity Tolerance: Patient tolerated treatment well Patient left: with chair alarm set;in chair;with call bell/phone within reach Nurse Communication: Mobility status PT Visit Diagnosis: Unsteadiness on feet (R26.81);Other abnormalities of gait and mobility (R26.89);Muscle weakness (generalized) (M62.81)    Time: 0932-3557 PT Time Calculation (min) (ACUTE ONLY): 30 min   Charges:   PT Evaluation $PT Eval Low Complexity: 1 Low          Maggie Font, PT Acute Rehab Services Pager [phone] [facility] [phone] [facility] [phone]   Karlton Lemon 09/11/2019, 5:58 PM

## 2019-09-11 NOTE — ED Notes (Signed)
ED TO INPATIENT HANDOFF REPORT  Name/Age/Gender Barry Swanson 86 y.o. male  Code Status    Code Status Orders  (From admission, onward)         Start     Ordered   09/10/19 2312  Full code  Continuous     09/10/19 2316        Code Status History    Date Active Date Inactive Code Status Order ID Comments User Context   08/08/2019 1947 08/15/2019 1844 Full Code [id number]  Jani Gravel, MD ED   05/01/2019 0109 05/04/2019 1617 Full Code [id number]  Elwyn Reach, MD ED   02/11/2015 1850 02/13/2015 1517 Full Code [id number]  Mendel Corning, MD ED   Advance Care Planning Activity      Home/SNF/Other Home  Chief Complaint Acute respiratory failure with hypoxia (HCC) [J96.01]  Level of Care/Admitting Diagnosis ED Disposition    ED Disposition Condition [facility] Area: [facility] [[id number]]  Level of Care: Telemetry [5]  Admit to tele based on following criteria: Other see comments  Comments: May have PE  Covid Evaluation: Confirmed COVID Negative  Diagnosis: Acute respiratory failure with hypoxia [facility]) [[id number]]  Admitting Physician: Doreatha Massed  Attending Physician: Etta Quill [phone]  Estimated length of stay: past midnight tomorrow  Certification:: I certify this patient will need inpatient services for at least 2 midnights       Medical History Past Medical History:  Diagnosis Date  . Hepatitis B   . HOH (hard of hearing)   . Hyperlipidemia   . Hypertension   . Stroke ([place])   . UTI (urinary tract infection)     Allergies No Known Allergies  IV Location/Drains/Wounds Patient Lines/Drains/Airways Status   Active Line/Drains/Airways    Name:   Placement date:   Placement time:   Site:   Days:   Peripheral IV 09/10/19 Posterior;Right Hand   09/10/19    1951    Hand   1   Peripheral IV 09/11/19 Left;Lateral Forearm   09/11/19    0234    Forearm   less than 1          Labs/Imaging Results for orders  placed or performed during the hospital encounter of 09/10/19 (from the past 48 hour(s))  Lactic acid, plasma     Status: Abnormal   Collection Time: 09/10/19  8:20 PM  Result Value Ref Range   Lactic Acid, Venous 2.0 (HH) 0.5 - 1.9 mmol/L    Comment: CRITICAL RESULT CALLED TO, READ BACK BY AND VERIFIED WITH: CHARGE, RN ON 09/10/19 @ 2134 BY LE Performed at [facility], [place] [address] [place] [place], [place] [address]   CBC WITH DIFFERENTIAL     Status: Abnormal   Collection Time: 09/10/19  8:20 PM  Result Value Ref Range   WBC 10.0 4.0 - 10.5 K/uL   RBC 4.50 4.22 - 5.81 MIL/uL   Hemoglobin 12.7 (L) 13.0 - 17.0 g/dL   HCT 40.8 39.0 - 52.0 %   MCV 90.7 80.0 - 100.0 fL   MCH 28.2 26.0 - 34.0 pg   MCHC 31.1 30.0 - 36.0 g/dL   RDW 15.6 (H) 11.5 - 15.5 %   Platelets 279 150 - 400 K/uL   nRBC 0.0 0.0 - 0.2 %   Neutrophils Relative % 84 %   Neutro Abs 8.3 (H) 1.7 - 7.7 K/uL   Lymphocytes Relative 7 %   Lymphs Abs 0.7  0.7 - 4.0 K/uL   Monocytes Relative 5 %   Monocytes Absolute 0.5 0.1 - 1.0 K/uL   Eosinophils Relative 4 %   Eosinophils Absolute 0.4 0.0 - 0.5 K/uL   Basophils Relative 0 %   Basophils Absolute 0.0 0.0 - 0.1 K/uL   Immature Granulocytes 0 %   Abs Immature Granulocytes 0.03 0.00 - 0.07 K/uL    Comment: Performed at [facility], [place] [address], [place], [place] [address]  Comprehensive metabolic panel     Status: Abnormal   Collection Time: 09/10/19  8:20 PM  Result Value Ref Range   Sodium 140 135 - 145 mmol/L   Potassium 3.9 3.5 - 5.1 mmol/L   Chloride 106 98 - 111 mmol/L   CO2 23 22 - 32 mmol/L   Glucose, Bld 157 (H) 70 - 99 mg/dL   BUN 18 8 - 23 mg/dL   Creatinine, Ser 1.75 (H) 0.61 - 1.24 mg/dL   Calcium 9.2 8.9 - 10.3 mg/dL   Total Protein 5.6 (L) 6.5 - 8.1 g/dL   Albumin 3.0 (L) 3.5 - 5.0 g/dL   AST 20 15 - 41 U/L   ALT 15 0 - 44 U/L   Alkaline Phosphatase 57 38 - 126 U/L   Total Bilirubin 0.6 0.3 - 1.2 mg/dL   GFR calc  non Af Amer 34 (L) >60 mL/min   GFR calc Af Amer 39 (L) >60 mL/min   Anion gap 11 5 - 15    Comment: Performed at [facility], [place] [address], [place], [place] [address]  D-dimer, quantitative     Status: Abnormal   Collection Time: 09/10/19  8:20 PM  Result Value Ref Range   D-Dimer, Quant 0.53 (H) 0.00 - 0.50 ug/mL-FEU    Comment: (NOTE) At the manufacturer cut-off of 0.50 ug/mL FEU, this assay has been documented to exclude PE with a sensitivity and negative predictive value of 97 to 99%.  At this time, this assay has not been approved by the FDA to exclude DVT/VTE. Results should be correlated with clinical presentation. Performed at [facility], [place] [address], [place], [place] [address]   Procalcitonin     Status: None   Collection Time: 09/10/19  8:20 PM  Result Value Ref Range   Procalcitonin 0.14 ng/mL    Comment:        Interpretation: PCT (Procalcitonin) <= 0.5 ng/mL: Systemic infection (sepsis) is not likely. Local bacterial infection is possible. (NOTE)       Sepsis PCT Algorithm           Lower Respiratory Tract                                      Infection PCT Algorithm    ----------------------------     ----------------------------         PCT < 0.25 ng/mL                PCT < 0.10 ng/mL         Strongly encourage             Strongly discourage   discontinuation of antibiotics    initiation of antibiotics    ----------------------------     -----------------------------       PCT 0.25 - 0.50 ng/mL            PCT 0.10 -  0.25 ng/mL               OR       >80% decrease in PCT            Discourage initiation of                                            antibiotics      Encourage discontinuation           of antibiotics    ----------------------------     -----------------------------         PCT >= 0.50 ng/mL              PCT 0.26 - 0.50 ng/mL               AND        <80% decrease in PCT             Encourage  initiation of                                             antibiotics       Encourage continuation           of antibiotics    ----------------------------     -----------------------------        PCT >= 0.50 ng/mL                  PCT > 0.50 ng/mL               AND         increase in PCT                  Strongly encourage                                      initiation of antibiotics    Strongly encourage escalation           of antibiotics                                     -----------------------------                                           PCT <= 0.25 ng/mL                                                 OR                                        > 80% decrease in PCT  Discontinue / Do not initiate                                             antibiotics Performed at [place] [address], [place], [place] [address]   Lactate dehydrogenase     Status: None   Collection Time: 09/10/19  8:20 PM  Result Value Ref Range   LDH 137 98 - 192 U/L    Comment: Performed at [facility], [place] [address], [place], [place] [address]  Ferritin     Status: None   Collection Time: 09/10/19  8:20 PM  Result Value Ref Range   Ferritin 36 24 - 336 ng/mL    Comment: Performed at [facility], [place] [address], [place], [place] [address]  Triglycerides     Status: None   Collection Time: 09/10/19  8:20 PM  Result Value Ref Range   Triglycerides 111 <150 mg/dL    Comment: Performed at [facility], [place] [address] [place], [place] [address]  Fibrinogen     Status: Abnormal   Collection Time: 09/10/19  8:20 PM  Result Value Ref Range   Fibrinogen 530 (H) 210 - 475 mg/dL    Comment: Performed at [facility], [place] [address], [place], [place] [address]  C-reactive protein     Status: Abnormal   Collection Time: 09/10/19  8:20 PM  Result Value Ref Range   CRP  2.7 (H) <1.0 mg/dL    Comment: Performed at [facility], [place] [address], [place], [place] [address]  POC SARS Coronavirus 2 Ag-ED - Nasal Swab (BD Veritor Kit)     Status: None   Collection Time: 09/10/19  9:17 PM  Result Value Ref Range   SARS Coronavirus 2 Ag NEGATIVE NEGATIVE    Comment: (NOTE) SARS-CoV-2 antigen NOT DETECTED.  Negative results are presumptive.  Negative results do not preclude SARS-CoV-2 infection and should not be used as the sole basis for treatment or other patient management decisions, including infection  control decisions, particularly in the presence of clinical signs and  symptoms consistent with COVID-19, or in those who have been in contact with the virus.  Negative results must be combined with clinical observations, patient history, and epidemiological information. The expected result is Negative. Fact Sheet for Patients: PodPark.tn Fact Sheet for Healthcare Providers: GiftContent.is This test is not yet approved or cleared by the [place] FDA and  has been authorized for detection and/or diagnosis of SARS-CoV-2 by FDA under an Emergency Use Authorization (EUA).  This EUA will remain in effect (meaning this test can be used) for the duration of  the COVID-19 de claration under Section 564(b)(1) of the Act, 21 U.S.C. section 360bbb-3(b)(1), unless the authorization is terminated or revoked sooner.   Respiratory Panel by RT PCR (Flu A&B, Covid) - Nasopharyngeal Swab     Status: None   Collection Time: 09/10/19 10:18 PM   Specimen: Nasopharyngeal Swab  Result Value Ref Range   SARS Coronavirus 2 by RT PCR NEGATIVE NEGATIVE    Comment: (NOTE) SARS-CoV-2 target nucleic acids are NOT DETECTED. The SARS-CoV-2 RNA is generally detectable in upper respiratoy specimens during the acute phase of infection. The lowest concentration of SARS-CoV-2 viral copies this assay can  detect is 131 copies/mL. A negative result does not preclude SARS-Cov-2 infection  and should not be used as the sole basis for treatment or other patient management decisions. A negative result may occur with  improper specimen collection/handling, submission of specimen other than nasopharyngeal swab, presence of viral mutation(s) within the areas targeted by this assay, and inadequate number of viral copies (<131 copies/mL). A negative result must be combined with clinical observations, patient history, and epidemiological information. The expected result is Negative. Fact Sheet for Patients:  PinkCheek.be Fact Sheet for Healthcare Providers:  GravelBags.it This test is not yet ap proved or cleared by the [place] FDA and  has been authorized for detection and/or diagnosis of SARS-CoV-2 by FDA under an Emergency Use Authorization (EUA). This EUA will remain  in effect (meaning this test can be used) for the duration of the COVID-19 declaration under Section 564(b)(1) of the Act, 21 U.S.C. section 360bbb-3(b)(1), unless the authorization is terminated or revoked sooner.    Influenza A by PCR NEGATIVE NEGATIVE   Influenza B by PCR NEGATIVE NEGATIVE    Comment: (NOTE) The Xpert Xpress SARS-CoV-2/FLU/RSV assay is intended as an aid in  the diagnosis of influenza from Nasopharyngeal swab specimens and  should not be used as a sole basis for treatment. Nasal washings and  aspirates are unacceptable for Xpert Xpress SARS-CoV-2/FLU/RSV  testing. Fact Sheet for Patients: PinkCheek.be Fact Sheet for Healthcare Providers: GravelBags.it This test is not yet approved or cleared by the [place] FDA and  has been authorized for detection and/or diagnosis of SARS-CoV-2 by  FDA under an Emergency Use Authorization (EUA). This EUA will remain  in effect (meaning this test  can be used) for the duration of the  Covid-19 declaration under Section 564(b)(1) of the Act, 21  U.S.C. section 360bbb-3(b)(1), unless the authorization is  terminated or revoked. Performed at [facility], [place] [address] [place], [place] [address]   Lactic acid, plasma     Status: None   Collection Time: 09/11/19 12:13 AM  Result Value Ref Range   Lactic Acid, Venous 1.0 0.5 - 1.9 mmol/L    Comment: Performed at [facility], [place] [address], [place], [place] [address]  APTT     Status: None   Collection Time: 09/11/19 12:13 AM  Result Value Ref Range   aPTT 34 24 - 36 seconds    Comment: Performed at [facility], [place] [address], [place], [place] [address]  Protime-INR     Status: Abnormal   Collection Time: 09/11/19 12:13 AM  Result Value Ref Range   Prothrombin Time 16.8 (H) 11.4 - 15.2 seconds   INR 1.4 (H) 0.8 - 1.2    Comment: (NOTE) INR goal varies based on device and disease states. Performed at [facility], [place] [address] [place], [place] [address]   Heparin level (unfractionated)     Status: Abnormal   Collection Time: 09/11/19 12:13 AM  Result Value Ref Range   Heparin Unfractionated 1.90 (H) 0.30 - 0.70 IU/mL    Comment: RESULTS CONFIRMED BY MANUAL DILUTION Performed at [place] [address], [place], [place] [address]   C-reactive protein     Status: Abnormal   Collection Time: 09/11/19 12:13 AM  Result Value Ref Range   CRP 4.1 (H) <1.0 mg/dL    Comment: Performed at [facility], [place] [address], [place],  [address]   DG Chest Port 1 View  Result Date: 09/10/2019 CLINICAL DATA:  Shortness of breath, fever and  hypoxia. EXAM: PORTABLE CHEST 1 VIEW COMPARISON:  August 08, 2019 FINDINGS: Mild infiltrate is seen within the right lung base. There is no evidence of a pleural effusion or pneumothorax. The heart size and mediastinal  contours are within normal limits. The visualized skeletal structures are unremarkable. IMPRESSION: Mild right basilar infiltrate. Electronically Signed   By: Virgina [place] M.D.   On: 09/10/2019 20:58    Pending Labs Unresulted Labs (From admission, onward)    Start     Ordered   09/11/19 0500  CBC  Tomorrow morning,   R     09/10/19 2316   09/11/19 0500  Comprehensive metabolic panel  Tomorrow morning,   R     09/10/19 2316   09/11/19 0500  C-reactive protein  Tomorrow morning,   R     09/10/19 2316   09/10/19 2317  MRSA PCR Screening  Once,   STAT     09/10/19 2316   09/10/19 2312  HIV Antibody (routine testing w rflx)  (HIV Antibody (Routine testing w reflex) panel)  Once,   STAT     09/10/19 2316   09/10/19 2007  Blood Culture (routine x 2)  BLOOD CULTURE X 2,   STAT     09/10/19 2006   09/10/19 2007  Urinalysis, Routine w reflex microscopic  Once,   STAT     09/10/19 2006   09/10/19 2007  Urine culture  ONCE - STAT,   STAT     09/10/19 2006          Vitals/Pain Today's Vitals   09/10/19 2300 09/11/19 0030 09/11/19 0130 09/11/19 0200  BP: 107/60 107/67 111/65 108/67  Pulse: 80 73 75 74  Resp: 20 (!) 23 20 (!) 22  Temp:      TempSrc:      SpO2: 97% 98% 97% 98%  Weight:      Height:      PainSc:        Isolation Precautions No active isolations  Medications Medications  feeding supplement (ENSURE ENLIVE) (ENSURE ENLIVE) liquid 237 mL (has no administration in time range)  gabapentin (NEURONTIN) capsule 300 mg (has no administration in time range)  simvastatin (ZOCOR) tablet 10 mg (10 mg Oral Given 09/10/19 2359)  zolpidem (AMBIEN) tablet 5 mg (has no administration in time range)  finasteride (PROSCAR) tablet 5 mg (5 mg Oral Given 09/10/19 2359)  cefTRIAXone (ROCEPHIN) 2 g in sodium chloride 0.9 % 100 mL IVPB (has no administration in time range)  azithromycin (ZITHROMAX) 500 mg in sodium chloride 0.9 % 250 mL IVPB (500 mg Intravenous New Bag/Given 09/11/19  0257)  heparin bolus via infusion 3,000 Units (has no administration in time range)  heparin ADULT infusion 100 units/mL (25000 units/228m sodium chloride 0.45%) (has no administration in time range)  ceFEPIme (MAXIPIME) 2 g in sodium chloride 0.9 % 100 mL IVPB (0 g Intravenous Stopped 09/10/19 2343)  vancomycin (VANCOREADY) IVPB 1500 mg/300 mL (0 mg Intravenous Stopped 09/11/19 0257)  sodium chloride 0.9 % bolus 1,000 mL (0 mLs Intravenous Stopped 09/11/19 0257)  sodium chloride (PF) 0.9 % injection (  Given 09/11/19 0107)    Mobility walks

## 2019-09-11 NOTE — Progress Notes (Signed)
ANTICOAGULATION CONSULT NOTE - Initial Consult  Pharmacy Consult for IV heparin Indication: DVT r/o PE (on PTA eliquis)  No Known Allergies  Patient Measurements: Height: 5\' 10"  (177.8 cm) Weight: 165 lb (74.8 kg) IBW/kg (Calculated) : 73 Heparin Dosing Weight: actual  Vital Signs: Temp: 101.4 F (38.6 C) (01/24 2012) Temp Source: Rectal (01/24 2012) BP: 107/60 (01/24 2300) Pulse Rate: 80 (01/24 2300)  Labs: Recent Labs    09/10/19 2020  HGB 12.7*  HCT 40.8  PLT 279  CREATININE 1.75*    Estimated Creatinine Clearance: 30.1 mL/min (A) (by C-G formula based on SCr of 1.75 mg/dL (H)).   Medical History: Past Medical History:  Diagnosis Date  . Hepatitis B   . HOH (hard of hearing)   . Hyperlipidemia   . Hypertension   . Stroke ([place])   . UTI (urinary tract infection)     Medications:  Scheduled:  . feeding supplement (ENSURE ENLIVE)  237 mL Oral BID BM  . finasteride  5 mg Oral QHS  . simvastatin  10 mg Oral QHS  . sodium chloride (PF)       Infusions:  . azithromycin    . cefTRIAXone (ROCEPHIN)  IV    . vancomycin 1,500 mg (09/10/19 2357)    Assessment: 52 yoM with hx of DVT on eliquis now with acute resp failure with hypoxia. VQ scan if Covid negative to r/o PE. MD wants IV heparin in case pt has failed eliquis. LD eliquis 1/24 0700  Baseline: H/H= 12.7/40.8, plts = 279, aptt = 34, INR = 1.4, HL = 1.9  Goal of Therapy:  aptt = 62-102 Heparin level 0.3-0.7 units/ml Monitor platelets by anticoagulation protocol: Yes   Plan:  Baseline aptt/HL Heparin 3000 unit IV bolus x1 Start drip at 1200 units/hr Daily CBC Will use aptt to follow since HL is elevated d/t recent Doac use     Dorrene German 09/11/2019,12:41 AM

## 2019-09-11 NOTE — Progress Notes (Addendum)
PROGRESS NOTE  Barry Swanson:[id number] DOB: 01/02/1929 DOA: 09/10/2019 PCP: [name], MD  HPI/Recap of past 24 hours: HPI from Dr Vivien Rota is a 86 y.o. male with medical history significant of HTN, prior stroke, CKD stage 3. Admitted in Dec for AMS due to UTI, complicated by DVT. Discharged on eliquis which he has remained on. Pt presents to ED with 2 day history of worsening fever and SOB. Tm at home 103, no cough. No known COVID exposures.  In the ED, temp 101.4, satting in the upper 80s on room air, improved to mid 90s on 3 L via [place].  D-dimer 0.53, WBC 10,000, procalcitonin 0.14, Covid POC and PCR negative.  Chest x-ray shows right lower lobe infiltrate.  Patient admitted for further management.    Today, patient noted to still be requiring about 3 L of O2, appears lethargic, confused.  Denies any chest pain, abdominal pain, nausea/vomiting, fever/chills.    Assessment/Plan: Principal Problem:   Right lower lobe pneumonia Active Problems:   Essential hypertension   CKD (chronic kidney disease) stage 3, GFR 30-59 ml/min   Acute respiratory failure with hypoxia (HCC)   Fever   History of DVT (deep vein thrombosis)  Sepsis POA, likely 2/2 HCAP Vs aspiration pneumonia r/o PE Acute hypoxic respiratory failure Currently afebrile, with worsening leukocytosis Currently requiring about 3 L of O2, but satting well in the 90s, plan to wean off Lactic acid 2, trended downwards to normal Procalcitonin 0.14, will trend CRP elevated, ferritin, LDH both within normal limits BC x2 pending UA, UC pending Chest x-ray showed mild right basilar infiltrate VQ scan pending Gentle IV fluids SLP to assess for aspiration risk, patient was already placed on a pured diet prior to arrival Switch to cefepime, continue azithromycin.  MRSA PCR negative, no need for Vanco Monitor closely  History of LLE acute DVT Doppler on 08/09/19 showed DVT noted in the left gastrocnemius  veins Currently on IV Heparin due to n.p.o. status Hold home Eliquis for now  Dysphagia likely 2/2 Schatzki ring Status post esophageal dilatation on 06/29/2019, by Dr. Maryann Alar to have trouble swallowing SLP consulted on last admission, recommend dysphagia type III diet, reconsulted for further evaluation Gentle IV fluids May need GI follow up while admitted  Chronic diastolic HF Appears compensated BNP pending Echo with grade 1 diastolic dysfunction, with preserved EF  CKD stage IIIb Creatinine stable at baseline Daily BMP  H/O CVA/hyperlipidemia Continue simvastatin, ASA   BPH Continue Proscar  Poor prognosis, patient with likely new onset dementia, with difficulty swallowing, advanced age, recurrent admission.        Malnutrition Type:      Malnutrition Characteristics:      Nutrition Interventions:       Estimated body mass index is 23.68 kg/m as calculated from the following:   Height as of this encounter: 5\' 10"  (1.778 m).   Weight as of this encounter: 74.8 kg.     Code Status: Full  Family Communication: Discussed with daughter on 09/11/2019 about overall prognosis for patient, goals of care.  Daughter stated she thinks patient may be at initial stages of dementia, would like to discuss goals of care with her sister and will get back to me.  Disposition Plan: To be determined, PT/OT evaluation pending   Consultants: None  Procedures:  None  Antimicrobials:  Azithromycin  Cefepime  DVT prophylaxis: IV Heparin   Objective: Vitals:   09/11/19 0200 09/11/19 0359 09/11/19 0432 09/11/19  0516  BP: 108/67  (!) 91/53 (!) 99/59  Pulse: 74  68 64  Resp: (!) 22  20   Temp:  98.4 F (36.9 C) 98 F (36.7 C)   TempSrc:  Oral Oral   SpO2: 98%  100% 96%  Weight:      Height:        Intake/Output Summary (Last 24 hours) at 09/11/2019 1143 Last data filed at 09/11/2019 1125 Gross per 24 hour  Intake 1571.76 ml  Output 275  ml  Net 1296.76 ml   Filed Weights   09/10/19 2033  Weight: 74.8 kg    Exam:  General: NAD, lethargic, chronically ill-appearing, deconditioned  Cardiovascular: S1, S2 present  Respiratory:  Diminished breath sounds bilaterally  Abdomen: Soft, nontender, nondistended, bowel sounds present  Musculoskeletal: No bilateral pedal edema noted  Skin: Normal  Psychiatry: Normal mood   Data Reviewed: CBC: Recent Labs  Lab 09/10/19 2020 09/11/19 0447  WBC 10.0 24.7*  NEUTROABS 8.3*  --   HGB 12.7* 12.4*  HCT 40.8 41.7  MCV 90.7 94.6  PLT 279 542   Basic Metabolic Panel: Recent Labs  Lab 09/10/19 2020 09/11/19 0447  NA 140 141  K 3.9 4.2  CL 106 108  CO2 23 26  GLUCOSE 157* 142*  BUN 18 19  CREATININE 1.75* 1.77*  CALCIUM 9.2 8.7*   GFR: Estimated Creatinine Clearance: 29.8 mL/min (A) (by C-G formula based on SCr of 1.77 mg/dL (H)). Liver Function Tests: Recent Labs  Lab 09/10/19 2020 09/11/19 0447  AST 20 17  ALT 15 16  ALKPHOS 57 53  BILITOT 0.6 0.7  PROT 5.6* 5.6*  ALBUMIN 3.0* 2.8*   No results for input(s): LIPASE, AMYLASE in the last 168 hours. No results for input(s): AMMONIA in the last 168 hours. Coagulation Profile: Recent Labs  Lab 09/11/19 0013  INR 1.4*   Cardiac Enzymes: No results for input(s): CKTOTAL, CKMB, CKMBINDEX, TROPONINI in the last 168 hours. BNP (last 3 results) No results for input(s): PROBNP in the last 8760 hours. HbA1C: No results for input(s): HGBA1C in the last 72 hours. CBG: No results for input(s): GLUCAP in the last 168 hours. Lipid Profile: Recent Labs    09/10/19 2020  TRIG 111   Thyroid Function Tests: No results for input(s): TSH, T4TOTAL, FREET4, T3FREE, THYROIDAB in the last 72 hours. Anemia Panel: Recent Labs    09/10/19 2020  FERRITIN 36   Urine analysis:    Component Value Date/Time   COLORURINE YELLOW 08/08/2019 1739   APPEARANCEUR TURBID (A) 08/08/2019 1739   LABSPEC 1.009  08/08/2019 1739   PHURINE 5.0 08/08/2019 1739   GLUCOSEU NEGATIVE 08/08/2019 1739   HGBUR LARGE (A) 08/08/2019 1739   BILIRUBINUR NEGATIVE 08/08/2019 1739   KETONESUR NEGATIVE 08/08/2019 1739   PROTEINUR 100 (A) 08/08/2019 1739   UROBILINOGEN 1.0 02/28/2015 1550   NITRITE NEGATIVE 08/08/2019 1739   LEUKOCYTESUR LARGE (A) 08/08/2019 1739   Sepsis Labs: @LABRCNTIP (procalcitonin:4,lacticidven:4)  ) Recent Results (from the past 240 hour(s))  Blood Culture (routine x 2)     Status: None (Preliminary result)   Collection Time: 09/10/19  8:20 PM   Specimen: BLOOD  Result Value Ref Range Status   Specimen Description BLOOD BLOOD RIGHT FOREARM  Final   Special Requests   Final    BOTTLES DRAWN AEROBIC AND ANAEROBIC Blood Culture results may not be optimal due to an inadequate volume of blood received in culture bottles Performed at [facility], [address] [place], [place] [address]    Culture PENDING  Incomplete   Report Status PENDING  Incomplete  Respiratory Panel by RT PCR (Flu A&B, Covid) - Nasopharyngeal Swab     Status: None   Collection Time: 09/10/19 10:18 PM   Specimen: Nasopharyngeal Swab  Result Value Ref Range Status   SARS Coronavirus 2 by RT PCR NEGATIVE NEGATIVE Final    Comment: (NOTE) SARS-CoV-2 target nucleic acids are NOT DETECTED. The SARS-CoV-2 RNA is generally detectable in upper respiratoy specimens during the acute phase of infection. The lowest concentration of SARS-CoV-2 viral copies this assay can detect is 131 copies/mL. A negative result does not preclude SARS-Cov-2 infection and should not be used as the sole basis for treatment or other patient management decisions. A negative result may occur with  improper specimen collection/handling, submission of specimen other than nasopharyngeal swab, presence of viral mutation(s) within the areas targeted by this assay, and inadequate number of viral copies (<131 copies/mL). A negative  result must be combined with clinical observations, patient history, and epidemiological information. The expected result is Negative. Fact Sheet for Patients:  PinkCheek.be Fact Sheet for Healthcare Providers:  GravelBags.it This test is not yet ap proved or cleared by the [place] FDA and  has been authorized for detection and/or diagnosis of SARS-CoV-2 by FDA under an Emergency Use Authorization (EUA). This EUA will remain  in effect (meaning this test can be used) for the duration of the COVID-19 declaration under Section 564(b)(1) of the Act, 21 U.S.C. section 360bbb-3(b)(1), unless the authorization is terminated or revoked sooner.    Influenza A by PCR NEGATIVE NEGATIVE Final   Influenza B by PCR NEGATIVE NEGATIVE Final    Comment: (NOTE) The Xpert Xpress SARS-CoV-2/FLU/RSV assay is intended as an aid in  the diagnosis of influenza from Nasopharyngeal swab specimens and  should not be used as a sole basis for treatment. Nasal washings and  aspirates are unacceptable for Xpert Xpress SARS-CoV-2/FLU/RSV  testing. Fact Sheet for Patients: PinkCheek.be Fact Sheet for Healthcare Providers: GravelBags.it This test is not yet approved or cleared by the [place] FDA and  has been authorized for detection and/or diagnosis of SARS-CoV-2 by  FDA under an Emergency Use Authorization (EUA). This EUA will remain  in effect (meaning this test can be used) for the duration of the  Covid-19 declaration under Section 564(b)(1) of the Act, 21  U.S.C. section 360bbb-3(b)(1), unless the authorization is  terminated or revoked. Performed at [facility], [place] [address] [place], [place] [address]   MRSA PCR Screening     Status: None   Collection Time: 09/10/19 11:17 PM   Specimen: Nasal Mucosa; Nasopharyngeal  Result Value Ref Range Status   MRSA by  PCR NEGATIVE NEGATIVE Final    Comment:        The GeneXpert MRSA Assay (FDA approved for NASAL specimens only), is one component of a comprehensive MRSA colonization surveillance program. It is not intended to diagnose MRSA infection nor to guide or monitor treatment for MRSA infections. Performed at [facility], [place] [address] [place], [place] [address]       Studies: DG Chest Port 1 View  Result Date: 09/10/2019 CLINICAL DATA:  Shortness of breath, fever and hypoxia. EXAM: PORTABLE CHEST 1 VIEW COMPARISON:  August 08, 2019 FINDINGS: Mild infiltrate is seen within the right lung base. There is no evidence of a pleural effusion or pneumothorax. The heart size and mediastinal contours  are within normal limits. The visualized skeletal structures are unremarkable. IMPRESSION: Mild right basilar infiltrate. Electronically Signed   By: Virgina Norfolk M.D.   On: 09/10/2019 20:58    Scheduled Meds: . feeding supplement (ENSURE ENLIVE)  237 mL Oral BID BM  . finasteride  5 mg Oral QHS  . simvastatin  10 mg Oral QHS    Continuous Infusions: . sodium chloride 100 mL/hr at 09/11/19 0800  . azithromycin Stopped (09/11/19 0359)  . ceFEPime (MAXIPIME) IV 2 g (09/11/19 1017)  . heparin 1,200 Units/hr (09/11/19 0427)     LOS: 1 day     Alma Friendly, MD [facility]  If 7PM-7AM, please contact night-coverage [email or web address] 09/11/2019, 11:43 AM

## 2019-09-11 NOTE — Progress Notes (Signed)
Initial Nutrition Assessment  DOCUMENTATION CODES:   Not applicable  INTERVENTION:  - continue Ensure Enlive po BID, each supplement provides 350 kcal and 20 grams of protein - will order daily multivitamin with minerals. - continue to encourage PO intakes and advance diet as medically feasible.    NUTRITION DIAGNOSIS:   Increased nutrient needs related to acute illness as evidenced by estimated needs.  GOAL:   Patient will meet greater than or equal to 90% of their needs  MONITOR:   PO intake, Supplement acceptance, Diet advancement, Labs, Weight trends  REASON FOR ASSESSMENT:   Malnutrition Screening Tool  ASSESSMENT:   86 y.o. male with medical history of HTN, prior stroke, and stage 3 CKD. He was admitted in December for AMS due to UTI, complicated by DVT. Discharged on eliquis which he has remained on. Patient presented to the ED with 2 day history of worsening fever and SOB. In the ED, temp 101.4, sating in the upper 80s on room air, improved to mid 90s on 3L via [place]. CXR indicated RLL infiltrate.  Patient is out of the room to Nuclear Med; unable to obtain any nutrition-related information or complete NFPE. Patient has not been seen by a [facility] at any time in the past. Diet advanced from NPO to [place] today at 1220; no intakes yet.   Per chart review, weight on 1/24 was 165 lb and weight on 08/15/19 was 175 lb. This indicates 10 lb weight loss (5.7% body weight) in the past 1 month; significant for time frame.   Per notes: - sepsis--thought to be due to HCAP vs aspiration PNA - acute hypoxic respiratory failure - dysphagia thought to be 2/2 Schatzki ring--esophageal dilation on 06/29/19; plan for SLP assessment/evaluation; on pureed diet PTA   Labs reviewed; creatinine: 1.77 mg/dl, Ca: 8.7 mg/dl, GFR: 34 ml/min. Medications reviewed.  IVF; NS @ 75 ml/hr.   NUTRITION - FOCUSED PHYSICAL EXAM:  unable to complete at this time.   Diet Order:   Diet Order            Diet full liquid Room service appropriate? Yes; Fluid consistency: Thin  Diet effective now              EDUCATION NEEDS:   No education needs have been identified at this time  Skin:  Skin Assessment: Reviewed RN Assessment  Last BM:  1/24  Height:   Ht Readings from Last 1 Encounters:  09/10/19 5\' 10"  (1.778 m)    Weight:   Wt Readings from Last 1 Encounters:  09/10/19 74.8 kg    Ideal Body Weight:  75.4 kg  BMI:  Body mass index is 23.68 kg/m.  Estimated Nutritional Needs:   Kcal:  1800-2000 kcal  Protein:  80-90 grams  Fluid:  >/= 2 L/day     Jarome Matin, MS, RD, LDN, [facility] Inpatient Clinical Dietitian Pager # [phone] After hours/weekend pager # [phone]

## 2019-09-11 NOTE — Progress Notes (Signed)
Pt daughter called. Updates given on pt status, listed as pt visitor.   Daughter also stating that pt was on a puree diet at home and was to have esophagus stretched due to difficulty swallowing and chocking when attempting to swallow. Speech consult in place.  Will hold of on giving pt anything oral until such is complete. Will pass to oncoming RN.

## 2019-09-11 NOTE — Progress Notes (Signed)
PHARMACY NOTE -  Cefepime  Pharmacy has been assisting with dosing of Cefepime for HCAP.  Dosage remains stable at 2g IV q12 hr  Pharmacy will sign off, following peripherally for culture results or dose adjustments. Please reconsult if a change in clinical status warrants re-evaluation of dosage.  Reuel Boom, PharmD, BCPS [phone] 09/11/2019, 8:01 AM

## 2019-09-11 NOTE — Progress Notes (Signed)
Pharmacy - Heparin >> Eliquis  Assessment:    Please see note from Delle Reining, PharmD earlier today for full details.  Briefly, 86 y.o. male on IV heparin for r/o PE while Eliquis on hold   VQ scan negative for PE; per MD ok to transition back to Eliquis  APTT slightly low at 62 sec on 1200 units/hr  Plan:   Increase heparin to 1300 units/hr for now  Resume Eliquis 5 mg PO bid tonight, stopping heparin with first dose of Eliquis  Reuel Boom, PharmD, BCPS [phone] 09/11/2019, 3:18 PM

## 2019-09-11 NOTE — Progress Notes (Signed)
COVID negative, RLL CAP now leading diagnosis, will order VQ scan for AM though.

## 2019-09-11 NOTE — Evaluation (Signed)
Clinical/Bedside Swallow Evaluation Patient Details  Name: Barry Swanson MRN: [id number] Date of Birth: Feb 20, 1929  Today's Date: 09/11/2019 Time: SLP Start Time (ACUTE ONLY): 1120 SLP Stop Time (ACUTE ONLY): 1208 SLP Time Calculation (min) (ACUTE ONLY): 48 min  Past Medical History:  Past Medical History:  Diagnosis Date  . Hepatitis B   . HOH (hard of hearing)   . Hyperlipidemia   . Hypertension   . Stroke ([place])   . UTI (urinary tract infection)    Past Surgical History:  Past Surgical History:  Procedure Laterality Date  . BALLOON DILATION N/A 06/29/2019   Procedure: BALLOON DILATION;  Surgeon: Wonda Horner, MD;  Location: [facility];  Service: Endoscopy;  Laterality: N/A;  . BLADDER STONE REMOVAL     blasted  . CATARACT EXTRACTION W/ INTRAOCULAR LENS  IMPLANT, BILATERAL  2004  . CYSTOSCOPY WITH LITHOLAPAXY N/A 06/01/2019   Procedure: CYSTOSCOPY WITH LITHOLAPAXY;  Surgeon: Ardis Hughs, MD;  Location: [facility];  Service: Urology;  Laterality: N/A;  . ESOPHAGOGASTRODUODENOSCOPY (EGD) WITH PROPOFOL N/A 06/29/2019   Procedure: ESOPHAGOGASTRODUODENOSCOPY (EGD) WITH PROPOFOL WITH POSSIBLE DIL;  Surgeon: Wonda Horner, MD;  Location: [facility];  Service: Endoscopy;  Laterality: N/A;  . PROSTATE SURGERY     trimmed prostate away from urethral tract   HPI:  86 yo male adm to [facility] with respiratory difficulties, found to have RLL pna.  Pt with h/o esophageal dysmotility, ? Schatzki's ring s/p dilatation 06/2019.  He reports to this SLP, sensation of food lodging in left side of throat and states if he swallows on the right, it goes down better.  He does cough and bring up food/drink with secretions per his statement with direct question cue.  Pt states his problems have been going on "for years" and denies worsening.  He indicated his "throat was stretched" = pointing to left lower throat. SLP printed his esophagram and endoscopy report reviewing that he had distal esophagus  dilated at the [place].  Pt PMH + for prostate cancer, TIA, DVT.  He was recently in the hospital in December 2020.  Swallow evaluation ordered.   Assessment / Plan / Recommendation Clinical Impression  Pt has known h/o esophageal dysmotility and distal narrowing *?Schatzki's ring* that was dilated in November 2020.  SLP can not rule out pharyngo-esophageal dysphagia most notably as pt isolates symptoms to food/drink lodging in left side of neck - pointing distal neck region.  His CN exam is unremarkable - has h/o TIA vs CVA.  Few boluses of po able to be observed due to pt's need to travel for VQ scan.  Multiple audible swallows noted across all consistencies including thin Boost Breeze, Ensure and applesauce.  SlP role is aspiration mitigation and largest risk appears to be due to known moderate severe esophageal dysmotlity.  Risk of backflowing food/drink into pharynx/larynx present, thus mitigation strategies indicated.   Note per procedure orders, pt is scheduled to have repeat endoscopy early 09/2019.  As pt as on puree diet at home and hospital puree is viscous, full liquid advised at this time to help mitigate risk.  Advised pt to consume small frequent amounts, staying upright and trying warm drinks.    Will follow up. SLP Visit Diagnosis: Dysphagia, unspecified (R13.10)    Aspiration Risk  Moderate aspiration risk    Diet Recommendation Thin liquid(full liquid)   Supervision: Patient able to self feed Compensations: Slow rate;Small sips/bites(start all intake with warm liquids) Postural Changes: Remain upright for at least  30 minutes after po intake;Seated upright at 90 degrees    Other  Recommendations Oral Care Recommendations: Oral care BID   Follow up Recommendations (tbd)      Frequency and Duration min 2x/week  2 weeks       Prognosis Prognosis for Safe Diet Advancement: Fair      Swallow Study   General Date of Onset: 05/04/19 HPI: 86 yo male adm to [facility] with respiratory  difficulties, found to have RLL pna.  Pt with h/o esophageal dysmotility, ? Schatzki's ring s/p dilatation 06/2019.  He reports to this SLP, sensation of food lodging in left side of throat and states if he swallows on the right, it goes down better.  He does cough and bring up food/drink with secretions per his statement with direct question cue.  Pt states his problems have been going on "for years" and denies worsening.  He indicated his "throat was stretched" = pointing to left lower throat. SLP printed his esophagram and endoscopy report reviewing that he had distal esophagus dilated at the [facility].  Pt PMH + for prostate cancer, TIA, DVT.  He was recently in the hospital in December 2020.  Swallow evaluation ordered. Type of Study: Bedside Swallow Evaluation Previous Swallow Assessment: see hpi Diet Prior to this Study: NPO Temperature Spikes Noted: Yes(101.4) Respiratory Status: Nasal cannula History of Recent Intubation: No Behavior/Cognition: Alert;Cooperative;Pleasant mood;Other (Comment) Oral Cavity Assessment: Excessive secretions Oral Care Completed by SLP: No Oral Cavity - Dentition: Dentures, top(lower dentures are at home, pt reports he eats with dentures, inquired if daughter could bring them) Self-Feeding Abilities: Able to feed self Patient Positioning: Upright in bed Baseline Vocal Quality: Low vocal intensity Volitional Cough: Weak Volitional Swallow: Unable to elicit    Oral/Motor/Sensory Function Overall Oral Motor/Sensory Function: Generalized oral weakness   Ice Chips Ice chips: Not tested   Thin Liquid Thin Liquid: Impaired Presentation: Straw Pharyngeal  Phase Impairments: Cough - Delayed;Multiple swallows    Nectar Thick Nectar Thick Liquid: Impaired Pharyngeal Phase Impairments: Multiple swallows   Honey Thick Honey Thick Liquid: Not tested   Puree Puree: Impaired Pharyngeal Phase Impairments: Multiple swallows Other Comments: pt reports if he puts food/drink on  the right side of his mouth it will clear better through the right side of his neck into his esophagus   Solid     Solid: Not tested Other Comments: pt on puree diet at home      [name] 09/11/2019,1:02 PM  Kathleen Lime, MS [place] Office [phone]

## 2019-09-12 LAB — CBC
HCT: 36.5 % — ABNORMAL LOW (ref 39.0–52.0)
Hemoglobin: 10.7 g/dL — ABNORMAL LOW (ref 13.0–17.0)
MCH: 27.9 pg (ref 26.0–34.0)
MCHC: 29.3 g/dL — ABNORMAL LOW (ref 30.0–36.0)
MCV: 95.3 fL (ref 80.0–100.0)
Platelets: 236 10*3/uL (ref 150–400)
RBC: 3.83 MIL/uL — ABNORMAL LOW (ref 4.22–5.81)
RDW: 15.7 % — ABNORMAL HIGH (ref 11.5–15.5)
WBC: 13.4 10*3/uL — ABNORMAL HIGH (ref 4.0–10.5)
nRBC: 0 % (ref 0.0–0.2)

## 2019-09-12 LAB — URINE CULTURE: Culture: <10000 — AB

## 2019-09-12 LAB — PROCALCITONIN: Procalcitonin: 2.03 ng/mL

## 2019-09-12 MED ORDER — SODIUM CHLORIDE 0.9 % IV SOLN: INTRAVENOUS | Status: AC

## 2019-09-12 MED ORDER — LORAZEPAM 2 MG/ML IJ SOLN
1.0000 mg | Freq: Once | INTRAMUSCULAR | Status: AC
  Administered 2019-09-12: 1 mg via INTRAVENOUS
  Filled 2019-09-12: qty 1

## 2019-09-12 MED ORDER — AZITHROMYCIN 250 MG PO TABS: 500.0000 mg | ORAL_TABLET | Freq: Every day | ORAL | Status: DC

## 2019-09-12 MED ORDER — HALOPERIDOL LACTATE 5 MG/ML IJ SOLN
1.0000 mg | Freq: Once | INTRAMUSCULAR | Status: AC
  Administered 2019-09-12: 1 mg via INTRAMUSCULAR
  Filled 2019-09-12: qty 1

## 2019-09-12 NOTE — Progress Notes (Signed)
Physical Therapy Treatment Patient Details Name: Barry Swanson MRN: [id number] DOB: [birth year] Today's Date: 09/12/2019    History of Present Illness Barry Swanson is a 86 y.o. male with medical history significant of HTN, prior stroke, CKD stage 3.  Pt with recent admission in December for DVT.  Pt admitted with PNE.  Perfusion scan negative for PE.    PT Comments    Pt assisted to sitting however upon getting to EOB, pt upset and crying.  This did not subside and pt unable to state problem so assisted back to bed.  Follow Up Recommendations  Home health PT;Supervision/Assistance - 24 hour     Equipment Recommendations  None recommended by PT    Recommendations for Other Services       Precautions / Restrictions Precautions Precautions: Fall Precaution Comments: on 2L O2 [place]    Mobility  Bed Mobility Overal bed mobility: Needs Assistance Bed Mobility: Supine to Sit     Supine to sit: Min assist Sit to supine: Min assist   General bed mobility comments: assist for trunk upright and LEs onto bed, pt crying and appeared upset however unable to further explain, pt better with return to supine, RN reports pt has had these episodes and hx of anxiety  Transfers                 General transfer comment: pt upset and did not improve within 3 minutes so assisted back to bed  Ambulation/Gait                 Stairs             Wheelchair Mobility    Modified Rankin (Stroke Patients Only)       Balance                                            Cognition Arousal/Alertness: Awake/alert Behavior During Therapy: WFL for tasks assessed/performed Overall Cognitive Status: No family/caregiver present to determine baseline cognitive functioning                                 General Comments: pt emotionally labile during session, pt crying and stating he doesn't know what is wrong, denies pain (RN reports this has been  occurring throughout the day)      Exercises      General Comments        Pertinent Vitals/Pain Pain Assessment: No/denies pain    Home Living                      Prior Function            PT Goals (current goals can now be found in the care plan section) Progress towards PT goals: Progressing toward goals    Frequency    Min 3X/week      PT Plan Current plan remains appropriate    Co-evaluation              AM-PAC PT "6 Clicks" Mobility   Outcome Measure  Help needed turning from your back to your side while in a flat bed without using bedrails?: None Help needed moving from lying on your back to sitting on the side of a flat bed without using bedrails?: A Little Help needed moving to  and from a bed to a chair (including a wheelchair)?: A Little Help needed standing up from a chair using your arms (e.g., wheelchair or bedside chair)?: A Little Help needed to walk in hospital room?: A Little Help needed climbing 3-5 steps with a railing? : A Lot 6 Click Score: 18    End of Session Equipment Utilized During Treatment: Oxygen Activity Tolerance: Patient tolerated treatment well Patient left: with call bell/phone within reach;in bed;with bed alarm set   PT Visit Diagnosis: Other abnormalities of gait and mobility (R26.89);Muscle weakness (generalized) (M62.81)     Time: 0223-3612 PT Time Calculation (min) (ACUTE ONLY): 15 min  Charges:  $Therapeutic Activity: 8-22 mins                     Arlyce Dice, DPT Acute Rehabilitation Services Office: [phone]  York Ram E 09/12/2019, 4:20 PM

## 2019-09-12 NOTE — TOC Initial Note (Signed)
Transition of Care [facility]) - Initial/Assessment Note    Patient Details  Name: Barry Swanson MRN: [id number] Date of Birth: 10-Jul-1929  Transition of Care [facility]) CM/SW Contact:    Dessa Phi, RN Phone Number: 09/12/2019, 12:08 PM  Clinical Narrative: PT/OT recc HHC-Patient not able to answer questions-TC dtr Barry Swanson await call back to discuss [place], DME, & d/c plan.                  Expected Discharge Plan: [place] Barriers to Discharge: Continued Medical Work up   Patient Goals and CMS Choice Patient states their goals for this hospitalization and ongoing recovery are:: go home      Expected Discharge Plan and Services Expected Discharge Plan: [place]   Discharge Planning Services: CM Consult   Living arrangements for the past 2 months: Single Family Home                                      Prior Living Arrangements/Services Living arrangements for the past 2 months: Single Family Home Lives with:: Adult Children Patient language and need for interpreter reviewed:: Yes Do you feel safe going back to the place where you live?: Yes      Need for Family Participation in Patient Care: No (Comment) Care giver support system in place?: Yes (comment)   Criminal Activity/Legal Involvement Pertinent to Current Situation/Hospitalization: No - Comment as needed  Activities of Daily Living Home Assistive Devices/Equipment: Walker (specify type) ADL Screening (condition at time of admission) Patient's cognitive ability adequate to safely complete daily activities?: Yes Is the patient deaf or have difficulty hearing?: No Does the patient have difficulty seeing, even when wearing glasses/contacts?: No Does the patient have difficulty concentrating, remembering, or making decisions?: No Patient able to express need for assistance with ADLs?: Yes Does the patient have difficulty dressing or bathing?: Yes Independently performs  ADLs?: No Communication: Independent Dressing (OT): Needs assistance Is this a change from baseline?: Pre-admission baseline Grooming: Needs assistance Is this a change from baseline?: Pre-admission baseline Feeding: Independent Bathing: Needs assistance Is this a change from baseline?: Pre-admission baseline Toileting: Needs assistance Is this a change from baseline?: Pre-admission baseline In/Out Bed: Needs assistance Is this a change from baseline?: Pre-admission baseline Walks in Home: Needs assistance Is this a change from baseline?: Pre-admission baseline Does the patient have difficulty walking or climbing stairs?: Yes Weakness of Legs: None Weakness of Arms/Hands: None  Permission Sought/Granted Permission sought to share information with : Case Manager Permission granted to share information with : Yes, Verbal Permission Granted  Share Information with NAME: Case Manager  Permission granted to share info w AGENCY: [place] agencies  Permission granted to share info w Relationship: Barry Swanson dtr [phone]     Emotional Assessment Appearance:: Appears stated age Attitude/Demeanor/Rapport: Gracious Affect (typically observed): Accepting Orientation: : Oriented to Self, Oriented to Situation Alcohol / Substance Use: Not Applicable Psych Involvement: No (comment)  Admission diagnosis:  Acute respiratory failure with hypoxia ([place]) [J96.01] HAP (hospital-acquired pneumonia) [J18.9, Y95] Sepsis, due to unspecified organism, unspecified whether acute organ dysfunction present [facility]) [A41.9] Patient Active Problem List   Diagnosis Date Noted  . Right lower lobe pneumonia 09/10/2019  . Acute respiratory failure with hypoxia ([place]) 09/10/2019  . Fever 09/10/2019  . History of DVT (deep vein thrombosis) 09/10/2019  . Acute DVT (deep venous  thrombosis) ([place]) 08/14/2019  . Hematuria 08/14/2019  . CKD (chronic kidney disease) stage 3, GFR 30-59 ml/min 08/14/2019  . Dysphagia  08/14/2019  . AMS (altered mental status) 08/08/2019  . Bladder stone 06/01/2019  . Sepsis ([place]) 04/30/2019  . Acute pyelonephritis 04/30/2019  . Acute kidney injury ([place]) 04/30/2019  . Confusion   . Essential hypertension   . Acute encephalopathy 02/11/2015  . TIA (transient ischemic attack) 02/11/2015  . Hypertension   . Hyperlipidemia   . Calculus (=stone) 11/29/2012  . Malignant neoplasm of prostate ([place]) 11/29/2012  . Bladder outflow obstruction 11/29/2012   PCP:  [name], MD Pharmacy:   [facility] [place] [address] Phone: [phone] Fax: [phone]     Social Determinants of Health (SDOH) Interventions    Readmission Risk Interventions Readmission Risk Prevention Plan 09/12/2019 05/03/2019  Transportation Screening Complete Complete  PCP or Specialist Appt within 3-5 Days Complete Complete  HRI or [place] Complete Complete  Social Work Consult for [place] Planning/Counseling Complete Complete  Palliative Care Screening Not Applicable Not Applicable  Medication Review Press photographer) Complete Complete  Some recent data might be hidden

## 2019-09-12 NOTE — Progress Notes (Signed)
PROGRESS NOTE  Barry Swanson PFX:[id number] DOB: 1929/08/06 DOA: 09/10/2019 PCP: [name], MD  HPI/Recap of past 24 hours: HPI from [name] is a 86 y.o. male with medical history significant of HTN, prior stroke, CKD stage 3. Admitted in Dec for AMS due to UTI, complicated by DVT. Discharged on eliquis which he has remained on. Pt presents to ED with 2 day history of worsening fever and SOB. Tm at home 103, no cough. No known COVID exposures.  In the ED, temp 101.4, satting in the upper 80s on room air, improved to mid 90s on 3 L via [place].  D-dimer 0.53, WBC 10,000, procalcitonin 0.14, Covid POC and PCR negative.  Chest x-ray shows right lower lobe infiltrate.  Patient admitted for further management.    Today, pt noted to be coughing excessively, even with diet modification, feels very anxious/miserable, noted to be tearful. Denies any chest pain, abdominal pain, nausea/vomiting, fever/chills.    Assessment/Plan: Principal Problem:   Right lower lobe pneumonia Active Problems:   Essential hypertension   CKD (chronic kidney disease) stage 3, GFR 30-59 ml/min   Acute respiratory failure with hypoxia (HCC)   Fever   History of DVT (deep vein thrombosis)  Sepsis POA, likely 2/2 HCAP Vs aspiration pneumonia r/o PE Acute hypoxic respiratory failure Currently afebrile, with leukocytosis Currently requiring about 3 L of O2, but satting well in the 90s, plan to wean off Lactic acid 2, trended downwards to normal Procalcitonin 0.14--> 2.77--> 2.03 CRP elevated, ferritin, LDH both within normal limits BC x2 NGTD UA, UC with insignificant growth Chest x-ray showed mild right basilar infiltrate VQ scan negative for PE Gentle IV fluids SLP on board, significant aspiration risk regardless of diet modification, recommend n.p.o. except for sips with meds, ice chips Switch to cefepime, continue azithromycin Monitor closely  History of LLE acute DVT Doppler on 08/09/19  showed DVT noted in the left gastrocnemius veins Continue home Eliquis for now  Dysphagia likely 2/2 [place] ring Status post esophageal dilatation on 06/29/2019, by Dr. Maryann Alar to have significant trouble swallowing SLP on board as mentioned above Gentle IV fluids Discussed with GI, Dr. Watt Climes on 09/11/2019, recommend outpatient esophageal dilatation, although stated it would not be very helpful... Recommending possible PEG tube pending GOC discussions  Chronic diastolic HF Appears compensated BNP 270 Echo with grade 1 diastolic dysfunction, with preserved EF  CKD stage IIIb Creatinine stable at baseline Daily BMP  H/O CVA/hyperlipidemia Continue simvastatin, ASA   BPH Continue Proscar  GOC discussion  Poor prognosis, patient with likely new onset dementia, with difficulty swallowing, advanced age, recurrent admission. Palliative care consulted       Malnutrition Type:  Nutrition Problem: Increased nutrient needs Etiology: acute illness   Malnutrition Characteristics:  Signs/Symptoms: estimated needs   Nutrition Interventions:  Interventions: Ensure Enlive (each supplement provides 350kcal and 20 grams of protein), MVI    Estimated body mass index is 23.68 kg/m as calculated from the following:   Height as of this encounter: 5\' 10"  (1.778 m).   Weight as of this encounter: 74.8 kg.     Code Status: Full  Family Communication:  Discussed with daughter on 09/12/2019 about overall prognosis for patient, goals of care.  Daughter stated she discussed with her sister and they are in agreement for a DNR CODE STATUS.  Family interested in talking to palliative/hospice team for further goals of care discussion  Disposition Plan: To be determined, pending [place] conversations  Consultants: Talk to GI, Dr. Watt Climes on 09/11/2019 Palliative care consulted  Procedures:  None  Antimicrobials:  Azithromycin  Cefepime  DVT  prophylaxis: Eliquis   Objective: Vitals:   09/11/19 2139 09/12/19 0446 09/12/19 1353 09/12/19 1354  BP: 119/64 113/70 124/76   Pulse: 71 71 71   Resp: 19 20 19    Temp: 98.3 F (36.8 C) 97.7 F (36.5 C)  (!) 97.4 F (36.3 C)  TempSrc: Oral   Oral  SpO2: 99% 98% 95%   Weight:      Height:        Intake/Output Summary (Last 24 hours) at 09/12/2019 1746 Last data filed at 09/12/2019 1738 Gross per 24 hour  Intake 1952.93 ml  Output 1450 ml  Net 502.93 ml   Filed Weights   09/10/19 2033  Weight: 74.8 kg    Exam:  General: NAD, lethargic, chronically ill-appearing, deconditioned  Cardiovascular: S1, S2 present  Respiratory:  Coarse breath sounds bilaterally  Abdomen: Soft, nontender, nondistended, bowel sounds present  Musculoskeletal: No bilateral pedal edema noted  Skin: Normal  Psychiatry:  Tearful/anxious mood   Data Reviewed: CBC: Recent Labs  Lab 09/10/19 2020 09/11/19 0447 09/12/19 0411  WBC 10.0 24.7* 13.4*  NEUTROABS 8.3*  --   --   HGB 12.7* 12.4* 10.7*  HCT 40.8 41.7 36.5*  MCV 90.7 94.6 95.3  PLT 279 286 672   Basic Metabolic Panel: Recent Labs  Lab 09/10/19 2020 09/11/19 0447  NA 140 141  K 3.9 4.2  CL 106 108  CO2 23 26  GLUCOSE 157* 142*  BUN 18 19  CREATININE 1.75* 1.77*  CALCIUM 9.2 8.7*   GFR: Estimated Creatinine Clearance: 29.8 mL/min (A) (by C-G formula based on SCr of 1.77 mg/dL (H)). Liver Function Tests: Recent Labs  Lab 09/10/19 2020 09/11/19 0447  AST 20 17  ALT 15 16  ALKPHOS 57 53  BILITOT 0.6 0.7  PROT 5.6* 5.6*  ALBUMIN 3.0* 2.8*   No results for input(s): LIPASE, AMYLASE in the last 168 hours. No results for input(s): AMMONIA in the last 168 hours. Coagulation Profile: Recent Labs  Lab 09/11/19 0013  INR 1.4*   Cardiac Enzymes: No results for input(s): CKTOTAL, CKMB, CKMBINDEX, TROPONINI in the last 168 hours. BNP (last 3 results) No results for input(s): PROBNP in the last 8760  hours. HbA1C: No results for input(s): HGBA1C in the last 72 hours. CBG: No results for input(s): GLUCAP in the last 168 hours. Lipid Profile: Recent Labs    09/10/19 2020  TRIG 111   Thyroid Function Tests: No results for input(s): TSH, T4TOTAL, FREET4, T3FREE, THYROIDAB in the last 72 hours. Anemia Panel: Recent Labs    09/10/19 2020  FERRITIN 36   Urine analysis:    Component Value Date/Time   COLORURINE YELLOW 09/10/2019 2020   APPEARANCEUR CLOUDY (A) 09/10/2019 2020   LABSPEC 1.015 09/10/2019 2020   PHURINE 5.0 09/10/2019 2020   GLUCOSEU NEGATIVE 09/10/2019 2020   HGBUR MODERATE (A) 09/10/2019 2020   BILIRUBINUR NEGATIVE 09/10/2019 2020   [place] 09/10/2019 2020   PROTEINUR 100 (A) 09/10/2019 2020   UROBILINOGEN 1.0 02/28/2015 1550   NITRITE NEGATIVE 09/10/2019 2020   LEUKOCYTESUR LARGE (A) 09/10/2019 2020   Sepsis Labs: @LABRCNTIP (procalcitonin:4,lacticidven:4)  ) Recent Results (from the past 240 hour(s))  Blood Culture (routine x 2)     Status: None (Preliminary result)   Collection Time: 09/10/19  8:20 PM   Specimen: BLOOD  Result Value Ref Range Status  Specimen Description BLOOD BLOOD RIGHT FOREARM  Final   Special Requests   Final    BOTTLES DRAWN AEROBIC AND ANAEROBIC Blood Culture results may not be optimal due to an inadequate volume of blood received in culture bottles Performed at [facility], [place] [address] [place], [place] [address]    Culture   Final    NO GROWTH 1 DAY Performed at [facility], [place] [address], [place], [place] [address]    Report Status PENDING  Incomplete  Urine culture     Status: Abnormal   Collection Time: 09/10/19  8:20 PM   Specimen: Urine, Clean Catch  Result Value Ref Range Status   Specimen Description   Final    URINE, CLEAN CATCH Performed at [facility], [place] [address] [place], [place] [address]    Special Requests   Final    NONE Performed at  [facility], [place] [address] [place], [place] [address]    Culture (A)  Final    <10,000 COLONIES/mL INSIGNIFICANT GROWTH Performed at [place] [address] [place],  [address]    Report Status 09/12/2019 FINAL  Final  Respiratory Panel by RT PCR (Flu A&B, Covid) - Nasopharyngeal Swab     Status: None   Collection Time: 09/10/19 10:18 PM   Specimen: Nasopharyngeal Swab  Result Value Ref Range Status   SARS Coronavirus 2 by RT PCR NEGATIVE NEGATIVE Final    Comment: (NOTE) SARS-CoV-2 target nucleic acids are NOT DETECTED. The SARS-CoV-2 RNA is generally detectable in upper respiratoy specimens during the acute phase of infection. The lowest concentration of SARS-CoV-2 viral copies this assay can detect is 131 copies/mL. A negative result does not preclude SARS-Cov-2 infection and should not be used as the sole basis for treatment or other patient management decisions. A negative result may occur with  improper specimen collection/handling, submission of specimen other than nasopharyngeal swab, presence of viral mutation(s) within the areas targeted by this assay, and inadequate number of viral copies (<131 copies/mL). A negative result must be combined with clinical observations, patient history, and epidemiological information. The expected result is Negative. Fact Sheet for Patients:  PinkCheek.be Fact Sheet for Healthcare Providers:  GravelBags.it This test is not yet ap proved or cleared by the [place] FDA and  has been authorized for detection and/or diagnosis of SARS-CoV-2 by FDA under an Emergency Use Authorization (EUA). This EUA will remain  in effect (meaning this test can be used) for the duration of the COVID-19 declaration under Section 564(b)(1) of the Act, 21 U.S.C. section 360bbb-3(b)(1), unless the authorization is terminated or revoked sooner.    Influenza A  by PCR NEGATIVE NEGATIVE Final   Influenza B by PCR NEGATIVE NEGATIVE Final    Comment: (NOTE) The Xpert Xpress SARS-CoV-2/FLU/RSV assay is intended as an aid in  the diagnosis of influenza from Nasopharyngeal swab specimens and  should not be used as a sole basis for treatment. Nasal washings and  aspirates are unacceptable for Xpert Xpress SARS-CoV-2/FLU/RSV  testing. Fact Sheet for Patients: PinkCheek.be Fact Sheet for Healthcare Providers: GravelBags.it This test is not yet approved or cleared by the [place] FDA and  has been authorized for detection and/or diagnosis of SARS-CoV-2 by  FDA under an Emergency Use Authorization (EUA). This EUA will remain  in effect (meaning this test can be used) for the duration of the  Covid-19 declaration under Section 564(b)(1) of the Act, 21  U.S.C. section 360bbb-3(b)(1), unless the authorization is  terminated or revoked. Performed at [facility], [place] [address], [place], [place] [address]   MRSA PCR Screening     Status: None   Collection Time: 09/10/19 11:17 PM   Specimen: Nasal Mucosa; Nasopharyngeal  Result Value Ref Range Status   MRSA by PCR NEGATIVE NEGATIVE Final    Comment:        The GeneXpert MRSA Assay (FDA approved for NASAL specimens only), is one component of a comprehensive MRSA colonization surveillance program. It is not intended to diagnose MRSA infection nor to guide or monitor treatment for MRSA infections. Performed at [facility], [place] [address], [place], [place] [address]       Studies: No results found.  Scheduled Meds: . apixaban  5 mg Oral BID  . azithromycin  500 mg Oral QHS  . feeding supplement (ENSURE ENLIVE)  237 mL Oral BID BM  . finasteride  5 mg Oral QHS  . LORazepam  1 mg Intravenous Once  . multivitamin with minerals  1 tablet Oral Daily  . simvastatin  10 mg Oral QHS    Continuous  Infusions: . ceFEPime (MAXIPIME) IV 2 g (09/12/19 0828)     LOS: 2 days     Alma Friendly, MD [facility]  If 7PM-7AM, please contact night-coverage [email or web address] 09/12/2019, 5:46 PM

## 2019-09-12 NOTE — Evaluation (Signed)
Occupational Therapy Evaluation Patient Details Name: Barry Swanson MRN: [id number] DOB: 18-Jun-1929 Today'Swanson Date: 09/12/2019    History of Present Illness Barry Swanson is a 86 y.o. male with medical history significant of HTN, prior stroke, CKD stage 3.  Pt with recent admission in December for DVT.  Pt admitted with PNE.  Perfusion scan negative for PE.   Clinical Impression   Pt was admitted for the above. At baseline his daughter and son in law assist with adls and he walks with a rollator. Pt fatiqued during evaluation.  He did participate, but tired out quickly.  Pt states that his participation in ADLs varies. Will follow him with the goals set below.  He want to return home; would benefit from follow up OT after discharge.     Follow Up Recommendations  Home health OT;Supervision/Assistance - 24 hour(vs snf)    Equipment Recommendations  3 in 1 bedside commode    Recommendations for Other Services       Precautions / Restrictions Precautions Precautions: Fall Restrictions Weight Bearing Restrictions: No      Mobility Bed Mobility         Supine to sit: Min assist Sit to supine: Min assist   General bed mobility comments: light min A to get to EOB sitting up; min A for legs for back to bed  Transfers   Equipment used: (bedrail)   Sit to Stand: Min guard         General transfer comment: stood and sidestepped x 1 step up HOB    Balance                                           ADL either performed or assessed with clinical judgement   ADL Overall ADL'Swanson : Needs assistance/impaired Eating/Feeding: Supervision/ safety Eating/Feeding Details (indicate cue type and reason): coughing per NT Grooming: (set up for face then fatiqued)                                 General ADL Comments: pt needed total A for all other ADLs this am due to fatique.  Set up with comb, but pt did not perform.  Assisted NT with cleaning him up and  changing sheets due to male urine pouch leaking and his incontinence x bowel     Vision         Perception     Praxis      Pertinent Vitals/Pain Pain Assessment: Faces Faces Pain Scale: Hurts little more Pain Location: knees when bending up Pain Intervention(Swanson): Limited activity within patient'Swanson tolerance;Monitored during session;Repositioned     Hand Dominance     Extremity/Trunk Assessment Upper Extremity Assessment Upper Extremity Assessment: RUE deficits/detail RUE: (3+/5 shoulder) LUE WFLs           Communication Communication Communication: HOH   Cognition Arousal/Alertness: Awake/alert Behavior During Therapy: WFL for tasks assessed/performed Overall Cognitive Status: No family/caregiver present to determine baseline cognitive functioning                                 General Comments: vague answers. appears to not feel good   General Comments  3 liters; sats in 90s    Exercises     Shoulder Instructions  Home Living Family/patient expects to be discharged to:: Private residence Living Arrangements: Children Available Help at Discharge: Family;Available 24 hours/day               Bathroom Shower/Tub: Producer, television/film/video: Handicapped height     Home Equipment: Environmental consultant - 4 wheels;Shower seat - built in          Prior Functioning/Environment      ADL'Swanson / Homemaking Assistance Needed: Family assist with ADLs            OT Problem List: Decreased strength;Decreased activity tolerance;Impaired balance (sitting and/or standing);Pain;Decreased knowledge of use of DME or AE      OT Treatment/Interventions: Self-care/ADL training;Therapeutic exercise;Energy conservation;DME and/or AE instruction;Therapeutic activities;Balance training;Patient/family education    OT Goals(Current goals can be found in the care plan section) Acute Rehab OT Goals Patient Stated Goal: return home OT Goal Formulation: With  patient Time For Goal Achievement: 09/26/19 Potential to Achieve Goals: Good ADL Goals Pt Will Transfer to Toilet: with min guard assist;ambulating;bedside commode(over high commode) Pt Will Perform Toileting - Clothing Manipulation and hygiene: with min assist;sit to/from stand Additional ADL Goal #1: pt will perform UB adls and grooming with set up Additional ADL Goal #2: Pt will tolerate 30 minutes adl/UE exercise with 4 rest breaks  OT Frequency: Min 2X/week   Barriers to D/C:            Co-evaluation              AM-PAC OT "6 Clicks" Daily Activity     Outcome Measure Help from another person eating meals?: A Little Help from another person taking care of personal grooming?: A Lot Help from another person toileting, which includes using toliet, bedpan, or urinal?: Total Help from another person bathing (including washing, rinsing, drying)?: Total Help from another person to put on and taking off regular upper body clothing?: Total Help from another person to put on and taking off regular lower body clothing?: Total 6 Click Score: 9   End of Session    Activity Tolerance: No increased pain Patient left: in bed;with call bell/phone within reach;with nursing/sitter in room  OT Visit Diagnosis: Muscle weakness (generalized) (M62.81)                Time: 8295-6213 OT Time Calculation (min): 30 min Charges:  OT General Charges $OT Visit: 1 Visit OT Evaluation $OT Eval Low Complexity: 1 Low OT Treatments $Self Care/Home Management : 8-22 mins  Barry Swanson, OTR/L Acute Rehabilitation Services 09/12/2019  Barry Swanson 09/12/2019, 9:31 AM

## 2019-09-12 NOTE — Progress Notes (Signed)
Pharmacy IV to PO conversion  This patient is receiving azithromycin by the intravenous route. Based on criteria approved by the Pharmacy and Therapeutics Committee, and the Infectious Disease Division, the antibiotic(s) is/are being converted to equivalent oral dose form(s). These criteria include:   Patient being treated for a respiratory tract infection, urinary tract infection, cellulitis, or Clostridium Difficile Associated Diarrhea  The patient is not neutropenic and does not exhibit a GI malabsorption state  The patient is eating (either orally or per tube) and/or has been taking other orally administered medications for at least 24 hours.  The patient is improving clinically (physician assessment and a 24-hour Tmax of <=100.5 F)  If you have any questions about this conversion, please contact the Pharmacy Department (ext [phone]).  Thank you.  Reuel Boom, PharmD, BCPS [phone] 09/12/2019, 8:30 AM

## 2019-09-12 NOTE — TOC Progression Note (Signed)
Transition of Care [facility]) - Progression Note    Patient Details  Name: Barry Swanson MRN: [id number] Date of Birth: 18-Jan-1929  Transition of Care [facility]) CM/SW Contact  Damiah Mcdonald, Juliann Pulse, RN Phone Number: 09/12/2019, 1:11 PM  Clinical Narrative: Active w/[facility] following HHPT/OT.      Expected Discharge Plan: [place] Barriers to Discharge: Continued Medical Work up  Expected Discharge Plan and Services Expected Discharge Plan: [place]   Discharge Planning Services: CM Consult   Living arrangements for the past 2 months: Single Family Home                                       Social Determinants of Health (SDOH) Interventions    Readmission Risk Interventions Readmission Risk Prevention Plan 09/12/2019 05/03/2019  Transportation Screening Complete Complete  PCP or Specialist Appt within 3-5 Days Complete Complete  HRI or [place] Complete Complete  Social Work Consult for [place] Planning/Counseling Complete Complete  Palliative Care Screening Not Applicable Not Applicable  Medication Review Press photographer) Complete Complete  Some recent data might be hidden

## 2019-09-12 NOTE — Progress Notes (Signed)
  Speech Language Pathology Treatment: Dysphagia  Patient Details Name: Barry Swanson MRN: [id number] DOB: 1929-06-20 Today's Date: 09/12/2019 Time: 5427-0623 SLP Time Calculation (min) (ACUTE ONLY): 45 min  Assessment / Plan / Recommendation Clinical Impression    Using diagram, explained to pt concern for probable aspiration and suspect pharyngoesophageal deficit in addition to known dysmotility.  Given pt coughed excessively to point of vomiting last night and today breakfast tray was removed (without his awareness to his dysphagia), he is likely aspirating regardless of dietary modifications. He does tolerance approx 1/2 Ensure via tsps, single ice chips x3 and water boluses x4 without excessive coughing (cough x3/26 boluses, throat clear x1 and frequent belching).  Bolus amount will decrease aspiration and increase pt comfort hopefully.  Given pt is a full code - would recommend Ensure via tsp and single tsps water and ice chips pending possible [place] meeting.  Aspiration pna and malnutrition risk present.    Yesterday pt reported chronic problems swallowing and SLP suspects his current tolerance is severely compromised given his progressive medical issues.  In this SLPs opinion, MBS to assess pharyngoesophageal function will likley not change outcomes.  Will follow up for education, compensations to maximize airway protection.  NT and RN informed.   HPI HPI: 86 yo male adm [facility] with [facility] difficulties, found to have RLL pna.  Pt with h/o esophageal dysmotility, ? Schatzki's ring s/p dilatation 06/2019.  He reports to this SLP, sensation of food lodging in left side of throat and states if he swallows on the right, it goes down better.  He does cough and bring up food/drink with secretions per his statement with direct question cue.  Pt states his problems have been going on "for years" and denies worsening.  He indicated his "throat was stretched" = pointing to left lower throat. SLP printed  his esophagram and endoscopy report reviewing that he had distal esophagus dilated at the [place].  Pt PMH + for prostate cancer, TIA, DVT.  He was recently in the hospital in December 2020.  Swallow evaluation ordered. Pt underwent BSE yesterday, NT informed SLP that pt coughed excessively and vomited with dinner and this am his tray was removed due to excessive coughing/ wet voice.  Follow up indicated for dysphagia management.      SLP Plan  Continue with current plan of care       Recommendations  Diet recommendations: Nectar-thick liquid;Thin liquid(tsps amounts Ensure, water and ice only) Medication Administration: Other (Comment)(crush with nectar) Supervision: Full supervision/cueing for compensatory strategies Compensations: Small sips/bites;Slow rate(stop intake if pt coughing - cough strongly to clear and expectorate) Postural Changes and/or Swallow Maneuvers: Upright 30-60 min after meal;Seated upright 90 degrees                Oral Care Recommendations: Oral care BID SLP Visit Diagnosis: Dysphagia, unspecified (R13.10);Dysphagia, pharyngoesophageal phase (R13.14) Plan: Continue with current plan of care       GO                Barry Swanson 09/12/2019, 11:02 AM   Barry Lime, MS [place] Office [phone]

## 2019-09-13 LAB — CBC WITH DIFFERENTIAL/PLATELET
Abs Immature Granulocytes: 0.03 10*3/uL (ref 0.00–0.07)
Basophils Absolute: 0 10*3/uL (ref 0.0–0.1)
Basophils Relative: 0 %
Eosinophils Absolute: 0.8 10*3/uL — ABNORMAL HIGH (ref 0.0–0.5)
Eosinophils Relative: 8 %
HCT: 36.3 % — ABNORMAL LOW (ref 39.0–52.0)
Hemoglobin: 10.8 g/dL — ABNORMAL LOW (ref 13.0–17.0)
Immature Granulocytes: 0 %
Lymphocytes Relative: 16 %
Lymphs Abs: 1.6 10*3/uL (ref 0.7–4.0)
MCH: 28.2 pg (ref 26.0–34.0)
MCHC: 29.8 g/dL — ABNORMAL LOW (ref 30.0–36.0)
MCV: 94.8 fL (ref 80.0–100.0)
Monocytes Absolute: 0.5 10*3/uL (ref 0.1–1.0)
Monocytes Relative: 5 %
Neutro Abs: 7.2 10*3/uL (ref 1.7–7.7)
Neutrophils Relative %: 71 %
Platelets: 256 10*3/uL (ref 150–400)
RBC: 3.83 MIL/uL — ABNORMAL LOW (ref 4.22–5.81)
RDW: 15.8 % — ABNORMAL HIGH (ref 11.5–15.5)
WBC: 10.1 10*3/uL (ref 4.0–10.5)
nRBC: 0 % (ref 0.0–0.2)

## 2019-09-13 LAB — BASIC METABOLIC PANEL
Anion gap: 8 (ref 5–15)
BUN: 21 mg/dL (ref 8–23)
CO2: 23 mmol/L (ref 22–32)
Calcium: 8.8 mg/dL — ABNORMAL LOW (ref 8.9–10.3)
Chloride: 112 mmol/L — ABNORMAL HIGH (ref 98–111)
Creatinine, Ser: 1.51 mg/dL — ABNORMAL HIGH (ref 0.61–1.24)
GFR calc Af Amer: 47 mL/min — ABNORMAL LOW (ref >60)
GFR calc non Af Amer: 41 mL/min — ABNORMAL LOW (ref >60)
Glucose, Bld: 96 mg/dL (ref 70–99)
Potassium: 4.3 mmol/L (ref 3.5–5.1)
Sodium: 143 mmol/L (ref 135–145)

## 2019-09-13 MED ORDER — TRAZODONE HCL 50 MG PO TABS
50.0000 mg | ORAL_TABLET | Freq: Every day | ORAL | Status: DC
  Administered 2019-09-13 – 2019-09-18 (×6): 50 mg via ORAL
  Filled 2019-09-13 (×7): qty 1

## 2019-09-13 MED ORDER — OLANZAPINE 10 MG IM SOLR: 5.0000 mg | Freq: Two times a day (BID) | INTRAMUSCULAR | Status: DC | PRN

## 2019-09-13 MED ORDER — SODIUM CHLORIDE 0.9 % IV SOLN
100.0000 mg | Freq: Two times a day (BID) | INTRAVENOUS | Status: DC
  Administered 2019-09-13 – 2019-09-14 (×3): 100 mg via INTRAVENOUS
  Filled 2019-09-13 (×3): qty 100

## 2019-09-13 MED ORDER — OLANZAPINE 5 MG PO TABS
5.0000 mg | ORAL_TABLET | Freq: Two times a day (BID) | ORAL | Status: DC | PRN
  Administered 2019-09-13 – 2019-09-19 (×5): 5 mg via ORAL
  Filled 2019-09-13 (×5): qty 1

## 2019-09-13 MED ORDER — SODIUM CHLORIDE 0.9 % IV BOLUS
500.0000 mL | Freq: Once | INTRAVENOUS | Status: AC
  Administered 2019-09-13: 500 mL via INTRAVENOUS

## 2019-09-13 NOTE — Progress Notes (Signed)
PROGRESS NOTE    Barry Swanson  FBP:[id number] DOB: [birth year] DOA: 09/10/2019 PCP: [name], MD   Brief Narrative:  Barry Swanson a 86 y.o.malewith medical history significant ofHTN, prior stroke, CKD stage 3. Admitted in Dec for AMS due to UTI, complicated by DVT. Discharged on eliquis which he has remained on. Pt presents to ED with 2 day history of worsening fever and SOB. Tm at home 103, no cough. No known COVID exposures.  In the ED, temp 101.4, satting in the upper 80s on room air, improved to mid 90s on 3 L via [place].  D-dimer 0.53, WBC 10,000, procalcitonin 0.14, Covid POC and PCR negative.  Chest x-ray shows right lower lobe infiltrate Patient was seen and examined at bedside.  He is alert and oriented x2 with intermittent confusion and attempting to get out of bed.  Not in acute respiratory distress.  On 3 L O2 by nasal cannula  Assessment & Plan:   Principal Problem:   Right lower lobe pneumonia Active Problems:   Essential hypertension   CKD (chronic kidney disease) stage 3, GFR 30-59 ml/min   Acute respiratory failure with hypoxia (HCC)   Fever   History of DVT (deep vein thrombosis)  Clinical problems list 1.  Sepsis/right lower lobe pneumonia/acute respiratory failure with hypoxia 2.  Essential hypertension 3.  Chronic kidney disease stage IIIb 4.  History of chronic left lower extremity DVT 5.  Benign prostatic hyperplasia 6.  History of CVA 7.  Hyperlipidemia 8.  Chronic dysphagia 9.  Chronic heart failure with preserved ejection fraction   1.  Sepsis/right lower lobe pneumonia/acute respiratory failure with hypoxia likely due to aspiration.  Patient presented with fever, tachycardia, hypoxic respiratory failure, elevated procalcitonin and lactic acid, and chest x-ray finding suggestive of right lower lobe pneumonia as a source. Patient currently on cefepime and doxycycline Continue with oxygen supplementation Continue with bronchodilators and monitor  inflammatory markers  2.  Essential hypertension.  Blood pressure currently labile Continue to monitor and initiate antihypertensive medication  3.  Chronic kidney disease stage IIIb.  Stable Continue to monitor renal function Avoid nephrotoxic agents  4.  History of chronic left lower extremity DVT.  On apixaban Continue with anticoagulation  5.  Benign prostatic hyperplasia Continue with finasteride  6.  History of CVA Continue with aspirin and simvastatin  7.  Hyperlipidemia Continue with simvastatin  8.  Chronic dysphagia.  Due to Schatzki ring and esophageal dilation on 06/29/2019.  Continue to have significant trouble swallowing. Speech and swallow evaluated and recommended nectar thick liquid, thin liquid, teaspoon amount of Ensure, water and ice. Due to concern for continued aspiration with resultant right lower lobe pneumonia.  Patient was put on n.p.o. and pending reevaluation by speech and swallow. Patient is n.p.o. for now GI recommended outpatient esophageal dilatation. Discussed with family and possibility of PEG tube placement. Family is also looking for option of palliative care.  9.  Chronic heart failure with preserved ejection fraction.  Last echocardiogram on 08/08/19 showed left ventricular ejection fraction, by visual estimation, is 55 to 60%. The left ventricle has normal function. There is mildly increased left ventricular hypertrophy.  Not in acute exacerbation Continue with aspirin, statin, beta-blocker Lasix as needed 2 g sodium diet  DVT prophylaxis: Apixaban  Code Status: DNR  Family Communication: None at bedside  Disposition Plan: Patients family would like to proceed available palliative option.  Awaiting palliative consultation and decision.  Consultants:   None  Procedures: None  Antimicrobials:  Cefepime Doxycycline   Subjective: Patient was seen and examined at bedside.  Alert and oriented x2 with intermittent confusion likely  dementia with mild cognitive impairment. Not in acute distress  Objective: Vitals:   09/12/19 1354 09/12/19 2323 09/13/19 0620 09/13/19 1408  BP:  125/68 133/73 (!) 152/84  Pulse:  69 67 (!) 57  Resp:  20 17 18   Temp: (!) 97.4 F (36.3 C) 98.5 F (36.9 C) 98.1 F (36.7 C) 97.7 F (36.5 C)  TempSrc: Oral Oral Oral Oral  SpO2:  97% 98% 97%  Weight:      Height:        Intake/Output Summary (Last 24 hours) at 09/13/2019 1751 Last data filed at 09/13/2019 1500 Gross per 24 hour  Intake 2070.92 ml  Output 350 ml  Net 1720.92 ml   Filed Weights   09/10/19 2033  Weight: 74.8 kg    Examination:  General exam: Appears confused mildly agitated and attempting to get out of bed  On 3 L O2 by nasal cannula Respiratory system: Bibasilar rhonchi worse on right. Cardiovascular system: S1 & S2 heard, RRR. No JVD, murmurs, rubs, gallops or clicks. No pedal edema. Gastrointestinal system: Abdomen is nondistended, soft and nontender. No organomegaly or masses felt. Normal bowel sounds heard. Central nervous system: Alert and oriented. No focal neurological deficits. Extremities: Symmetric 5 x 5 power. Skin: No rashes, lesions or ulcers Psychiatry: Alert and oriented x2.  Mild cognitive impairment     Data Reviewed: I have personally reviewed following labs and imaging studies  CBC: Recent Labs  Lab 09/10/19 2020 09/11/19 0447 09/12/19 0411 09/13/19 0328  WBC 10.0 24.7* 13.4* 10.1  NEUTROABS 8.3*  --   --  7.2  HGB 12.7* 12.4* 10.7* 10.8*  HCT 40.8 41.7 36.5* 36.3*  MCV 90.7 94.6 95.3 94.8  PLT 279 286 236 998   Basic Metabolic Panel: Recent Labs  Lab 09/10/19 2020 09/11/19 0447 09/13/19 0328  NA 140 141 143  K 3.9 4.2 4.3  CL 106 108 112*  CO2 23 26 23   GLUCOSE 157* 142* 96  BUN 18 19 21   CREATININE 1.75* 1.77* 1.51*  CALCIUM 9.2 8.7* 8.8*   GFR: Estimated Creatinine Clearance: 34.9 mL/min (A) (by C-G formula based on SCr of 1.51 mg/dL (H)). Liver Function  Tests: Recent Labs  Lab 09/10/19 2020 09/11/19 0447  AST 20 17  ALT 15 16  ALKPHOS 57 53  BILITOT 0.6 0.7  PROT 5.6* 5.6*  ALBUMIN 3.0* 2.8*   No results for input(s): LIPASE, AMYLASE in the last 168 hours. No results for input(s): AMMONIA in the last 168 hours. Coagulation Profile: Recent Labs  Lab 09/11/19 0013  INR 1.4*   Cardiac Enzymes: No results for input(s): CKTOTAL, CKMB, CKMBINDEX, TROPONINI in the last 168 hours. BNP (last 3 results) No results for input(s): PROBNP in the last 8760 hours. HbA1C: No results for input(s): HGBA1C in the last 72 hours. CBG: No results for input(s): GLUCAP in the last 168 hours. Lipid Profile: Recent Labs    09/10/19 2020  TRIG 111   Thyroid Function Tests: No results for input(s): TSH, T4TOTAL, FREET4, T3FREE, THYROIDAB in the last 72 hours. Anemia Panel: Recent Labs    09/10/19 2020  FERRITIN 36   Sepsis Labs: Recent Labs  Lab 09/10/19 2020 09/11/19 0013 09/11/19 1352 09/12/19 0411  PROCALCITON 0.14  --  2.77 2.03  LATICACIDVEN 2.0* 1.0  --   --     Recent Results (from the  past 240 hour(s))  Blood Culture (routine x 2)     Status: None (Preliminary result)   Collection Time: 09/10/19  8:20 PM   Specimen: BLOOD  Result Value Ref Range Status   Specimen Description BLOOD BLOOD RIGHT FOREARM  Final   Special Requests   Final    BOTTLES DRAWN AEROBIC AND ANAEROBIC Blood Culture results may not be optimal due to an inadequate volume of blood received in culture bottles Performed at [facility], [place] [address], [place], [place] [address]    Culture   Final    NO GROWTH 2 DAYS Performed at [facility] [address], [place], [place] [address]    Report Status PENDING  Incomplete  Urine culture     Status: Abnormal   Collection Time: 09/10/19  8:20 PM   Specimen: Urine, Clean Catch  Result Value Ref Range Status   Specimen Description   Final    URINE, CLEAN CATCH Performed at  [facility], [place] [address] [place], [place] [address]    Special Requests   Final    NONE Performed at [facility], [place] [address] [place], [place] [address]    Culture (A)  Final    <10,000 COLONIES/mL INSIGNIFICANT GROWTH Performed at [facility] [address], [place], [place] [address]    Report Status 09/12/2019 FINAL  Final  Respiratory Panel by RT PCR (Flu A&B, Covid) - Nasopharyngeal Swab     Status: None   Collection Time: 09/10/19 10:18 PM   Specimen: Nasopharyngeal Swab  Result Value Ref Range Status   SARS Coronavirus 2 by RT PCR NEGATIVE NEGATIVE Final    Comment: (NOTE) SARS-CoV-2 target nucleic acids are NOT DETECTED. The SARS-CoV-2 RNA is generally detectable in upper respiratoy specimens during the acute phase of infection. The lowest concentration of SARS-CoV-2 viral copies this assay can detect is 131 copies/mL. A negative result does not preclude SARS-Cov-2 infection and should not be used as the sole basis for treatment or other patient management decisions. A negative result may occur with  improper specimen collection/handling, submission of specimen other than nasopharyngeal swab, presence of viral mutation(s) within the areas targeted by this assay, and inadequate number of viral copies (<131 copies/mL). A negative result must be combined with clinical observations, patient history, and epidemiological information. The expected result is Negative. Fact Sheet for Patients:  PinkCheek.be Fact Sheet for Healthcare Providers:  GravelBags.it This test is not yet ap proved or cleared by the [place] FDA and  has been authorized for detection and/or diagnosis of SARS-CoV-2 by FDA under an Emergency Use Authorization (EUA). This EUA will remain  in effect (meaning this test can be used) for the duration of the COVID-19 declaration under Section  564(b)(1) of the Act, 21 U.S.C. section 360bbb-3(b)(1), unless the authorization is terminated or revoked sooner.    Influenza A by PCR NEGATIVE NEGATIVE Final   Influenza B by PCR NEGATIVE NEGATIVE Final    Comment: (NOTE) The Xpert Xpress SARS-CoV-2/FLU/RSV assay is intended as an aid in  the diagnosis of influenza from Nasopharyngeal swab specimens and  should not be used as a sole basis for treatment. Nasal washings and  aspirates are unacceptable for Xpert Xpress SARS-CoV-2/FLU/RSV  testing. Fact Sheet for Patients: PinkCheek.be Fact Sheet for Healthcare Providers: GravelBags.it This test is not yet approved or cleared by the [place] FDA and  has been authorized for detection and/or diagnosis of SARS-CoV-2  by  FDA under an Emergency Use Authorization (EUA). This EUA will remain  in effect (meaning this test can be used) for the duration of the  Covid-19 declaration under Section 564(b)(1) of the Act, 21  U.S.C. section 360bbb-3(b)(1), unless the authorization is  terminated or revoked. Performed at [facility], [place] [address] [place], [place] [address]   MRSA PCR Screening     Status: None   Collection Time: 09/10/19 11:17 PM   Specimen: Nasal Mucosa; Nasopharyngeal  Result Value Ref Range Status   MRSA by PCR NEGATIVE NEGATIVE Final    Comment:        The GeneXpert MRSA Assay (FDA approved for NASAL specimens only), is one component of a comprehensive MRSA colonization surveillance program. It is not intended to diagnose MRSA infection nor to guide or monitor treatment for MRSA infections. Performed at [facility], [place] [address], [place], [place] [address]          Radiology Studies: No results found.      Scheduled Meds: . apixaban  5 mg Oral BID  . feeding supplement (ENSURE ENLIVE)  237 mL Oral BID BM  . finasteride  5 mg Oral QHS  .  multivitamin with minerals  1 tablet Oral Daily  . simvastatin  10 mg Oral QHS  . traZODone  50 mg Oral QHS   Continuous Infusions: . sodium chloride 75 mL/hr at 09/13/19 0001  . ceFEPime (MAXIPIME) IV 2 g (09/13/19 0837)  . doxycycline (VIBRAMYCIN) IV 100 mg (09/13/19 0926)     LOS: 3 days    Time spent: [place], MD [facility] Pager [phone]   If 7PM-7AM, please contact night-coverage [email or web address] Password [facility] 09/13/2019, 5:51 PM

## 2019-09-13 NOTE — Progress Notes (Signed)
One on one sitter still needed for pt. Noted to be restless, and anxious. Pulled out IV early this AM, would not keep oxygen on and was attempting to climb out of bed. VSS. Still requiring 3L O2 via [place]. No SOB noted. Will cont to mx.

## 2019-09-13 NOTE — Progress Notes (Signed)
SLP Cancellation Note  Patient Details Name: Barry Swanson MRN: [id number] DOB: 02/05/1929   Cancelled treatment:       Reason Eval/Treat Not Completed: Other (comment)(note pt agitation and MD follow up with GI re: pt's dysphagia as well as palliative referral, will continue efforts for pt/family education as indicated)  Kathleen Lime, MS [facility] SLP Acute Rehab Services Office [phone]   [name] 09/13/2019, 5:54 PM

## 2019-09-13 NOTE — Progress Notes (Signed)
Palliative Note:  Consult received for goals of care discussion. Chart reviewed. I attempted to call and speak with patient's daughter/POA, Barry Swanson. Voicemail left with contact information.   Will await a return call and will also re-attempt to reach daughter at a later time/date.   Detailed note and recommendations to follow once Martin discussion completed.   Thank you for your referral and allowing Palliative Medicine team to assist in Barry Swanson's care.   Alda Lea, AGPCNP-BC Palliative Medicine Team  Phone: [phone] Pager: [phone] Amion: N. Cousar  NO CHARGE    Due to high referral volume and limited staffing, there may be a delay in seeing  patients. Please call the Palliative Medicine Team office at [phone] if recommendations are needed in the interim.

## 2019-09-13 NOTE — Care Management Important Message (Signed)
Important Message  Patient Details IM Letter given to Dessa Phi RN Case Manager to present to the Patient Name: Barry Swanson MRN: [id number] Date of Birth: 05-06-1929   Medicare Important Message Given:  Yes     Kerin Salen 09/13/2019, 9:25 AM

## 2019-09-13 NOTE — Consult Note (Signed)
   [facility] CM Inpatient Consult   09/13/2019  Yurem Viner Jan 06, 1929 [id number]   Patient chart has been reviewed for readmissions less than 30 days and for high risk score, 27%, for unplanned readmissions.  Patient assessed for community [facility] follow up needs.   Chart review reveals palliative care consult placed for discussion of goals of care. Will continue to follow for progression and disposition.  Netta Cedars, MSN, [facility] Liaison Nurse Mobile Phone [phone]  Toll free office [phone]

## 2019-09-14 DIAGNOSIS — Z7189 Other specified counseling: Secondary | ICD-10-CM

## 2019-09-14 DIAGNOSIS — Y95 Nosocomial condition: Secondary | ICD-10-CM

## 2019-09-14 DIAGNOSIS — A419 Sepsis, unspecified organism: Principal | ICD-10-CM

## 2019-09-14 DIAGNOSIS — Z66 Do not resuscitate: Secondary | ICD-10-CM

## 2019-09-14 LAB — CBC WITH DIFFERENTIAL/PLATELET
Abs Immature Granulocytes: 0.02 10*3/uL (ref 0.00–0.07)
Basophils Absolute: 0.1 10*3/uL (ref 0.0–0.1)
Basophils Relative: 1 %
Eosinophils Absolute: 0.6 10*3/uL — ABNORMAL HIGH (ref 0.0–0.5)
Eosinophils Relative: 8 %
HCT: 38.8 % — ABNORMAL LOW (ref 39.0–52.0)
Hemoglobin: 11.6 g/dL — ABNORMAL LOW (ref 13.0–17.0)
Immature Granulocytes: 0 %
Lymphocytes Relative: 17 %
Lymphs Abs: 1.5 10*3/uL (ref 0.7–4.0)
MCH: 28.4 pg (ref 26.0–34.0)
MCHC: 29.9 g/dL — ABNORMAL LOW (ref 30.0–36.0)
MCV: 94.9 fL (ref 80.0–100.0)
Monocytes Absolute: 0.5 10*3/uL (ref 0.1–1.0)
Monocytes Relative: 6 %
Neutro Abs: 5.7 10*3/uL (ref 1.7–7.7)
Neutrophils Relative %: 68 %
Platelets: 248 10*3/uL (ref 150–400)
RBC: 4.09 MIL/uL — ABNORMAL LOW (ref 4.22–5.81)
RDW: 15.6 % — ABNORMAL HIGH (ref 11.5–15.5)
WBC: 8.3 10*3/uL (ref 4.0–10.5)
nRBC: 0 % (ref 0.0–0.2)

## 2019-09-14 LAB — PHOSPHORUS: Phosphorus: 2.8 mg/dL (ref 2.5–4.6)

## 2019-09-14 LAB — COMPREHENSIVE METABOLIC PANEL
ALT: 10 U/L (ref 0–44)
AST: 7 U/L — ABNORMAL LOW (ref 15–41)
Albumin: 2.5 g/dL — ABNORMAL LOW (ref 3.5–5.0)
Alkaline Phosphatase: 49 U/L (ref 38–126)
Anion gap: 9 (ref 5–15)
BUN: 19 mg/dL (ref 8–23)
CO2: 21 mmol/L — ABNORMAL LOW (ref 22–32)
Calcium: 9.4 mg/dL (ref 8.9–10.3)
Chloride: 114 mmol/L — ABNORMAL HIGH (ref 98–111)
Creatinine, Ser: 1.5 mg/dL — ABNORMAL HIGH (ref 0.61–1.24)
GFR calc Af Amer: 47 mL/min — ABNORMAL LOW (ref >60)
GFR calc non Af Amer: 41 mL/min — ABNORMAL LOW (ref >60)
Glucose, Bld: 101 mg/dL — ABNORMAL HIGH (ref 70–99)
Potassium: 3.9 mmol/L (ref 3.5–5.1)
Sodium: 144 mmol/L (ref 135–145)
Total Bilirubin: 1.1 mg/dL (ref 0.3–1.2)
Total Protein: 5.2 g/dL — ABNORMAL LOW (ref 6.5–8.1)

## 2019-09-14 LAB — MAGNESIUM: Magnesium: 2 mg/dL (ref 1.7–2.4)

## 2019-09-14 MED ORDER — HALOPERIDOL LACTATE 5 MG/ML IJ SOLN
1.0000 mg | Freq: Four times a day (QID) | INTRAMUSCULAR | Status: AC | PRN
  Administered 2019-09-14 – 2019-09-15 (×2): 1 mg via INTRAVENOUS
  Filled 2019-09-14 (×2): qty 1

## 2019-09-14 MED ORDER — DOXYCYCLINE HYCLATE 100 MG PO TABS
100.0000 mg | ORAL_TABLET | Freq: Two times a day (BID) | ORAL | Status: DC
  Administered 2019-09-14: 100 mg via ORAL
  Filled 2019-09-14: qty 1

## 2019-09-14 MED ORDER — DEXTROSE-NACL 5-0.9 % IV SOLN: INTRAVENOUS | Status: DC

## 2019-09-14 NOTE — Progress Notes (Signed)
Physical Therapy Treatment Patient Details Name: Barry Swanson MRN: [id number] DOB: 1928-12-06 Today's Date: 09/14/2019    History of Present Illness Barry Swanson is a 86 y.o. male with medical history significant of HTN, prior stroke, CKD stage 3.  Pt with recent admission in December for DVT.  Pt admitted with PNE.  Perfusion scan negative for PE.    PT Comments    Pt more talkative today however appears frustrated with mittens.  Pt assisted OOB to recliner however requiring at least mod +2 assist for safety and due to weakness and poor balance.  Uncertain if family can provide current assist level so if not then pt may need SNF.   Follow Up Recommendations  Home health PT;Supervision/Assistance - 24 hour;SNF     Equipment Recommendations  None recommended by PT    Recommendations for Other Services       Precautions / Restrictions Precautions Precautions: Fall Precaution Comments: on 2L O2 [place]    Mobility  Bed Mobility Overal bed mobility: Needs Assistance Bed Mobility: Supine to Sit     Supine to sit: Mod assist     General bed mobility comments: assist for trunk upright and LEs onto bed  Transfers Overall transfer level: Needs assistance Equipment used: Rolling walker (2 wheeled) Transfers: Sit to/from [facility] Sit to Stand: Mod assist;+2 safety/equipment;+2 physical assistance Stand pivot transfers: Mod assist;+2 safety/equipment;+2 physical assistance       General transfer comment: performed sit stand with HHA first however pt very unsteady and requiring more support so returned to sitting to utilize RW, assist to rise, stabilize and control descent, SPO2 93% on 2L O2 [place] after transfer  Ambulation/Gait                 Stairs             Wheelchair Mobility    Modified Rankin (Stroke Patients Only)       Balance           Standing balance support: Bilateral upper extremity supported;During functional  activity Standing balance-Leahy Scale: Poor                              Cognition Arousal/Alertness: Awake/alert Behavior During Therapy: WFL for tasks assessed/performed;Agitated Overall Cognitive Status: No family/caregiver present to determine baseline cognitive functioning                                 General Comments: pt frustrated, wanting mittens off      Exercises      General Comments        Pertinent Vitals/Pain Pain Assessment: Faces Faces Pain Scale: Hurts little more Pain Location: L arm (near IV site which is wrapped and pt in mittens) Pain Descriptors / Indicators: Grimacing;Discomfort Pain Intervention(s): Monitored during session;Repositioned    Home Living                      Prior Function            PT Goals (current goals can now be found in the care plan section) Progress towards PT goals: Progressing toward goals    Frequency    Min 3X/week      PT Plan Current plan remains appropriate    Co-evaluation              AM-PAC PT "  6 Clicks" Mobility   Outcome Measure  Help needed turning from your back to your side while in a flat bed without using bedrails?: A Little Help needed moving from lying on your back to sitting on the side of a flat bed without using bedrails?: A Lot Help needed moving to and from a bed to a chair (including a wheelchair)?: A Lot Help needed standing up from a chair using your arms (e.g., wheelchair or bedside chair)?: A Lot Help needed to walk in hospital room?: A Lot Help needed climbing 3-5 steps with a railing? : Total 6 Click Score: 12    End of Session Equipment Utilized During Treatment: Oxygen;Gait belt Activity Tolerance: Patient tolerated treatment well Patient left: with call bell/phone within reach;in chair;with chair alarm set;with nursing/sitter in room   PT Visit Diagnosis: Other abnormalities of gait and mobility (R26.89);Muscle weakness  (generalized) (M62.81)     Time: 1117-3567 PT Time Calculation (min) (ACUTE ONLY): 20 min  Charges:  $Therapeutic Activity: 8-22 mins                     Arlyce Dice, DPT Acute Rehabilitation Services Office: [phone]  Trena Platt 09/14/2019, 12:43 PM

## 2019-09-14 NOTE — Consult Note (Signed)
Consultation Note Date: 09/14/2019   Patient Name: Barry Swanson  DOB: [birth year]  MRN: [id number]  Age / Sex: 86 y.o., male   PCP: [name], MD Referring Physician: Elie Confer, MD   REASON FOR CONSULTATION:Establishing goals of care  Palliative Care consult requested for goals of care discussion in this 86 y.o. male with multiple medical problems including hypertension, hyperlipidemia, hard of hearing, Hepatitis B, CKD stage IIIb, left leg DVT (Eliquis), dysphagia s/p esophageal dilation (06/2019), MBS at that time showed esophageal dysmotility with questionable distal esophageal stenosis warranting EGD and dilation and CVA. Patient presented to ED from home with 2 day history of worsening fever and shortness of breath. It was reported temperature at home was 103. Patient was previously admitted in December 2020 for AMS due to UTI complicated by DVT. Work-up showed temperature 101.4, oxygen saturations in the low 80s on room air, improved with nasal cannula 3L. Chest x-ray showed RLL infiltrate. Since admission patient has received IV antibiotics. Patient has been evaluated by SLP with recommendations for NPO at this time after initial recommendations for nectar thick.   Clinical Assessment and Goals of Care: I have reviewed medical records including lab results, imaging, Epic notes, and MAR, received report from the bedside RN, and assessed the patient. I met at the bedside with patient and also spoke with patient's daughter/POA Harlan Stains via phone to discuss diagnosis prognosis, [place], EOL wishes, disposition and options. Mr. Suleiman is sitting up in the recliner. He is awake, calm, cooperative, alert and oriented x2. He is able to follow commands and provide some information appropriately.   I introduced Palliative Medicine as specialized medical care for people living with serious illness. It focuses on providing relief from the symptoms and stress of a serious illness. The goal  is to improve quality of life for both the patient and the family. Daughter verbalizes understanding and appreciation of our involvement.   We discussed a brief life review of the patient, along with his functional and nutritional status. Patient was able to share that he retired from [employer] after working there for more than 42 years. He reports he has 2 daughters and Caren Griffins is his HCPOA. Although they never married he had a long-time companionship with the "women of my life, who I called my wife" for more than 35 years. He had 2 stepsons that he looks to as his own and he lives with one of them Ethlyn Gallery). Daughter Caren Griffins confirms that all of this information is accurate.   Prior to admission patient was ambulatory with a walker and standby assist. Daughter reports he was limited in mobility and did not walk long distances and would sit in his recliner most of the day. He required assistance with ADLs. She reports a significant decline in health and mental status since December. Daughter reports family had become concerned that patient was experiencing sundowning as his memory would significantly decline as the day went on and he could become somewhat anxious. Patient had a good appetite but with noticeable signs of dysphagia with constant coughing or choking to the point family states they were pureeing or liquefying majority of his foods.    We discussed His current illness and what it means in the larger context of His on-going co-morbidities. With specific discussions regarding multiple hospitalizations, dysphagia, pneumonia, and patient's overall functional and nutritional decline. Natural disease trajectory and expectations at EOL were discussed.  Caren Griffins verbalizes understanding of patient's current illness and co-morbidities.  She is tearful in discussion expressing how patient has declined over the months and her concerns with this. She reports patient's quality of life has always been  good and important to him.   We discussed at length patient's dysphagia and recommendations from SLP. Daughter educated at length regarding patient's high risk for aspiration and further complications such as recurrent pneumonia. I discussed extensively with Mrs. White options of comfort feeding with heightened awareness of aspiration and necessary precautions that may be recommended by SLP vs. Consideration for artificial feedings/PEG tube. I educated daughter on risk and benefits. She verbalized understanding also sharing she had similar discussions with attending.   I attempted to elicit values and goals of care important to the patient.    Mrs. White shares she remains hopeful for some improvement and wants to do everything for her father to allow him an opportunity to thrive and/or stabilize. She is requesting to continue with current plan of care and would like to follow up in 24 hours after receiving additional recommendations and follow-up discussion from SLP. Daughter advised SLP will follow-up and make necessary recommendations based on their assessment. She verbalized understanding and appreciation.   She reports she is unsure of her decision regarding PEG as she feels patient would not tolerate well and could potentially dislodge in the state of confusion. She would like some time to carefully think through all options and discuss with her siblings.   Daughter, Caren Griffins is patient's confirmed [place]. Caren Griffins confirms wishes for DNR/DNI.   Hospice and Palliative Care services outpatient were explained and offered. Patient and family verbalized their understanding and awareness of both palliative and hospice's goals and philosophy of care. At this time given request to continue with all treatment options outpatient palliative would be most appropriate however, given daughter request time to think on her decisions she would like to wait on making a decision on outpatient supportive care.   She  does report patient hopefully returning home with family support versus a SNF.   Questions and concerns were addressed.  The family was encouraged to call with questions or concerns.  PMT will continue to support holistically.   SOCIAL HISTORY:     reports that he quit smoking about 21 years ago. His smoking use included cigarettes. He smoked 2.00 packs per day. His smokeless tobacco use includes chew. He reports previous alcohol use. He reports that he does not use drugs.  CODE STATUS: DNR  ADVANCE DIRECTIVES: Harlan Stains (HCPOA/Daughter)   SYMPTOM MANAGEMENT: Per attending   Palliative Prophylaxis:   Aspiration, Delirium Protocol, Eye Care, Frequent Pain Assessment, Oral Care and Turn Reposition  PSYCHO-SOCIAL/SPIRITUAL:  Support System: Family  Desire for further Chaplaincy support: NO   Additional Recommendations (Limitations, Scope, Preferences):  continue to treat, DNR/DNI   PAST MEDICAL HISTORY: Past Medical History:  Diagnosis Date  . Hepatitis B   . HOH (hard of hearing)   . Hyperlipidemia   . Hypertension   . Stroke ([place])   . UTI (urinary tract infection)     PAST SURGICAL HISTORY:  Past Surgical History:  Procedure Laterality Date  . BALLOON DILATION N/A 06/29/2019   Procedure: BALLOON DILATION;  Surgeon: Wonda Horner, MD;  Location: [facility];  Service: Endoscopy;  Laterality: N/A;  . BLADDER STONE REMOVAL     blasted  . CATARACT EXTRACTION W/ INTRAOCULAR LENS  IMPLANT, BILATERAL  2004  . CYSTOSCOPY WITH LITHOLAPAXY N/A 06/01/2019   Procedure: CYSTOSCOPY WITH LITHOLAPAXY;  Surgeon: Ardis Hughs,  MD;  Location: [facility];  Service: Urology;  Laterality: N/A;  . ESOPHAGOGASTRODUODENOSCOPY (EGD) WITH PROPOFOL N/A 06/29/2019   Procedure: ESOPHAGOGASTRODUODENOSCOPY (EGD) WITH PROPOFOL WITH POSSIBLE DIL;  Surgeon: Wonda Horner, MD;  Location: [facility];  Service: Endoscopy;  Laterality: N/A;  . PROSTATE SURGERY     trimmed prostate away from  urethral tract    ALLERGIES:  has No Known Allergies.   MEDICATIONS:  Current Facility-Administered Medications  Medication Dose Route Frequency Provider Last Rate Last Admin  . apixaban (ELIQUIS) tablet 5 mg  5 mg Oral BID Polly Cobia, RPH   5 mg at 09/13/19 2219  . ceFEPIme (MAXIPIME) 2 g in sodium chloride 0.9 % 100 mL IVPB  2 g Intravenous Q12H Wofford, Drew A, RPH 200 mL/hr at 09/14/19 1038 2 g at 09/14/19 1038  . doxycycline (VIBRA-TABS) tablet 100 mg  100 mg Oral Q12H Eudelia Bunch, RPH      . feeding supplement (ENSURE ENLIVE) (ENSURE ENLIVE) liquid 237 mL  237 mL Oral BID BM Jennette Kettle M, DO   237 mL at 09/13/19 1411  . finasteride (PROSCAR) tablet 5 mg  5 mg Oral QHS Jennette Kettle M, DO   5 mg at 09/13/19 2219  . gabapentin (NEURONTIN) capsule 300 mg  300 mg Oral Daily PRN Etta Quill, DO   300 mg at 09/13/19 1829  . haloperidol lactate (HALDOL) injection 1 mg  1 mg Intravenous Q6H PRN Gardiner Barefoot, NP   1 mg at 09/14/19 0154  . multivitamin with minerals tablet 1 tablet  1 tablet Oral Daily Alma Friendly, MD   1 tablet at 09/13/19 [id number]  . OLANZapine (ZYPREXA) tablet 5 mg  5 mg Oral BID PRN Elie Confer, MD   5 mg at 09/13/19 2222  . simvastatin (ZOCOR) tablet 10 mg  10 mg Oral QHS Jennette Kettle M, DO   10 mg at 09/13/19 2219  . traZODone (DESYREL) tablet 50 mg  50 mg Oral QHS Elie Confer, MD   50 mg at 09/13/19 2219    VITAL SIGNS: BP 128/82 (BP Location: Left Arm)   Pulse 71   Temp (!) 97.4 F (36.3 C) (Axillary)   Resp 18   Ht '5\' 10"'  (1.778 m)   Wt 74.8 kg   SpO2 95%   BMI 23.68 kg/m  Filed Weights   09/10/19 2033  Weight: 74.8 kg    Estimated body mass index is 23.68 kg/m as calculated from the following:   Height as of this encounter: '5\' 10"'  (1.778 m).   Weight as of this encounter: 74.8 kg.  LABS: CBC:    Component Value Date/Time   WBC 8.3 09/14/2019 0434   HGB 11.6 (L) 09/14/2019 0434   HCT 38.8 (L)  09/14/2019 0434   PLT 248 09/14/2019 0434   Comprehensive Metabolic Panel:    Component Value Date/Time   NA 144 09/14/2019 0434   K 3.9 09/14/2019 0434   CO2 21 (L) 09/14/2019 0434   BUN 19 09/14/2019 0434   CREATININE 1.50 (H) 09/14/2019 0434   ALBUMIN 2.5 (L) 09/14/2019 0434     Review of Systems  Unable to perform ROS: Mental status change  Unless otherwise noted, a complete review of systems is negative.  Physical Exam General: NAD, chronically-ill appearing Cardiovascular: regular rate and rhythm Pulmonary: bilateral rhonchi Abdomen: soft, nontender, + bowel sounds Extremities: no edema, no joint deformities Skin: no rashes Neurological: alert and oriented x2. Follows commands  Prognosis: Unable to determine  Discharge Planning:  To Be Determined  Recommendations:  DNR/DNI-as confirmed by daughter  Continue with current plan of care per medical team  Daughter remains hopeful for some stability. Reports she wants to provide her father with the opportunity to show improvement but also maintain quality of life. She is requesting time to think over decisions regarding PEG tube placement. Also requesting updates regarding SLP follow-up and possible MBS for clarity in her decisions.   PMT will continue to support and follow.    Palliative Performance Scale: Dysphagia                 Daughter, Caren Griffins expressed understanding and was in agreement with this plan.   Thank you for allowing the Palliative Medicine Team to assist in the care of this patient.  Time In: 1100 Time Out: 1150 Time Total: 50 min.   Visit consisted of counseling and education dealing with the complex and emotionally intense issues of symptom management and palliative care in the setting of serious and potentially life-threatening illness.Greater than 50%  of this time was spent counseling and coordinating care related to the above assessment and plan.  Signed by:  Alda Lea, AGPCNP-BC Palliative Medicine Team  Phone: [phone] Fax: [phone] Pager: [phone] Amion: Bjorn Pippin

## 2019-09-14 NOTE — Progress Notes (Signed)
Pt confused throughout evening and attempting to pull out IV, telemetry, external catheter. Unable to redirect patient behavior. Pt given zyprexa at 2222 for agitation with minimal change.. continues to need 1:1 supervision with sitter. Provider made aware.

## 2019-09-14 NOTE — Progress Notes (Signed)
PHARMACIST - PHYSICIAN COMMUNICATION  CONCERNING: Antibiotic IV to Oral Route Change Policy  RECOMMENDATION: This patient is receiving doxycycline by the intravenous route.  Based on criteria approved by the Pharmacy and Therapeutics Committee, the antibiotic(s) is/are being converted to the equivalent oral dose form(s).   DESCRIPTION: These criteria include:  Patient being treated for a respiratory tract infection, urinary tract infection, cellulitis or clostridium difficile associated diarrhea if on metronidazole  The patient is not neutropenic and does not exhibit a GI malabsorption state  The patient is eating (either orally or via tube) and/or has been taking other orally administered medications for a least 24 hours  The patient is improving clinically and has a Tmax < 100.5  If you have questions about this conversion, please contact the Pharmacy Department  []   [phone] )  [facility] []   [phone] )  [facility] []   [phone] )  [facility] []   [phone] )  [facility] [x]   [phone] )  [facility]

## 2019-09-14 NOTE — Progress Notes (Signed)
PROGRESS NOTE    Barry Swanson  HFW:[id number] DOB: [birth year] DOA: 09/10/2019 PCP: [name], MD   Brief Narrative:  Barry Swanson a 86 y.o.malewith medical history significant ofHTN, prior stroke, CKD stage 3. Admitted in Dec for AMS due to UTI, complicated by DVT. Discharged on eliquis which he has remained on. Pt presents to ED with 2 day history of worsening fever and SOB. Tm at home 103, no cough. No known COVID exposures.  In the ED, temp 101.4, satting in the upper 80s on room air, improved to mid 90s on 3 L via [facility] [place].  D-dimer 0.53, WBC 10,000, procalcitonin 0.14, Covid POC and PCR negative.  Chest x-ray shows right lower lobe infiltrate Patient was seen and examined at bedside.  He is alert and oriented x2 with intermittent confusion and attempting to get out of bed.  Not in acute respiratory distress.  On 3 L O2 by nasal cannula  Assessment & Plan:   Principal Problem:   Right lower lobe pneumonia Active Problems:   Essential hypertension   CKD (chronic kidney disease) stage 3, GFR 30-59 ml/min   Acute respiratory failure with hypoxia (HCC)   Fever   History of DVT (deep vein thrombosis)  Clinical problems list 1.  Sepsis/right lower lobe pneumonia/acute respiratory failure with hypoxia 2.  Essential hypertension 3.  Chronic kidney disease stage IIIb 4.  History of chronic left lower extremity DVT 5.  Benign prostatic hyperplasia 6.  History of CVA 7.  Hyperlipidemia 8.  Chronic dysphagia 9.  Chronic heart failure with preserved ejection fraction   1.  Sepsis/right lower lobe pneumonia/acute respiratory failure with hypoxia likely due to aspiration.  Patient presented with fever, tachycardia, hypoxic respiratory failure, elevated procalcitonin and lactic acid, and chest x-ray finding suggestive of right lower lobe pneumonia as a source. Patient currently on cefepime and doxycycline Continue with oxygen supplementation Continue with bronchodilators and monitor  inflammatory markers  2.  Essential hypertension.  Blood pressure currently labile Continue to monitor and initiate antihypertensive medication  3.  Chronic kidney disease stage IIIb.  Stable Continue to monitor renal function Avoid nephrotoxic agents  4.  History of chronic left lower extremity DVT.  On apixaban Continue with anticoagulation  5.  Benign prostatic hyperplasia Continue with finasteride  6.  History of CVA Continue with aspirin and simvastatin  7.  Hyperlipidemia Continue with simvastatin  8.  Chronic dysphagia.  Due to Schatzki ring and esophageal dilation on 06/29/2019.  Continue to have significant trouble swallowing. Speech and swallow evaluated and recommended nectar thick liquid, thin liquid, teaspoon amount of Ensure, water and ice. Due to concern for continued aspiration with resultant right lower lobe pneumonia.  Patient was put on n.p.o. and pending reevaluation by speech and swallow. Patient is n.p.o. for now Continue with IV fluid D5/normal saline at 75 GI recommended outpatient esophageal dilatation. Family requested for modified barium swallow.  Will consult speech and swallow to evaluate for suitability. Patient may benefit from PEG tube placement  9.  Chronic heart failure with preserved ejection fraction.  Last echocardiogram on 08/08/19 showed left ventricular ejection fraction, by visual estimation, is 55 to 60%. The left ventricle has normal function. There is mildly increased left ventricular hypertrophy.  Not in acute exacerbation Continue with aspirin, statin, beta-blocker Lasix as needed 2 g sodium diet  DVT prophylaxis: Apixaban  Code Status: DNR  Family Communication: None at bedside  Disposition Plan: Patient's daughter requesting MBS to further evaluate for dysphagia. Patient may need  PEG tube placement prior to d/c home  Consultants:   None  Procedures: None  Antimicrobials:  Cefepime Doxycycline   Subjective: Patient was  seen and examined at bedside.  Alert and oriented x2 with intermittent confusion likely dementia with mild cognitive impairment. Not in acute distress  Objective: Vitals:   09/13/19 0620 09/13/19 1408 09/13/19 1959 09/14/19 0419  BP: 133/73 (!) 152/84 (!) 166/89 128/82  Pulse: 67 (!) 57 74 71  Resp: 17 18 18 18   Temp: 98.1 F (36.7 C) 97.7 F (36.5 C) 98.3 F (36.8 C) (!) 97.4 F (36.3 C)  TempSrc: Oral Oral Axillary Axillary  SpO2: 98% 97% 98% 95%  Weight:      Height:        Intake/Output Summary (Last 24 hours) at 09/14/2019 0755 Last data filed at 09/14/2019 0034 Gross per 24 hour  Intake 2270.92 ml  Output 700 ml  Net 1570.92 ml   Filed Weights   09/10/19 2033  Weight: 74.8 kg    Examination:  General exam: Appears confused but calm  On 3 L O2 by nasal cannula Respiratory system: Bibasilar rhonchi worse on right. Cardiovascular system: S1 & S2 heard, RRR. No JVD, murmurs, rubs, gallops or clicks. No pedal edema. Gastrointestinal system: Abdomen is nondistended, soft and nontender. No organomegaly or masses felt. Normal bowel sounds heard. Central nervous system: Alert and oriented. No focal neurological deficits. Extremities: Symmetric 5 x 5 power. Skin: No rashes, lesions or ulcers Psychiatry: Alert and oriented x2.  Mild cognitive impairment     Data Reviewed: I have personally reviewed following labs and imaging studies  CBC: Recent Labs  Lab 09/10/19 2020 09/11/19 0447 09/12/19 0411 09/13/19 0328 09/14/19 0434  WBC 10.0 24.7* 13.4* 10.1 8.3  NEUTROABS 8.3*  --   --  7.2 5.7  HGB 12.7* 12.4* 10.7* 10.8* 11.6*  HCT 40.8 41.7 36.5* 36.3* 38.8*  MCV 90.7 94.6 95.3 94.8 94.9  PLT 279 286 236 256 917   Basic Metabolic Panel: Recent Labs  Lab 09/10/19 2020 09/11/19 0447 09/13/19 0328 09/14/19 0434  NA 140 141 143 144  K 3.9 4.2 4.3 3.9  CL 106 108 112* 114*  CO2 23 26 23  21*  GLUCOSE 157* 142* 96 101*  BUN 18 19 21 19   CREATININE 1.75* 1.77*  1.51* 1.50*  CALCIUM 9.2 8.7* 8.8* 9.4  MG  --   --   --  2.0  PHOS  --   --   --  2.8   GFR: Estimated Creatinine Clearance: 35.1 mL/min (A) (by C-G formula based on SCr of 1.5 mg/dL (H)). Liver Function Tests: Recent Labs  Lab 09/10/19 2020 09/11/19 0447 09/14/19 0434  AST 20 17 7*  ALT 15 16 10   ALKPHOS 57 53 49  BILITOT 0.6 0.7 1.1  PROT 5.6* 5.6* 5.2*  ALBUMIN 3.0* 2.8* 2.5*   No results for input(s): LIPASE, AMYLASE in the last 168 hours. No results for input(s): AMMONIA in the last 168 hours. Coagulation Profile: Recent Labs  Lab 09/11/19 0013  INR 1.4*   Cardiac Enzymes: No results for input(s): CKTOTAL, CKMB, CKMBINDEX, TROPONINI in the last 168 hours. BNP (last 3 results) No results for input(s): PROBNP in the last 8760 hours. HbA1C: No results for input(s): HGBA1C in the last 72 hours. CBG: No results for input(s): GLUCAP in the last 168 hours. Lipid Profile: No results for input(s): CHOL, HDL, LDLCALC, TRIG, CHOLHDL, LDLDIRECT in the last 72 hours. Thyroid Function Tests: No results  for input(s): TSH, T4TOTAL, FREET4, T3FREE, THYROIDAB in the last 72 hours. Anemia Panel: No results for input(s): VITAMINB12, FOLATE, FERRITIN, TIBC, IRON, RETICCTPCT in the last 72 hours. Sepsis Labs: Recent Labs  Lab 09/10/19 2020 09/11/19 0013 09/11/19 1352 09/12/19 0411  PROCALCITON 0.14  --  2.77 2.03  LATICACIDVEN 2.0* 1.0  --   --     Recent Results (from the past 240 hour(s))  Blood Culture (routine x 2)     Status: None (Preliminary result)   Collection Time: 09/10/19  8:20 PM   Specimen: BLOOD  Result Value Ref Range Status   Specimen Description BLOOD BLOOD RIGHT FOREARM  Final   Special Requests   Final    BOTTLES DRAWN AEROBIC AND ANAEROBIC Blood Culture results may not be optimal due to an inadequate volume of blood received in culture bottles Performed at [facility], [place] [address], [place], [place] [address]    Culture   Final     NO GROWTH 2 DAYS Performed at [place] [address] [place], [place] [address]    Report Status PENDING  Incomplete  Urine culture     Status: Abnormal   Collection Time: 09/10/19  8:20 PM   Specimen: Urine, Clean Catch  Result Value Ref Range Status   Specimen Description   Final    URINE, CLEAN CATCH Performed at [facility], [place] [address] [place], [place] [address]    Special Requests   Final    NONE Performed at [facility] [place] [address], [place], [place] [address]    Culture (A)  Final    <10,000 COLONIES/mL INSIGNIFICANT GROWTH Performed at [place] [address], [place], [place] [address]    Report Status 09/12/2019 FINAL  Final  Respiratory Panel by RT PCR (Flu A&B, Covid) - Nasopharyngeal Swab     Status: None   Collection Time: 09/10/19 10:18 PM   Specimen: Nasopharyngeal Swab  Result Value Ref Range Status   SARS Coronavirus 2 by RT PCR NEGATIVE NEGATIVE Final    Comment: (NOTE) SARS-CoV-2 target nucleic acids are NOT DETECTED. The SARS-CoV-2 RNA is generally detectable in upper respiratoy specimens during the acute phase of infection. The lowest concentration of SARS-CoV-2 viral copies this assay can detect is 131 copies/mL. A negative result does not preclude SARS-Cov-2 infection and should not be used as the sole basis for treatment or other patient management decisions. A negative result may occur with  improper specimen collection/handling, submission of specimen other than nasopharyngeal swab, presence of viral mutation(s) within the areas targeted by this assay, and inadequate number of viral copies (<131 copies/mL). A negative result must be combined with clinical observations, patient history, and epidemiological information. The expected result is Negative. Fact Sheet for Patients:  PinkCheek.be Fact Sheet for Healthcare Providers:    GravelBags.it This test is not yet ap proved or cleared by the [place] FDA and  has been authorized for detection and/or diagnosis of SARS-CoV-2 by FDA under an Emergency Use Authorization (EUA). This EUA will remain  in effect (meaning this test can be used) for the duration of the COVID-19 declaration under Section 564(b)(1) of the Act, 21 U.S.C. section 360bbb-3(b)(1), unless the authorization is terminated or revoked sooner.    Influenza A by PCR NEGATIVE NEGATIVE Final   Influenza B by PCR NEGATIVE NEGATIVE Final    Comment: (NOTE) The Xpert Xpress SARS-CoV-2/FLU/RSV assay is intended as an aid in  the diagnosis of influenza from Nasopharyngeal swab specimens and  should not be used as a sole basis for treatment. Nasal washings and  aspirates are unacceptable for Xpert Xpress SARS-CoV-2/FLU/RSV  testing. Fact Sheet for Patients: PinkCheek.be Fact Sheet for Healthcare Providers: GravelBags.it This test is not yet approved or cleared by the [place] FDA and  has been authorized for detection and/or diagnosis of SARS-CoV-2 by  FDA under an Emergency Use Authorization (EUA). This EUA will remain  in effect (meaning this test can be used) for the duration of the  Covid-19 declaration under Section 564(b)(1) of the Act, 21  U.S.C. section 360bbb-3(b)(1), unless the authorization is  terminated or revoked. Performed at [facility], [place] [address] [place], [place] [address]   MRSA PCR Screening     Status: None   Collection Time: 09/10/19 11:17 PM   Specimen: Nasal Mucosa; Nasopharyngeal  Result Value Ref Range Status   MRSA by PCR NEGATIVE NEGATIVE Final    Comment:        The GeneXpert MRSA Assay (FDA approved for NASAL specimens only), is one component of a comprehensive MRSA colonization surveillance program. It is not intended to diagnose MRSA infection  nor to guide or monitor treatment for MRSA infections. Performed at [facility], [place] [address] [place], [place] [address]          Radiology Studies: No results found.      Scheduled Meds: . apixaban  5 mg Oral BID  . feeding supplement (ENSURE ENLIVE)  237 mL Oral BID BM  . finasteride  5 mg Oral QHS  . multivitamin with minerals  1 tablet Oral Daily  . simvastatin  10 mg Oral QHS  . traZODone  50 mg Oral QHS   Continuous Infusions: . ceFEPime (MAXIPIME) IV 2 g (09/13/19 2241)  . doxycycline (VIBRAMYCIN) IV 100 mg (09/13/19 2020)     LOS: 4 days    Time spent: [place], MD [facility] Pager [phone]   If 7PM-7AM, please contact night-coverage [email or web address] Password [facility] 09/14/2019, 7:55 AM

## 2019-09-15 ENCOUNTER — Inpatient Hospital Stay (HOSPITAL_COMMUNITY): Payer: Medicare Other

## 2019-09-15 LAB — CBC WITH DIFFERENTIAL/PLATELET
Abs Immature Granulocytes: 0.03 10*3/uL (ref 0.00–0.07)
Basophils Absolute: 0.1 10*3/uL (ref 0.0–0.1)
Basophils Relative: 1 %
Eosinophils Absolute: 0.3 10*3/uL (ref 0.0–0.5)
Eosinophils Relative: 3 %
HCT: 39.7 % (ref 39.0–52.0)
Hemoglobin: 11.9 g/dL — ABNORMAL LOW (ref 13.0–17.0)
Immature Granulocytes: 0 %
Lymphocytes Relative: 18 %
Lymphs Abs: 1.3 10*3/uL (ref 0.7–4.0)
MCH: 28.1 pg (ref 26.0–34.0)
MCHC: 30 g/dL (ref 30.0–36.0)
MCV: 93.9 fL (ref 80.0–100.0)
Monocytes Absolute: 0.5 10*3/uL (ref 0.1–1.0)
Monocytes Relative: 6 %
Neutro Abs: 5.3 10*3/uL (ref 1.7–7.7)
Neutrophils Relative %: 72 %
Platelets: 258 10*3/uL (ref 150–400)
RBC: 4.23 MIL/uL (ref 4.22–5.81)
RDW: 15.5 % (ref 11.5–15.5)
WBC: 7.4 10*3/uL (ref 4.0–10.5)
nRBC: 0 % (ref 0.0–0.2)

## 2019-09-15 LAB — COMPREHENSIVE METABOLIC PANEL
ALT: 11 U/L (ref 0–44)
AST: 8 U/L — ABNORMAL LOW (ref 15–41)
Albumin: 2.8 g/dL — ABNORMAL LOW (ref 3.5–5.0)
Alkaline Phosphatase: 51 U/L (ref 38–126)
Anion gap: 12 (ref 5–15)
BUN: 21 mg/dL (ref 8–23)
CO2: 21 mmol/L — ABNORMAL LOW (ref 22–32)
Calcium: 9.5 mg/dL (ref 8.9–10.3)
Chloride: 112 mmol/L — ABNORMAL HIGH (ref 98–111)
Creatinine, Ser: 1.64 mg/dL — ABNORMAL HIGH (ref 0.61–1.24)
GFR calc Af Amer: 43 mL/min — ABNORMAL LOW (ref >60)
GFR calc non Af Amer: 37 mL/min — ABNORMAL LOW (ref >60)
Glucose, Bld: 104 mg/dL — ABNORMAL HIGH (ref 70–99)
Potassium: 3.9 mmol/L (ref 3.5–5.1)
Sodium: 145 mmol/L (ref 135–145)
Total Bilirubin: 1.5 mg/dL — ABNORMAL HIGH (ref 0.3–1.2)
Total Protein: 5.7 g/dL — ABNORMAL LOW (ref 6.5–8.1)

## 2019-09-15 LAB — PHOSPHORUS: Phosphorus: 3 mg/dL (ref 2.5–4.6)

## 2019-09-15 LAB — MAGNESIUM: Magnesium: 2.1 mg/dL (ref 1.7–2.4)

## 2019-09-15 LAB — C-REACTIVE PROTEIN: CRP: 9.9 mg/dL — ABNORMAL HIGH (ref <1.0)

## 2019-09-15 MED ORDER — SODIUM CHLORIDE 0.9 % IV SOLN
100.0000 mg | Freq: Two times a day (BID) | INTRAVENOUS | Status: DC
  Administered 2019-09-15 – 2019-09-18 (×7): 100 mg via INTRAVENOUS
  Filled 2019-09-15 (×7): qty 100

## 2019-09-15 MED ORDER — ORAL CARE MOUTH RINSE
15.0000 mL | Freq: Two times a day (BID) | OROMUCOSAL | Status: DC
  Administered 2019-09-15 – 2019-09-26 (×17): 15 mL via OROMUCOSAL

## 2019-09-15 MED ORDER — CHLORHEXIDINE GLUCONATE 0.12 % MT SOLN
15.0000 mL | Freq: Two times a day (BID) | OROMUCOSAL | Status: DC
  Administered 2019-09-15 – 2019-09-26 (×20): 15 mL via OROMUCOSAL
  Filled 2019-09-15 (×20): qty 15

## 2019-09-15 MED ORDER — HALOPERIDOL LACTATE 5 MG/ML IJ SOLN
5.0000 mg | Freq: Once | INTRAMUSCULAR | Status: AC
  Administered 2019-09-15: 5 mg via INTRAMUSCULAR
  Filled 2019-09-15: qty 1

## 2019-09-15 NOTE — Progress Notes (Signed)
Pt at this time calmer after one time dose of Haldol IM. Maintain current plan of care for Pt with all safety measures maintained.

## 2019-09-15 NOTE — Progress Notes (Signed)
  Speech Language Pathology Treatment: Dysphagia  Patient Details Name: Barry Swanson MRN: [id number] DOB: [birth year] Today's Date: 09/15/2019 Time: 0750-0800 SLP Time Calculation (min) (ACUTE ONLY): 10 min  Assessment / Plan / Recommendation Clinical Impression  SLP had placed order for MBS, RN requested to xray that therapist assess pt at bedside to assure appropriate for MBS.  Pt was awake upon SLP entrance and denied pain stating he was "ok".  Voice subjectively appeared diminished in strength but audible.  Pt allowed oral care and performed swallows with verbal cues - albeit delayed-.  He is appropriate for MBS this am.  Notified xray and assisted to transfer pt to xray suite.  Full report to follow.     HPI HPI: 86 yo male adm to [facility] with respiratory difficulties, found to have RLL pna.  Pt with h/o esophageal dysmotility, ? Schatzki's ring s/p dilatation 06/2019.  He reports to this SLP, sensation of food lodging in left side of throat and states if he swallows on the right, it goes down better.  He does cough and bring up food/drink with secretions per his statement with direct question cue.  Pt states his problems have been going on "for years" and denies worsening.  He indicated his "throat was stretched" = pointing to left lower throat. SLP printed his esophagram and endoscopy report reviewing that he had distal esophagus dilated at the [facility].  Pt PMH + for prostate cancer, TIA, DVT.  He was recently in the hospital in December 2020.  Swallow evaluation ordered. Pt underwent BSE yesterday, NT informed SLP that pt coughed excessively and vomited with dinner and this am his tray was removed due to excessive coughing/ wet voice.  Follow up indicated for dysphagia management.      SLP Plan  MBS       Recommendations  Medication Administration: Other (Comment)(crush with nectar) Supervision: Full supervision/cueing for compensatory strategies Compensations: Small sips/bites;Slow rate(stop  intake if pt coughing - cough strongly to clear and expectorate) Postural Changes and/or Swallow Maneuvers: Upright 30-60 min after meal;Seated upright 90 degrees                Oral Care Recommendations: Oral care BID SLP Visit Diagnosis: Dysphagia, unspecified (R13.10);Dysphagia, pharyngoesophageal phase (R13.14) Plan: MBS       GO                [name] 09/15/2019, 9:12 AM   Kathleen Lime, MS [facility] SLP [place] Office [phone]

## 2019-09-15 NOTE — Progress Notes (Signed)
PROGRESS NOTE    Barry Swanson  HYQ:[id number] DOB: [birth year] DOA: 09/10/2019 PCP: [name], MD   Brief Narrative:  Barry Swanson a 86 y.o.malewith medical history significant ofHTN, prior stroke, CKD stage 3. Admitted in Dec for AMS due to UTI, complicated by DVT. Discharged on eliquis which he has remained on. Pt presents to ED with 2 day history of worsening fever and SOB. Tm at home 103, no cough. No known COVID exposures.  In the ED, temp 101.4, satting in the upper 80s on room air, improved to mid 90s on 3 L via [place].  D-dimer 0.53, WBC 10,000, procalcitonin 0.14, Covid POC and PCR negative.  Chest x-ray shows right lower lobe infiltrate Patient was seen and examined at bedside.  He is alert and oriented x2 with intermittent confusion and attempting to get out of bed.  Not in acute respiratory distress.  On 3 L O2 by nasal cannula  Assessment & Plan:   Principal Problem:   Right lower lobe pneumonia Active Problems:   Essential hypertension   CKD (chronic kidney disease) stage 3, GFR 30-59 ml/min   Acute respiratory failure with hypoxia (HCC)   Fever   History of DVT (deep vein thrombosis)  Clinical problems list 1.  Sepsis/right lower lobe pneumonia/acute respiratory failure with hypoxia 2.  Essential hypertension 3.  Chronic kidney disease stage IIIb 4.  History of chronic left lower extremity DVT 5.  Benign prostatic hyperplasia 6.  History of CVA 7.  Hyperlipidemia 8.  Chronic dysphagia 9.  Chronic heart failure with preserved ejection fraction   1.  Sepsis/right lower lobe pneumonia/acute respiratory failure with hypoxia likely due to aspiration.  Patient presented with fever, tachycardia, hypoxic respiratory failure, elevated procalcitonin and lactic acid, and chest x-ray finding suggestive of right lower lobe pneumonia as a source. Patient currently on cefepime and doxycycline Continue with oxygen supplementation Continue with bronchodilators and monitor  inflammatory markers  2.  Essential hypertension.  Blood pressure currently labile Continue to monitor and initiate antihypertensive medication  3.  Chronic kidney disease stage IIIb.  Stable Continue to monitor renal function Avoid nephrotoxic agents  4.  History of chronic left lower extremity DVT.  On apixaban Continue with anticoagulation  5.  Benign prostatic hyperplasia Continue with finasteride  6.  History of CVA Continue with aspirin and simvastatin  7.  Hyperlipidemia Continue with simvastatin  8.  Chronic dysphagia.  Due to Schatzki ring and esophageal dilation on 06/29/2019.  Continue to have significant trouble swallowing. Speech and swallow evaluated and recommended nectar thick liquid, thin liquid, teaspoon amount of Ensure, water and ice. Due to concern for continued aspiration with resultant right lower lobe pneumonia.  Patient was put on n.p.o. and pending reevaluation by speech and swallow. Patient is n.p.o. for now Continue with IV fluid D5/normal saline at 75 GI recommended outpatient esophageal dilatation. Family requested for modified barium swallow.  Will consult speech and swallow to evaluate for suitability. Patient may benefit from PEG tube placement Patient is status post modified barium swallow with no significant change in his dysphagia.  Speech and swallow recommended only liquid.  I discussed with patient's daughter on the need to consider PEG tube placement.  9.  Chronic heart failure with preserved ejection fraction.  Last echocardiogram on 08/08/19 showed left ventricular ejection fraction, by visual estimation, is 55 to 60%. The left ventricle has normal function. There is mildly increased left ventricular hypertrophy.  Not in acute exacerbation Continue with aspirin, statin, beta-blocker Lasix  as needed 2 g sodium diet  DVT prophylaxis: Apixaban  Code Status: DNR  Family Communication: I called and spoke with patient's daughter and discussed  at length the findings with modified barium swallow and the need for placement of PEG.  Patient's daughter will be around 3 PM.  Plan to discuss more at bedside. Disposition Plan: Patient may need PEG tube placement and training and tube feeding prior to discharge  Consultants:   None  Procedures: None  Antimicrobials:  Cefepime Doxycycline   Subjective: Patient was seen and examined at bedside.  Alert and oriented x2  Patient is coming in bed and not in acute distress. He is status post modified barium swallow and still with significant dysphagia per speech and swallow. Patient may be a candidate for PEG tube placement.  Called and discussed with patient's daughter will discuss more at bedside when she arrives.  Objective: Vitals:   09/14/19 0419 09/14/19 1300 09/14/19 2014 09/15/19 0635  BP: 128/82 (!) 168/89 (!) 158/87 (!) 157/86  Pulse: 71 78 77 76  Resp: 18 18 18 20   Temp: (!) 97.4 F (36.3 C) 98 F (36.7 C) 97.8 F (36.6 C) 98.2 F (36.8 C)  TempSrc: Axillary Axillary Axillary Oral  SpO2: 95% 94% 94% 97%  Weight:      Height:        Intake/Output Summary (Last 24 hours) at 09/15/2019 1034 Last data filed at 09/15/2019 5427 Gross per 24 hour  Intake 259.32 ml  Output 1300 ml  Net -1040.68 ml   Filed Weights   09/10/19 2033  Weight: 74.8 kg    Examination:  General exam: Appears confused but calm  On 3 L O2 by nasal cannula Respiratory system: Bibasilar rhonchi worse on right. Cardiovascular system: S1 & S2 heard, RRR. No JVD, murmurs, rubs, gallops or clicks. No pedal edema. Gastrointestinal system: Abdomen is nondistended, soft and nontender. No organomegaly or masses felt. Normal bowel sounds heard. Central nervous system: Alert and oriented. No focal neurological deficits. Extremities: Symmetric 5 x 5 power. Skin: No rashes, lesions or ulcers Psychiatry: Alert and oriented x2.  Mild cognitive impairment     Data Reviewed: I have personally reviewed  following labs and imaging studies  CBC: Recent Labs  Lab 09/10/19 2020 09/10/19 2020 09/11/19 0447 09/12/19 0411 09/13/19 0328 09/14/19 0434 09/15/19 0500  WBC 10.0   < > 24.7* 13.4* 10.1 8.3 7.4  NEUTROABS 8.3*  --   --   --  7.2 5.7 5.3  HGB 12.7*   < > 12.4* 10.7* 10.8* 11.6* 11.9*  HCT 40.8   < > 41.7 36.5* 36.3* 38.8* 39.7  MCV 90.7   < > 94.6 95.3 94.8 94.9 93.9  PLT 279   < > 286 236 256 248 258   < > = values in this interval not displayed.   Basic Metabolic Panel: Recent Labs  Lab 09/10/19 2020 09/11/19 0447 09/13/19 0328 09/14/19 0434 09/15/19 0500  NA 140 141 143 144 145  K 3.9 4.2 4.3 3.9 3.9  CL 106 108 112* 114* 112*  CO2 23 26 23  21* 21*  GLUCOSE 157* 142* 96 101* 104*  BUN 18 19 21 19 21   CREATININE 1.75* 1.77* 1.51* 1.50* 1.64*  CALCIUM 9.2 8.7* 8.8* 9.4 9.5  MG  --   --   --  2.0 2.1  PHOS  --   --   --  2.8 3.0   GFR: Estimated Creatinine Clearance: 32.1 mL/min (A) (by C-G formula  based on SCr of 1.64 mg/dL (H)). Liver Function Tests: Recent Labs  Lab 09/10/19 2020 09/11/19 0447 09/14/19 0434 09/15/19 0500  AST 20 17 7* 8*  ALT 15 16 10 11   ALKPHOS 57 53 49 51  BILITOT 0.6 0.7 1.1 1.5*  PROT 5.6* 5.6* 5.2* 5.7*  ALBUMIN 3.0* 2.8* 2.5* 2.8*   No results for input(s): LIPASE, AMYLASE in the last 168 hours. No results for input(s): AMMONIA in the last 168 hours. Coagulation Profile: Recent Labs  Lab 09/11/19 0013  INR 1.4*   Cardiac Enzymes: No results for input(s): CKTOTAL, CKMB, CKMBINDEX, TROPONINI in the last 168 hours. BNP (last 3 results) No results for input(s): PROBNP in the last 8760 hours. HbA1C: No results for input(s): HGBA1C in the last 72 hours. CBG: No results for input(s): GLUCAP in the last 168 hours. Lipid Profile: No results for input(s): CHOL, HDL, LDLCALC, TRIG, CHOLHDL, LDLDIRECT in the last 72 hours. Thyroid Function Tests: No results for input(s): TSH, T4TOTAL, FREET4, T3FREE, THYROIDAB in the last 72  hours. Anemia Panel: No results for input(s): VITAMINB12, FOLATE, FERRITIN, TIBC, IRON, RETICCTPCT in the last 72 hours. Sepsis Labs: Recent Labs  Lab 09/10/19 2020 09/11/19 0013 09/11/19 1352 09/12/19 0411  PROCALCITON 0.14  --  2.77 2.03  LATICACIDVEN 2.0* 1.0  --   --     Recent Results (from the past 240 hour(s))  Blood Culture (routine x 2)     Status: None (Preliminary result)   Collection Time: 09/10/19  8:20 PM   Specimen: BLOOD RIGHT FOREARM  Result Value Ref Range Status   Specimen Description   Final    BLOOD RIGHT FOREARM Performed at [facility], [place] [address] [place], [place] [address]    Special Requests   Final    BOTTLES DRAWN AEROBIC AND ANAEROBIC Blood Culture results may not be optimal due to an inadequate volume of blood received in culture bottles Performed at [place] [address] [place], [place] [address]    Culture   Final    NO GROWTH 4 DAYS Performed at [facility], [place] [address] [place], [place] [address]    Report Status PENDING  Incomplete  Urine culture     Status: Abnormal   Collection Time: 09/10/19  8:20 PM   Specimen: Urine, Clean Catch  Result Value Ref Range Status   Specimen Description   Final    URINE, CLEAN CATCH Performed at [facility], [place] [address], [place], [place] [address]    Special Requests   Final    NONE Performed at [facility], [place] [address] [place], [place] [address]    Culture (A)  Final    <10,000 COLONIES/mL INSIGNIFICANT GROWTH Performed at [place] [address], [place], [place] [address]    Report Status 09/12/2019 FINAL  Final  Respiratory Panel by RT PCR (Flu A&B, Covid) - Nasopharyngeal Swab     Status: None   Collection Time: 09/10/19 10:18 PM   Specimen: Nasopharyngeal Swab  Result Value Ref Range Status   SARS Coronavirus 2 by RT PCR NEGATIVE NEGATIVE Final    Comment: (NOTE) SARS-CoV-2 target  nucleic acids are NOT DETECTED. The SARS-CoV-2 RNA is generally detectable in upper respiratoy specimens during the acute phase of infection. The lowest concentration of SARS-CoV-2 viral copies this assay can detect is 131 copies/mL. A negative result does not preclude SARS-Cov-2 infection and should not be used  as the sole basis for treatment or other patient management decisions. A negative result may occur with  improper specimen collection/handling, submission of specimen other than nasopharyngeal swab, presence of viral mutation(s) within the areas targeted by this assay, and inadequate number of viral copies (<131 copies/mL). A negative result must be combined with clinical observations, patient history, and epidemiological information. The expected result is Negative. Fact Sheet for Patients:  PinkCheek.be Fact Sheet for Healthcare Providers:  GravelBags.it This test is not yet ap proved or cleared by the [place] FDA and  has been authorized for detection and/or diagnosis of SARS-CoV-2 by FDA under an Emergency Use Authorization (EUA). This EUA will remain  in effect (meaning this test can be used) for the duration of the COVID-19 declaration under Section 564(b)(1) of the Act, 21 U.S.C. section 360bbb-3(b)(1), unless the authorization is terminated or revoked sooner.    Influenza A by PCR NEGATIVE NEGATIVE Final   Influenza B by PCR NEGATIVE NEGATIVE Final    Comment: (NOTE) The Xpert Xpress SARS-CoV-2/FLU/RSV assay is intended as an aid in  the diagnosis of influenza from Nasopharyngeal swab specimens and  should not be used as a sole basis for treatment. Nasal washings and  aspirates are unacceptable for Xpert Xpress SARS-CoV-2/FLU/RSV  testing. Fact Sheet for Patients: PinkCheek.be Fact Sheet for Healthcare Providers: GravelBags.it This test is not  yet approved or cleared by the [place] FDA and  has been authorized for detection and/or diagnosis of SARS-CoV-2 by  FDA under an Emergency Use Authorization (EUA). This EUA will remain  in effect (meaning this test can be used) for the duration of the  Covid-19 declaration under Section 564(b)(1) of the Act, 21  U.S.C. section 360bbb-3(b)(1), unless the authorization is  terminated or revoked. Performed at [facility], [place] [address], [place], [place] [address]   MRSA PCR Screening     Status: None   Collection Time: 09/10/19 11:17 PM   Specimen: Nasal Mucosa; Nasopharyngeal  Result Value Ref Range Status   MRSA by PCR NEGATIVE NEGATIVE Final    Comment:        The GeneXpert MRSA Assay (FDA approved for NASAL specimens only), is one component of a comprehensive MRSA colonization surveillance program. It is not intended to diagnose MRSA infection nor to guide or monitor treatment for MRSA infections. Performed at [facility], [place] [address] [place], [place] [address]          Radiology Studies: DG Swallowing Func-Speech Pathology  Result Date: 09/15/2019 Objective Swallowing Evaluation: Type of Study: MBS-Modified Barium Swallow Study  Patient Details Name: Barry Swanson MRN: [id number] Date of Birth: 07-01-1929 Today's Date: 09/15/2019 Time: SLP Start Time (ACUTE ONLY): 0809 -SLP Stop Time (ACUTE ONLY): 0831 SLP Time Calculation (min) (ACUTE ONLY): 22 min Past Medical History: Past Medical History: Diagnosis Date . Hepatitis B  . HOH (hard of hearing)  . Hyperlipidemia  . Hypertension  . Stroke ([place])  . UTI (urinary tract infection)  Past Surgical History: Past Surgical History: Procedure Laterality Date . BALLOON DILATION N/A 06/29/2019  Procedure: BALLOON DILATION;  Surgeon: Wonda Horner, MD;  Location: [facility];  Service: Endoscopy;  Laterality: N/A; . BLADDER STONE REMOVAL    blasted . CATARACT EXTRACTION W/ INTRAOCULAR LENS   IMPLANT, BILATERAL  2004 . CYSTOSCOPY WITH LITHOLAPAXY N/A 06/01/2019  Procedure: CYSTOSCOPY WITH LITHOLAPAXY;  Surgeon: Ardis Hughs, MD;  Location: [facility];  Service: Urology;  Laterality: N/A; . ESOPHAGOGASTRODUODENOSCOPY (EGD) WITH  PROPOFOL N/A 06/29/2019  Procedure: ESOPHAGOGASTRODUODENOSCOPY (EGD) WITH PROPOFOL WITH POSSIBLE DIL;  Surgeon: Wonda Horner, MD;  Location: [facility];  Service: Endoscopy;  Laterality: N/A; . PROSTATE SURGERY    trimmed prostate away from urethral tract HPI: 86 yo male adm to [facility] with respiratory difficulties, found to have RLL pna.  Pt with h/o esophageal dysmotility, ? Schatzki's ring s/p dilatation 06/2019.  He reports to this SLP, sensation of food lodging in left side of throat and states if he swallows on the right, it goes down better.  He does cough and bring up food/drink with secretions per his statement with direct question cue.  Pt states his problems have been going on "for years" and denies worsening.  He indicated his "throat was stretched" = pointing to left lower throat. SLP printed his esophagram and endoscopy report reviewing that he had distal esophagus dilated at the [place].  Pt PMH + for prostate cancer, TIA, DVT.  He was recently in the hospital in December 2020.  Swallow evaluation ordered. Pt underwent BSE yesterday, NT informed SLP that pt coughed excessively and vomited with dinner and this am his tray was removed due to excessive coughing/ wet voice.  Follow up indicated for dysphagia management.  Subjective: pt awake in chair Assessment / Plan / Recommendation CHL IP CLINICAL IMPRESSIONS 09/15/2019 Clinical Impression Patient presents with mild oral dysphagia (? Cognitive vs neuro (old cva) based with suspected primary esophageal deficits.  Oral deficits c/b decreased oral coordination resulting in premature spillage of boluses into pharynx/larynx.  Patient aspirated into airway prior to intiiation of tsp thin barium - silently.  But also likely  aspirated later in the study (flouro was off during actual aspiration but barium was visualized in airway) due to oral residuals from sequential straw bolus of thin spilling into open airway.  Delayed cough noted - within 15 seconds - that was not protective.   Small single boluses were not aspirated or penetrated with thin.  No aspiration or penetration with nectar, pudding or moistened cracker boluses.  Upon esophageal sweep, pt with barium filled esophagus.  He consumed only single bolus of cracker and pudding as well as tsp/straw bolus of nectar and approx 3 ounces of thin barium.  Clearance of esophagus appeared impaired with all consistencies and pt's esophagus was full to just below UES at completion of study without his awareness.  Oral deficits may be compensated for but pt's aspiration risk is due to his esophageal dysmotility (as diagnosed by radiologist in 2020).  Left pt sitting upright as possible given full esophagus upon sliding him into bed.  Again would advise if he desires po with known risks, liquids (NO PUDDING OR GRITS) may be better tolerated than solids/pudding but pt is at risk across all consistencies.  Pt educated to findings using monitor during test.  Will follow up x1 to inform family of dysphagia. SLP Visit Diagnosis Dysphagia, oral phase (R13.11) Attention and concentration deficit following -- Frontal lobe and executive function deficit following -- Impact on safety and function Risk for inadequate nutrition/hydration;Severe aspiration risk   CHL IP TREATMENT RECOMMENDATION 09/15/2019 Treatment Recommendations Therapy as outlined in treatment plan below   Prognosis 09/11/2019 Prognosis for Safe Diet Advancement Fair Barriers to Reach Goals -- Barriers/Prognosis Comment -- CHL IP DIET RECOMMENDATION 09/15/2019 SLP Diet Recommendations NPO except meds;Ice chips PRN after oral care Liquid Administration via -- Medication Administration (No Data) Compensations Small sips/bites;Slow rate  Postural Changes Seated upright at 90 degrees;Remain semi-upright after  after feeds/meals (Comment)   CHL IP OTHER RECOMMENDATIONS 09/15/2019 Recommended Consults -- Oral Care Recommendations Oral care QID Other Recommendations --   CHL IP FOLLOW UP RECOMMENDATIONS 09/15/2019 Follow up Recommendations None   CHL IP FREQUENCY AND DURATION 09/15/2019 Speech Therapy Frequency (ACUTE ONLY) min 1 x/week Treatment Duration 1 week      CHL IP ORAL PHASE 09/15/2019 Oral Phase Impaired Oral - Pudding Teaspoon -- Oral - Pudding Cup -- Oral - Honey Teaspoon -- Oral - Honey Cup -- Oral - Nectar Teaspoon -- Oral - Nectar Cup Delayed oral transit;Piecemeal swallowing;Decreased bolus cohesion;Lingual/palatal residue Oral - Nectar Straw Delayed oral transit;Piecemeal swallowing;Decreased bolus cohesion;Lingual/palatal residue Oral - Thin Teaspoon Premature spillage;Lingual/palatal residue Oral - Thin Cup Decreased bolus cohesion;Premature spillage;Lingual/palatal residue Oral - Thin Straw Decreased bolus cohesion;Premature spillage;Lingual/palatal residue Oral - Puree Lingual/palatal residue;Piecemeal swallowing Oral - Mech Soft WFL Oral - Regular -- Oral - Multi-Consistency -- Oral - Pill -- Oral Phase - Comment --  CHL IP PHARYNGEAL PHASE 09/15/2019 Pharyngeal Phase Impaired Pharyngeal- Pudding Teaspoon -- Pharyngeal -- Pharyngeal- Pudding Cup -- Pharyngeal -- Pharyngeal- Honey Teaspoon -- Pharyngeal -- Pharyngeal- Honey Cup -- Pharyngeal -- Pharyngeal- Nectar Teaspoon WFL Pharyngeal Material does not enter airway Pharyngeal- Nectar Cup -- Pharyngeal -- Pharyngeal- Nectar Straw WFL;Delayed swallow initiation-vallecula Pharyngeal Material does not enter airway Pharyngeal- Thin Teaspoon Penetration/Aspiration before swallow Pharyngeal Material enters airway, passes BELOW cords without attempt by patient to eject out (silent aspiration) Pharyngeal- Thin Cup Delayed swallow initiation-vallecula Pharyngeal Material does not enter airway  Pharyngeal- Thin Straw Penetration/Apiration after swallow Pharyngeal Material enters airway, passes BELOW cords and not ejected out despite cough attempt by patient Pharyngeal- Puree WFL;Pharyngeal residue - valleculae Pharyngeal -- Pharyngeal- Mechanical Soft WFL Pharyngeal Material does not enter airway Pharyngeal- Regular -- Pharyngeal -- Pharyngeal- Multi-consistency -- Pharyngeal -- Pharyngeal- Pill -- Pharyngeal -- Pharyngeal Comment --  CHL IP CERVICAL ESOPHAGEAL PHASE 09/15/2019 Cervical Esophageal Phase Impaired Pudding Teaspoon -- Pudding Cup -- Honey Teaspoon -- Honey Cup -- Nectar Teaspoon -- Nectar Cup -- Nectar Straw -- Thin Teaspoon -- Thin Cup -- Thin Straw -- Puree -- Mechanical Soft -- Regular -- Multi-consistency -- Pill -- Cervical Esophageal Comment appearance of prominent cricopharyngeus, did not impair barium flow [name] 09/15/2019, 9:46 AM                   Scheduled Meds: . apixaban  5 mg Oral BID  . chlorhexidine  15 mL Mouth Rinse BID  . feeding supplement (ENSURE ENLIVE)  237 mL Oral BID BM  . finasteride  5 mg Oral QHS  . mouth rinse  15 mL Mouth Rinse q12n4p  . multivitamin with minerals  1 tablet Oral Daily  . simvastatin  10 mg Oral QHS  . traZODone  50 mg Oral QHS   Continuous Infusions: . ceFEPime (MAXIPIME) IV 2 g (09/15/19 0900)  . dextrose 5 % and 0.9% NaCl Stopped (09/14/19 2201)  . doxycycline (VIBRAMYCIN) IV 100 mg (09/15/19 0940)     LOS: 5 days    Time spent: [place], MD [facility] Pager [phone]   If 7PM-7AM, please contact night-coverage [email or web address] Password [facility] 09/15/2019, 10:34 AM

## 2019-09-15 NOTE — Progress Notes (Signed)
Modified Barium Swallow Progress Note  Patient Details  Name: Barry Swanson MRN: [id number] Date of Birth: 27-Nov-1928  Today's Date: 09/15/2019  Modified Barium Swallow completed.  Full report located under Chart Review in the Imaging Section.  Brief recommendations include the following:  Clinical Impression  Patient presents with mild oral dysphagia (? Cognitive vs neuro (old cva) based with suspected primary esophageal deficits.  Oral deficits c/b decreased oral coordination resulting in premature spillage of boluses into pharynx/larynx.  Patient aspirated into airway prior to intiiation of tsp thin barium - silently.  But also likely aspirated later in the study (flouro was off during actual aspiration but barium was visualized in airway) due to oral residuals from sequential straw bolus of thin spilling into open airway.  Delayed cough noted - within 15 seconds - that was not protective.   Small single boluses were not aspirated or penetrated with thin.  No aspiration or penetration with nectar, pudding or moistened cracker boluses.    Upon esophageal sweep, pt with barium filled esophagus.  He consumed only single bolus of cracker and pudding as well as tsp/straw bolus of nectar and approx 3 ounces of thin barium.  Clearance of esophagus appeared impaired with all consistencies and pt's esophagus was full to just below UES at completion of study without his awareness.  Oral deficits may be compensated for but pt's aspiration risk is due to his esophageal dysmotility (as diagnosed by radiologist in 2020).  Left pt sitting upright as possible given full esophagus upon sliding him into bed.   SLP can not diagnose esophageal deficits and this area is his primary area of dysphagia, aspiration risk.   Again would advise if he desires po with known risks, liquids (NO PUDDING OR GRITS) may be better tolerated than solids/pudding but pt is at risk across all consistencies.  Pt educated to findings using  monitor during test.  Will follow up x1 to inform family of dysphagia.   Swallow Evaluation Recommendations       SLP Diet Recommendations: NPO except meds;Ice chips PRN after oral care(crush medicine with Ensure/nectar, pending palliative decision)       Medication Administration: (? crush with Ensure?)   Supervision: Patient able to self feed   Compensations: Small sips/bites;Slow rate(stop intake if pt coughing - cough strongly to clear and expectorate)   Postural Changes: Seated upright at 90 degrees;Remain semi-upright after after feeds/meals (Comment)(after po medication)   Oral Care Recommendations: Oral care QID       Kathleen Lime, MS [facility] SLP Acute Rehab Services Office [phone]  [name] 09/15/2019,9:47 AM

## 2019-09-15 NOTE — Progress Notes (Signed)
Daily Progress Note   Patient Name: Barry Swanson       Date: 09/15/2019 DOB: 03-09-1929  Age: 86 y.o. MRN#: [id number] Attending Physician: Elie Confer, MD Primary Care Physician: [name], MD Admit Date: 09/10/2019  Reason for Consultation/Follow-up: Establishing goals of care  Subjective: Patient in bed. Sleeping but easily aroused with verbal stimuli. He is alert and oriented x2.Could not appropriately identify location.  Able to answer most questions appropriately. Denies pain or shortness of breath. States "I am tired today!" Does appear more sleepy today compared to yesterday when sitting up in the chair. He is able to discuss family and identify living arrangements similar to yesterday. Continues to speak on his dead significant other and her recent death. SLP performed MBS today with recommendations of NPO due to high risk of aspiration and esophageal dysmotility.   I spoke with daughter, Caren Griffins via phone. She verbalized understanding of SLP evaluation after speaking with attending today. We discussed her options as requested. Discussed in details options of PEG versus comfort feedings under a more comfort/hospice approach. Daughter expressed she is torn with making a decision although she knows one is needed. She reports she understands the risk and benefits and is leaning towards PEG placement, however she is hopeful that patient would be in a state where she could at least talk with patient and see if he provides her with guidance in her decisions. She shares she understands he is not competent to provide the medical team with his decision but it would give her comfort in knowing if he totally objects. Support given. I offered to go into the room and call daughter on video chat,  however she expressed she plans to come up later today (around 3pm) and would prefer to discuss then. She plans to make a decision after her visit.   All questions answered. Support given.   Length of Stay: 5  Current Medications: Scheduled Meds:  . apixaban  5 mg Oral BID  . chlorhexidine  15 mL Mouth Rinse BID  . feeding supplement (ENSURE ENLIVE)  237 mL Oral BID BM  . finasteride  5 mg Oral QHS  . mouth rinse  15 mL Mouth Rinse q12n4p  . multivitamin with minerals  1 tablet Oral Daily  . simvastatin  10 mg Oral QHS  .  traZODone  50 mg Oral QHS    Continuous Infusions: . ceFEPime (MAXIPIME) IV 2 g (09/15/19 0900)  . dextrose 5 % and 0.9% NaCl Stopped (09/14/19 2201)  . doxycycline (VIBRAMYCIN) IV 100 mg (09/15/19 0940)    PRN Meds: gabapentin, OLANZapine  Physical Exam         -awake, A&O x2, chronically -ill appearing -RRR -bilateral rhonchi -will follow commands. Impaired cognition.   Vital Signs: BP (!) 142/76 (BP Location: Left Arm)   Pulse 79   Temp 97.6 F (36.4 C) (Oral)   Resp 20   Ht 5\' 10"  (1.778 m)   Wt 74.8 kg   SpO2 100%   BMI 23.68 kg/m  SpO2: SpO2: 100 % O2 Device: O2 Device: Nasal Cannula O2 Flow Rate: O2 Flow Rate (L/min): 2 L/min  Intake/output summary:   Intake/Output Summary (Last 24 hours) at 09/15/2019 1446 Last data filed at 09/15/2019 4580 Gross per 24 hour  Intake 259.32 ml  Output 1300 ml  Net -1040.68 ml   LBM: Last BM Date: 09/10/19 Baseline Weight: Weight: 74.8 kg Most recent weight: Weight: 74.8 kg       Palliative Assessment/Data: NPO       Patient Active Problem List   Diagnosis Date Noted  . Right lower lobe pneumonia 09/10/2019  . Acute respiratory failure with hypoxia ([place]) 09/10/2019  . Fever 09/10/2019  . History of DVT (deep vein thrombosis) 09/10/2019  . Acute DVT (deep venous thrombosis) ([place]) 08/14/2019  . Hematuria 08/14/2019  . CKD (chronic kidney disease) stage 3, GFR 30-59 ml/min 08/14/2019  .  Dysphagia 08/14/2019  . AMS (altered mental status) 08/08/2019  . Bladder stone 06/01/2019  . Sepsis ([place]) 04/30/2019  . Acute pyelonephritis 04/30/2019  . Acute kidney injury ([place]) 04/30/2019  . Confusion   . Essential hypertension   . Acute encephalopathy 02/11/2015  . TIA (transient ischemic attack) 02/11/2015  . Hypertension   . Hyperlipidemia   . Calculus (=stone) 11/29/2012  . Malignant neoplasm of prostate ([place]) 11/29/2012  . Bladder outflow obstruction 11/29/2012    Palliative Care Assessment & Plan   Recommendations/Plan:  Daughter plans to come to bedside and mention options of PEG to patient with hopes of some response. Expresses hopes he will give her direction in her decision as she does not want to do something he would not want. She is leaning towards PEG otherwise to allow him every opportunity to continue to thrive.   Outpatient Palliative support at discharge.   PMT will continue to support and follow as needed. I will be off service over the weekend. Please call Palliative phone line for urgent needs [phone].   Goals of Care and Additional Recommendations:  Limitations on Scope of Treatment: Full Scope Treatment  Code Status:    Code Status Orders  (From admission, onward)         Start     Ordered   09/12/19 1814  Do not attempt resuscitation (DNR)  Continuous    Question Answer Comment  In the event of cardiac or respiratory ARREST Do not call a "code blue"   In the event of cardiac or respiratory ARREST Do not perform Intubation, CPR, defibrillation or ACLS   In the event of cardiac or respiratory ARREST Use medication by any route, position, wound care, and other measures to relive pain and suffering. May use oxygen, suction and manual treatment of airway obstruction as needed for comfort.      09/12/19 1813  Code Status History    Date Active Date Inactive Code Status Order ID Comments User Context   09/10/2019 2316 09/12/2019 1813  Full Code [id number]  Etta Quill, DO ED   08/08/2019 1947 08/15/2019 1844 Full Code [id number]  Jani Gravel, MD ED   05/01/2019 0109 05/04/2019 1617 Full Code [id number]  Elwyn Reach, MD ED   02/11/2015 1850 02/13/2015 1517 Full Code [id number]  Mendel Corning, MD ED   Advance Care Planning Activity     Prognosis:   Unable to determine  Discharge Planning:  To Be Determined  Care plan was discussed with patient's daughter, Caren Griffins, Bedside RN, and Dr. Alen Blew.   Thank you for allowing the Palliative Medicine Team to assist in the care of this patient.  Time Total:45 min.   Visit consisted of counseling and education dealing with the complex and emotionally intense issues of symptom management and palliative care in the setting of serious and potentially life-threatening illness.Greater than 50%  of this time was spent counseling and coordinating care related to the above assessment and plan.  Alda Lea, AGPCNP-BC  Palliative Medicine Team [phone]   Please contact Palliative Medicine Team phone at [phone] for questions and concerns.

## 2019-09-15 NOTE — Care Management Important Message (Signed)
Important Message  Patient Details IM Letter given to Dessa Phi RN Case Manager to present to the Patient Name: Bode Pieper MRN: [id number] Date of Birth: Jan 03, 1929   Medicare Important Message Given:  Yes     Kerin Salen 09/15/2019, 12:29 PM

## 2019-09-15 NOTE — Progress Notes (Signed)
MD updated via phone Pt becoming more agitated and now trying to hit and kick at staff. Unable to reorient Pt at this time. Safety Measures are in place and Mitts on hands. New orders to be placed

## 2019-09-16 LAB — BASIC METABOLIC PANEL
Anion gap: 12 (ref 5–15)
BUN: 21 mg/dL (ref 8–23)
CO2: 22 mmol/L (ref 22–32)
Calcium: 9.4 mg/dL (ref 8.9–10.3)
Chloride: 111 mmol/L (ref 98–111)
Creatinine, Ser: 1.58 mg/dL — ABNORMAL HIGH (ref 0.61–1.24)
GFR calc Af Amer: 45 mL/min — ABNORMAL LOW (ref >60)
GFR calc non Af Amer: 38 mL/min — ABNORMAL LOW (ref >60)
Glucose, Bld: 104 mg/dL — ABNORMAL HIGH (ref 70–99)
Potassium: 3.6 mmol/L (ref 3.5–5.1)
Sodium: 145 mmol/L (ref 135–145)

## 2019-09-16 LAB — CULTURE, BLOOD (ROUTINE X 2): Culture: NO GROWTH

## 2019-09-16 NOTE — Progress Notes (Signed)
PROGRESS NOTE    Barry Swanson  SNK:[id number] DOB: 1928/10/29 DOA: 09/10/2019 PCP: [name], MD   Brief Narrative:  Barry Swanson a 86 y.o.malewith medical history significant ofHTN, prior stroke, CKD stage 3. Admitted in Dec for AMS due to UTI, complicated by DVT. Discharged on eliquis which he has remained on. Pt presents to ED with 2 day history of worsening fever and SOB. Tm at home 103, no cough. No known COVID exposures.  In the ED, temp 101.4, satting in the upper 80s on room air, improved to mid 90s on 3 L via [place].  D-dimer 0.53, WBC 10,000, procalcitonin 0.14, Covid POC and PCR negative.  Chest x-ray shows right lower lobe infiltrate Patient was seen and examined at bedside.  He is alert and oriented x2 with intermittent confusion and attempting to get out of bed.  Not in acute respiratory distress.  On 3 L O2 by nasal cannula  Assessment & Plan:   Principal Problem:   Right lower lobe pneumonia Active Problems:   Essential hypertension   CKD (chronic kidney disease) stage 3, GFR 30-59 ml/min   Acute respiratory failure with hypoxia (HCC)   Fever   History of DVT (deep vein thrombosis)  Clinical problems list 1.  Sepsis/right lower lobe pneumonia/acute respiratory failure with hypoxia 2.  Essential hypertension 3.  Chronic kidney disease stage IIIb 4.  History of chronic left lower extremity DVT 5.  Benign prostatic hyperplasia 6.  History of CVA 7.  Hyperlipidemia 8.  Chronic dysphagia 9.  Chronic heart failure with preserved ejection fraction   1.  Sepsis/right lower lobe pneumonia/acute respiratory failure with hypoxia likely due to aspiration.  Patient presented with fever, tachycardia, hypoxic respiratory failure, elevated procalcitonin and lactic acid, and chest x-ray finding suggestive of right lower lobe pneumonia as a source. Patient currently on cefepime and doxycycline Continue with oxygen supplementation Continue with bronchodilators and monitor  inflammatory markers  2.  Essential hypertension.  Blood pressure currently labile Continue to monitor and initiate antihypertensive medication  3.  Chronic kidney disease stage IIIb.  Stable Continue to monitor renal function Avoid nephrotoxic agents  4.  History of chronic left lower extremity DVT.  On apixaban Continue with anticoagulation  5.  Benign prostatic hyperplasia Continue with finasteride  6.  History of CVA Continue with aspirin and simvastatin  7.  Hyperlipidemia Continue with simvastatin  8.  Chronic dysphagia.  Due to Schatzki ring and esophageal dilation on 06/29/2019.  Continue to have significant trouble swallowing. Speech and swallow evaluated and recommended nectar thick liquid, thin liquid, teaspoon amount of Ensure, water and ice. Due to concern for continued aspiration with resultant right lower lobe pneumonia.  Patient was put on n.p.o. and pending reevaluation by speech and swallow. Patient is n.p.o. for now Continue with IV fluid D5/normal saline at 75 GI recommended outpatient esophageal dilatation. Family requested for modified barium swallow.  Will consult speech and swallow to evaluate for suitability. Patient may benefit from PEG tube placement Patient is status post modified barium swallow with no significant change in his dysphagia.  Speech and swallow recommended only liquid.  I discussed with patient's daughter on the need to consider PEG tube placement. Called and placed consult to IR for PEG tube placement   9.  Chronic heart failure with preserved ejection fraction.  Last echocardiogram on 08/08/19 showed left ventricular ejection fraction, by visual estimation, is 55 to 60%. The left ventricle has normal function. There is mildly increased left ventricular hypertrophy.  Not in acute exacerbation Continue with aspirin, statin, beta-blocker Lasix as needed 2 g sodium diet  DVT prophylaxis: Apixaban  Code Status: DNR  Family Communication:  Patient's daughter was at bedside and we discussed at length and agreed to initiate consult for PEG tube placement by IR. Disposition Plan: Patient will need PEG tube placed initiation of feeding prior to discharge home.    Consultants:   IR  Procedures: None  Antimicrobials:  Cefepime Doxycycline   Subjective: Patient was seen and examined at bedside.  Patient is alert but disoriented.  Not in acute distress.   Spoke at length with patient's daughter who is at bedside and we agreed on placement of PEG tube for feeding prior to discharge home.  Patient's daughter would like patient to be discharged home.  Objective: Vitals:   09/15/19 1940 09/16/19 0525 09/16/19 0752 09/16/19 1321  BP: (!) 167/88 128/75 (!) 151/90 (!) 136/96  Pulse: 75 70 77 75  Resp: 20 20 20  (!) 21  Temp: 97.8 F (36.6 C) 97.8 F (36.6 C) 98.1 F (36.7 C) 97.8 F (36.6 C)  TempSrc: Oral Oral Oral Oral  SpO2: 94% 93% 90% 94%  Weight:      Height:        Intake/Output Summary (Last 24 hours) at 09/16/2019 1447 Last data filed at 09/16/2019 1341 Gross per 24 hour  Intake 818.69 ml  Output 1400 ml  Net -581.31 ml   Filed Weights   09/10/19 2033  Weight: 74.8 kg    Examination:  General exam: Appears confused but calm  On 3 L O2 by nasal cannula Respiratory system: Bibasilar rhonchi worse on right. Cardiovascular system: S1 & S2 heard, RRR. No JVD, murmurs, rubs, gallops or clicks. No pedal edema. Gastrointestinal system: Abdomen is nondistended, soft and nontender. No organomegaly or masses felt. Normal bowel sounds heard. Central nervous system: Alert and oriented. No focal neurological deficits. Extremities: Symmetric 5 x 5 power. Skin: No rashes, lesions or ulcers Psychiatry: Alert and oriented x2.  Mild cognitive impairment     Data Reviewed: I have personally reviewed following labs and imaging studies  CBC: Recent Labs  Lab 09/10/19 2020 09/10/19 2020 09/11/19 0447 09/12/19 0411  09/13/19 0328 09/14/19 0434 09/15/19 0500  WBC 10.0   < > 24.7* 13.4* 10.1 8.3 7.4  NEUTROABS 8.3*  --   --   --  7.2 5.7 5.3  HGB 12.7*   < > 12.4* 10.7* 10.8* 11.6* 11.9*  HCT 40.8   < > 41.7 36.5* 36.3* 38.8* 39.7  MCV 90.7   < > 94.6 95.3 94.8 94.9 93.9  PLT 279   < > 286 236 256 248 258   < > = values in this interval not displayed.   Basic Metabolic Panel: Recent Labs  Lab 09/11/19 0447 09/13/19 0328 09/14/19 0434 09/15/19 0500 09/16/19 0430  NA 141 143 144 145 145  K 4.2 4.3 3.9 3.9 3.6  CL 108 112* 114* 112* 111  CO2 26 23 21* 21* 22  GLUCOSE 142* 96 101* 104* 104*  BUN 19 21 19 21 21   CREATININE 1.77* 1.51* 1.50* 1.64* 1.58*  CALCIUM 8.7* 8.8* 9.4 9.5 9.4  MG  --   --  2.0 2.1  --   PHOS  --   --  2.8 3.0  --    GFR: Estimated Creatinine Clearance: 33.4 mL/min (A) (by C-G formula based on SCr of 1.58 mg/dL (H)). Liver Function Tests: Recent Labs  Lab 09/10/19 2020  09/11/19 0447 09/14/19 0434 09/15/19 0500  AST 20 17 7* 8*  ALT 15 16 10 11   ALKPHOS 57 53 49 51  BILITOT 0.6 0.7 1.1 1.5*  PROT 5.6* 5.6* 5.2* 5.7*  ALBUMIN 3.0* 2.8* 2.5* 2.8*   No results for input(s): LIPASE, AMYLASE in the last 168 hours. No results for input(s): AMMONIA in the last 168 hours. Coagulation Profile: Recent Labs  Lab 09/11/19 0013  INR 1.4*   Cardiac Enzymes: No results for input(s): CKTOTAL, CKMB, CKMBINDEX, TROPONINI in the last 168 hours. BNP (last 3 results) No results for input(s): PROBNP in the last 8760 hours. HbA1C: No results for input(s): HGBA1C in the last 72 hours. CBG: No results for input(s): GLUCAP in the last 168 hours. Lipid Profile: No results for input(s): CHOL, HDL, LDLCALC, TRIG, CHOLHDL, LDLDIRECT in the last 72 hours. Thyroid Function Tests: No results for input(s): TSH, T4TOTAL, FREET4, T3FREE, THYROIDAB in the last 72 hours. Anemia Panel: No results for input(s): VITAMINB12, FOLATE, FERRITIN, TIBC, IRON, RETICCTPCT in the last 72  hours. Sepsis Labs: Recent Labs  Lab 09/10/19 2020 09/11/19 0013 09/11/19 1352 09/12/19 0411  PROCALCITON 0.14  --  2.77 2.03  LATICACIDVEN 2.0* 1.0  --   --     Recent Results (from the past 240 hour(s))  Blood Culture (routine x 2)     Status: None   Collection Time: 09/10/19  8:20 PM   Specimen: BLOOD RIGHT FOREARM  Result Value Ref Range Status   Specimen Description   Final    BLOOD RIGHT FOREARM Performed at [facility], [place] [address] [place], [place] [address]    Special Requests   Final    BOTTLES DRAWN AEROBIC AND ANAEROBIC Blood Culture results may not be optimal due to an inadequate volume of blood received in culture bottles Performed at [place] [address], [place], [place] [address]    Culture   Final    NO GROWTH 5 DAYS Performed at [facility], [place] [address] [place], [place] [address]    Report Status 09/16/2019 FINAL  Final  Urine culture     Status: Abnormal   Collection Time: 09/10/19  8:20 PM   Specimen: Urine, Clean Catch  Result Value Ref Range Status   Specimen Description   Final    URINE, CLEAN CATCH Performed at [facility], [place] [address] [place], [place] [address]    Special Requests   Final    NONE Performed at [facility], [place] [address] [place], [place] [address]    Culture (A)  Final    <10,000 COLONIES/mL INSIGNIFICANT GROWTH Performed at [facility] [address], [place], [place] [address]    Report Status 09/12/2019 FINAL  Final  Respiratory Panel by RT PCR (Flu A&B, Covid) - Nasopharyngeal Swab     Status: None   Collection Time: 09/10/19 10:18 PM   Specimen: Nasopharyngeal Swab  Result Value Ref Range Status   SARS Coronavirus 2 by RT PCR NEGATIVE NEGATIVE Final    Comment: (NOTE) SARS-CoV-2 target nucleic acids are NOT DETECTED. The SARS-CoV-2 RNA is generally detectable in upper respiratoy specimens during the acute phase of  infection. The lowest concentration of SARS-CoV-2 viral copies this assay can detect is 131 copies/mL. A negative result does not preclude SARS-Cov-2 infection and should not be used as the sole basis for treatment or other patient management decisions. A negative result may occur with  improper specimen collection/handling, submission of specimen other than nasopharyngeal swab, presence of viral mutation(s) within the areas targeted by this assay, and inadequate number of viral copies (<131 copies/mL). A negative result must be combined with clinical observations, patient history, and epidemiological information. The expected result is Negative. Fact Sheet for Patients:  PinkCheek.be Fact Sheet for Healthcare Providers:  GravelBags.it This test is not yet ap proved or cleared by the [place] FDA and  has been authorized for detection and/or diagnosis of SARS-CoV-2 by FDA under an Emergency Use Authorization (EUA). This EUA will remain  in effect (meaning this test can be used) for the duration of the COVID-19 declaration under Section 564(b)(1) of the Act, 21 U.S.C. section 360bbb-3(b)(1), unless the authorization is terminated or revoked sooner.    Influenza A by PCR NEGATIVE NEGATIVE Final   Influenza B by PCR NEGATIVE NEGATIVE Final    Comment: (NOTE) The Xpert Xpress SARS-CoV-2/FLU/RSV assay is intended as an aid in  the diagnosis of influenza from Nasopharyngeal swab specimens and  should not be used as a sole basis for treatment. Nasal washings and  aspirates are unacceptable for Xpert Xpress SARS-CoV-2/FLU/RSV  testing. Fact Sheet for Patients: PinkCheek.be Fact Sheet for Healthcare Providers: GravelBags.it This test is not yet approved or cleared by the [place] FDA and  has been authorized for detection and/or diagnosis of SARS-CoV-2 by  FDA under  an Emergency Use Authorization (EUA). This EUA will remain  in effect (meaning this test can be used) for the duration of the  Covid-19 declaration under Section 564(b)(1) of the Act, 21  U.S.C. section 360bbb-3(b)(1), unless the authorization is  terminated or revoked. Performed at [facility], [place] [address] [place], [place] [address]   MRSA PCR Screening     Status: None   Collection Time: 09/10/19 11:17 PM   Specimen: Nasal Mucosa; Nasopharyngeal  Result Value Ref Range Status   MRSA by PCR NEGATIVE NEGATIVE Final    Comment:        The GeneXpert MRSA Assay (FDA approved for NASAL specimens only), is one component of a comprehensive MRSA colonization surveillance program. It is not intended to diagnose MRSA infection nor to guide or monitor treatment for MRSA infections. Performed at [facility], [place] [address], [place], [place] [address]          Radiology Studies: DG Swallowing Func-Speech Pathology  Result Date: 09/15/2019 Objective Swallowing Evaluation: Type of Study: MBS-Modified Barium Swallow Study  Patient Details Name: Barry Swanson MRN: [id number] Date of Birth: 07/07/1929 Today's Date: 09/15/2019 Time: SLP Start Time (ACUTE ONLY): 0809 -SLP Stop Time (ACUTE ONLY): 0831 SLP Time Calculation (min) (ACUTE ONLY): 22 min Past Medical History: Past Medical History: Diagnosis Date . Hepatitis B  . HOH (hard of hearing)  . Hyperlipidemia  . Hypertension  . Stroke ([place])  . UTI (urinary tract infection)  Past Surgical History: Past Surgical History: Procedure Laterality Date . BALLOON DILATION N/A 06/29/2019  Procedure: BALLOON DILATION;  Surgeon: Wonda Horner, MD;  Location: [facility];  Service: Endoscopy;  Laterality: N/A; . BLADDER STONE REMOVAL    blasted . CATARACT EXTRACTION W/ INTRAOCULAR LENS  IMPLANT, BILATERAL  2004 . CYSTOSCOPY WITH LITHOLAPAXY N/A 06/01/2019  Procedure: CYSTOSCOPY WITH LITHOLAPAXY;  Surgeon: Ardis Hughs, MD;  Location: [facility];  Service: Urology;  Laterality: N/A; . ESOPHAGOGASTRODUODENOSCOPY (EGD) WITH PROPOFOL N/A 06/29/2019  Procedure: ESOPHAGOGASTRODUODENOSCOPY (EGD) WITH PROPOFOL WITH POSSIBLE DIL;  Surgeon: Wonda Horner, MD;  Location: [facility];  Service: Endoscopy;  Laterality: N/A; . PROSTATE SURGERY    trimmed prostate away from urethral tract HPI: 86 yo male adm to [facility] with respiratory difficulties, found to have RLL pna.  Pt with h/o esophageal dysmotility, ? Schatzki's ring s/p dilatation 06/2019.  He reports to this SLP, sensation of food lodging in left side of throat and states if he swallows on the right, it goes down better.  He does cough and bring up food/drink with secretions per his statement with direct question cue.  Pt states his problems have been going on "for years" and denies worsening.  He indicated his "throat was stretched" = pointing to left lower throat. SLP printed his esophagram and endoscopy report reviewing that he had distal esophagus dilated at the [place] [place].  Pt PMH + for prostate cancer, TIA, DVT.  He was recently in the hospital in December 2020.  Swallow evaluation ordered. Pt underwent BSE yesterday, NT informed SLP that pt coughed excessively and vomited with dinner and this am his tray was removed due to excessive coughing/ wet voice.  Follow up indicated for dysphagia management.  Subjective: pt awake in chair Assessment / Plan / Recommendation CHL IP CLINICAL IMPRESSIONS 09/15/2019 Clinical Impression Patient presents with mild oral dysphagia (? Cognitive vs neuro (old cva) based with suspected primary esophageal deficits.  Oral deficits c/b decreased oral coordination resulting in premature spillage of boluses into pharynx/larynx.  Patient aspirated into airway prior to intiiation of tsp thin barium - silently.  But also likely aspirated later in the study (flouro was off during actual aspiration but barium was visualized in airway) due to oral residuals from  sequential straw bolus of thin spilling into open airway.  Delayed cough noted - within 15 seconds - that was not protective.   Small single boluses were not aspirated or penetrated with thin.  No aspiration or penetration with nectar, pudding or moistened cracker boluses.  Upon esophageal sweep, pt with barium filled esophagus.  He consumed only single bolus of cracker and pudding as well as tsp/straw bolus of nectar and approx 3 ounces of thin barium.  Clearance of esophagus appeared impaired with all consistencies and pt's esophagus was full to just below UES at completion of study without his awareness.  Oral deficits may be compensated for but pt's aspiration risk is due to his esophageal dysmotility (as diagnosed by radiologist in 2020).  Left pt sitting upright as possible given full esophagus upon sliding him into bed.  Again would advise if he desires po with known risks, liquids (NO PUDDING OR GRITS) may be better tolerated than solids/pudding but pt is at risk across all consistencies.  Pt educated to findings using monitor during test.  Will follow up x1 to inform family of dysphagia. SLP Visit Diagnosis Dysphagia, oral phase (R13.11) Attention and concentration deficit following -- Frontal lobe and executive function deficit following -- Impact on safety and function Risk for inadequate nutrition/hydration;Severe aspiration risk   CHL IP TREATMENT RECOMMENDATION 09/15/2019 Treatment Recommendations Therapy as outlined in treatment plan below   Prognosis 09/11/2019 Prognosis for Safe Diet Advancement Fair Barriers to Reach Goals -- Barriers/Prognosis Comment -- CHL IP DIET RECOMMENDATION 09/15/2019 SLP Diet Recommendations NPO except meds;Ice chips PRN after oral care Liquid Administration via -- Medication Administration (No Data) Compensations Small sips/bites;Slow rate Postural Changes Seated upright at 90 degrees;Remain semi-upright after after feeds/meals (Comment)   CHL IP OTHER RECOMMENDATIONS  09/15/2019 Recommended Consults -- Oral Care Recommendations Oral care QID  Other Recommendations --   CHL IP FOLLOW UP RECOMMENDATIONS 09/15/2019 Follow up Recommendations None   CHL IP FREQUENCY AND DURATION 09/15/2019 Speech Therapy Frequency (ACUTE ONLY) min 1 x/week Treatment Duration 1 week      CHL IP ORAL PHASE 09/15/2019 Oral Phase Impaired Oral - Pudding Teaspoon -- Oral - Pudding Cup -- Oral - Honey Teaspoon -- Oral - Honey Cup -- Oral - Nectar Teaspoon -- Oral - Nectar Cup Delayed oral transit;Piecemeal swallowing;Decreased bolus cohesion;Lingual/palatal residue Oral - Nectar Straw Delayed oral transit;Piecemeal swallowing;Decreased bolus cohesion;Lingual/palatal residue Oral - Thin Teaspoon Premature spillage;Lingual/palatal residue Oral - Thin Cup Decreased bolus cohesion;Premature spillage;Lingual/palatal residue Oral - Thin Straw Decreased bolus cohesion;Premature spillage;Lingual/palatal residue Oral - Puree Lingual/palatal residue;Piecemeal swallowing Oral - Mech Soft WFL Oral - Regular -- Oral - Multi-Consistency -- Oral - Pill -- Oral Phase - Comment --  CHL IP PHARYNGEAL PHASE 09/15/2019 Pharyngeal Phase Impaired Pharyngeal- Pudding Teaspoon -- Pharyngeal -- Pharyngeal- Pudding Cup -- Pharyngeal -- Pharyngeal- Honey Teaspoon -- Pharyngeal -- Pharyngeal- Honey Cup -- Pharyngeal -- Pharyngeal- Nectar Teaspoon WFL Pharyngeal Material does not enter airway Pharyngeal- Nectar Cup -- Pharyngeal -- Pharyngeal- Nectar Straw WFL;Delayed swallow initiation-vallecula Pharyngeal Material does not enter airway Pharyngeal- Thin Teaspoon Penetration/Aspiration before swallow Pharyngeal Material enters airway, passes BELOW cords without attempt by patient to eject out (silent aspiration) Pharyngeal- Thin Cup Delayed swallow initiation-vallecula Pharyngeal Material does not enter airway Pharyngeal- Thin Straw Penetration/Apiration after swallow Pharyngeal Material enters airway, passes BELOW cords and not ejected out  despite cough attempt by patient Pharyngeal- Puree WFL;Pharyngeal residue - valleculae Pharyngeal -- Pharyngeal- Mechanical Soft WFL Pharyngeal Material does not enter airway Pharyngeal- Regular -- Pharyngeal -- Pharyngeal- Multi-consistency -- Pharyngeal -- Pharyngeal- Pill -- Pharyngeal -- Pharyngeal Comment --  CHL IP CERVICAL ESOPHAGEAL PHASE 09/15/2019 Cervical Esophageal Phase Impaired Pudding Teaspoon -- Pudding Cup -- Honey Teaspoon -- Honey Cup -- Nectar Teaspoon -- Nectar Cup -- Nectar Straw -- Thin Teaspoon -- Thin Cup -- Thin Straw -- Puree -- Mechanical Soft -- Regular -- Multi-consistency -- Pill -- Cervical Esophageal Comment appearance of prominent cricopharyngeus, did not impair barium flow [name] 09/15/2019, 9:46 AM                   Scheduled Meds: . apixaban  5 mg Oral BID  . chlorhexidine  15 mL Mouth Rinse BID  . feeding supplement (ENSURE ENLIVE)  237 mL Oral BID BM  . finasteride  5 mg Oral QHS  . mouth rinse  15 mL Mouth Rinse q12n4p  . multivitamin with minerals  1 tablet Oral Daily  . simvastatin  10 mg Oral QHS  . traZODone  50 mg Oral QHS   Continuous Infusions: . ceFEPime (MAXIPIME) IV 2 g (09/16/19 1123)  . dextrose 5 % and 0.9% NaCl 10 mL/hr at 09/16/19 0200  . doxycycline (VIBRAMYCIN) IV 100 mg (09/16/19 0841)     LOS: 6 days    Time spent: [place], MD [facility] Pager [phone]   If 7PM-7AM, please contact night-coverage [email or web address] Password [facility] 09/16/2019, 2:47 PM

## 2019-09-16 NOTE — Progress Notes (Signed)
Resumed care of patient from [place] with her previous assessment, will continue to monitor patient.

## 2019-09-17 ENCOUNTER — Encounter (HOSPITAL_COMMUNITY): Payer: Self-pay | Admitting: Internal Medicine

## 2019-09-17 ENCOUNTER — Inpatient Hospital Stay (HOSPITAL_COMMUNITY): Payer: Medicare Other

## 2019-09-17 LAB — COMPREHENSIVE METABOLIC PANEL
ALT: 8 U/L (ref 0–44)
AST: 10 U/L — ABNORMAL LOW (ref 15–41)
Albumin: 2.5 g/dL — ABNORMAL LOW (ref 3.5–5.0)
Alkaline Phosphatase: 39 U/L (ref 38–126)
Anion gap: 10 (ref 5–15)
BUN: 22 mg/dL (ref 8–23)
CO2: 23 mmol/L (ref 22–32)
Calcium: 9.4 mg/dL (ref 8.9–10.3)
Chloride: 114 mmol/L — ABNORMAL HIGH (ref 98–111)
Creatinine, Ser: 1.55 mg/dL — ABNORMAL HIGH (ref 0.61–1.24)
GFR calc Af Amer: 46 mL/min — ABNORMAL LOW (ref >60)
GFR calc non Af Amer: 39 mL/min — ABNORMAL LOW (ref >60)
Glucose, Bld: 95 mg/dL (ref 70–99)
Potassium: 3.8 mmol/L (ref 3.5–5.1)
Sodium: 147 mmol/L — ABNORMAL HIGH (ref 135–145)
Total Bilirubin: 0.5 mg/dL (ref 0.3–1.2)
Total Protein: 5.4 g/dL — ABNORMAL LOW (ref 6.5–8.1)

## 2019-09-17 LAB — CBC
HCT: 39.5 % (ref 39.0–52.0)
Hemoglobin: 11.6 g/dL — ABNORMAL LOW (ref 13.0–17.0)
MCH: 27.9 pg (ref 26.0–34.0)
MCHC: 29.4 g/dL — ABNORMAL LOW (ref 30.0–36.0)
MCV: 95 fL (ref 80.0–100.0)
Platelets: 266 10*3/uL (ref 150–400)
RBC: 4.16 MIL/uL — ABNORMAL LOW (ref 4.22–5.81)
RDW: 15.6 % — ABNORMAL HIGH (ref 11.5–15.5)
WBC: 6.7 10*3/uL (ref 4.0–10.5)
nRBC: 0 % (ref 0.0–0.2)

## 2019-09-17 LAB — MAGNESIUM: Magnesium: 2 mg/dL (ref 1.7–2.4)

## 2019-09-17 LAB — PHOSPHORUS: Phosphorus: 2.9 mg/dL (ref 2.5–4.6)

## 2019-09-17 MED ORDER — DEXTROSE 5 % IV SOLN: INTRAVENOUS | Status: DC

## 2019-09-17 NOTE — Progress Notes (Signed)
Occupational Therapy Treatment Patient Details Name: Guilherme Chappel MRN: [id number] DOB: [birth year] Today's Date: 09/17/2019    History of present illness Dollie Steeg is a 86 y.o. male with medical history significant of HTN, prior stroke, CKD stage 3.  Pt with recent admission in December for DVT.  Pt admitted with PNE.  Perfusion scan negative for PE.   OT comments  Pt making progress with OT.  Initially when I arrived, he had taken 02 off and was 84%. Replaced and pt remained 90-94% during tx.  Pt sat EOB x 20 minutes performing grooming and UE exercises.  Initially dizzy, but this resolved after only a couple of minutes.   Follow Up Recommendations  Home health OT;Supervision/Assistance - 24 hour    Equipment Recommendations  3 in 1 bedside commode    Recommendations for Other Services      Precautions / Restrictions Precautions Precautions: Fall Precaution Comments: on 2L O2 [place]       Mobility Bed Mobility         Supine to sit: Mod assist;HOB elevated Sit to supine: Min assist   General bed mobility comments: assist for trunk to sit up and for legs for back to bed  Transfers                 General transfer comment: pt scooted up [facility] with min A    Balance                                           ADL either performed or assessed with clinical judgement   ADL       Grooming: Wash/dry face;Set up;Brushing hair;Sitting                                 General ADL Comments: sat EOB and performed grooming and UE exericses     Vision       Perception     Praxis      Cognition Arousal/Alertness: Awake/alert Behavior During Therapy: WFL for tasks assessed/performed;Agitated Overall Cognitive Status: No family/caregiver present to determine baseline cognitive functioning                                 General Comments: pt appropriate, smiling, talkative        Exercises Exercises: Other  exercises Other Exercises Other Exercises: arom x 10 FF, elbow flexion and scapula retraction   Shoulder Instructions       General Comments      Pertinent Vitals/ Pain       Pain Assessment: No/denies pain  Home Living                                          Prior Functioning/Environment              Frequency  Min 2X/week        Progress Toward Goals  OT Goals(current goals can now be found in the care plan section)  Progress towards OT goals: Progressing toward goals     Plan      Co-evaluation  AM-PAC OT "6 Clicks" Daily Activity     Outcome Measure   Help from another person eating meals?: A Little Help from another person taking care of personal grooming?: A Little Help from another person toileting, which includes using toliet, bedpan, or urinal?: Total Help from another person bathing (including washing, rinsing, drying)?: A Lot Help from another person to put on and taking off regular upper body clothing?: A Lot Help from another person to put on and taking off regular lower body clothing?: Total 6 Click Score: 12    End of Session    OT Visit Diagnosis: Muscle weakness (generalized) (M62.81)   Activity Tolerance Patient tolerated treatment well   Patient Left in bed;with call bell/phone within reach;with nursing/sitter in room;with bed alarm set   Nurse Communication          Time: 4098-1191 OT Time Calculation (min): 32 min  Charges: OT General Charges $OT Visit: 1 Visit OT Treatments $Self Care/Home Management : 8-22 mins $Therapeutic Exercise: 8-22 mins  Amor Packard S, OTR/L Acute Rehabilitation Services 09/17/2019   Lynne Righi 09/17/2019, 9:22 AM

## 2019-09-17 NOTE — Consult Note (Signed)
Chief Complaint: Patient was seen in consultation today for percutaneous gastric tube placement Chief Complaint  Patient presents with  . Shortness of Breath  . Fever   at the request of Dr Virginia Crews   Supervising Physician: Sandi Mariscal  Patient Status: [facility] - In-pt  History of Present Illness: Barry Swanson is a 86 y.o. male   CVA; HTN CKD 3; AMS- resolved DVT and now on Eliquis  Admitted with fever and SOB: Pneumonia covid neg Aspiration risk Malnutrition Dysphagia  Request for percutaneous gastric tube placement  Imaging reviewed and approved with Dr Pascal Lux   Past Medical History:  Diagnosis Date  . Hepatitis B   . HOH (hard of hearing)   . Hyperlipidemia   . Hypertension   . Stroke ([place])   . UTI (urinary tract infection)     Past Surgical History:  Procedure Laterality Date  . BALLOON DILATION N/A 06/29/2019   Procedure: BALLOON DILATION;  Surgeon: Wonda Horner, MD;  Location: [facility];  Service: Endoscopy;  Laterality: N/A;  . BLADDER STONE REMOVAL     blasted  . CATARACT EXTRACTION W/ INTRAOCULAR LENS  IMPLANT, BILATERAL  2004  . CYSTOSCOPY WITH LITHOLAPAXY N/A 06/01/2019   Procedure: CYSTOSCOPY WITH LITHOLAPAXY;  Surgeon: Ardis Hughs, MD;  Location: [facility];  Service: Urology;  Laterality: N/A;  . ESOPHAGOGASTRODUODENOSCOPY (EGD) WITH PROPOFOL N/A 06/29/2019   Procedure: ESOPHAGOGASTRODUODENOSCOPY (EGD) WITH PROPOFOL WITH POSSIBLE DIL;  Surgeon: Wonda Horner, MD;  Location: [facility];  Service: Endoscopy;  Laterality: N/A;  . PROSTATE SURGERY     trimmed prostate away from urethral tract    Allergies: Patient has no known allergies.  Medications: Prior to Admission medications   Medication Sig Start Date End Date Taking? Authorizing Provider  Apixaban Starter Pack (ELIQUIS STARTER PACK) 5 MG TBPK Take as directed on package: start with two-5mg  tablets twice daily for 3 days. On 08/18/2019, switch to one-5mg  tablet twice daily.  Patient taking differently: Take 5 mg by mouth 2 (two) times daily.  08/15/19  Yes Lavina Hamman, MD  Cholecalciferol (VITAMIN D-3) 125 MCG (5000 UT) TABS Take 5,000 mg by mouth daily.   Yes [provider]  Coenzyme Q10 (COQ10 PO) Take 1 tablet by mouth at bedtime.    Yes [provider]  Cyanocobalamin (VITAMIN B-12) 5000 MCG TBDP Take 5,000 mcg by mouth daily.   Yes [provider]  feeding supplement, ENSURE ENLIVE, (ENSURE ENLIVE) LIQD Take 237 mLs by mouth 2 (two) times daily between meals. 08/15/19  Yes Lavina Hamman, MD  finasteride (PROSCAR) 5 MG tablet Take 5 mg by mouth at bedtime.  01/30/15  Yes [provider]  gabapentin (NEURONTIN) 300 MG capsule Take 300 mg by mouth daily as needed (pain).  01/02/19  Yes [provider]  Garlic 0277 MG TBEC Take 2,000 mg by mouth daily.    Yes [provider]  Multiple Vitamins-Minerals (CENTRUM SILVER) tablet Take 1 tablet by mouth daily. 09/13/12  Yes [provider]  simvastatin (ZOCOR) 10 MG tablet Take 10 mg by mouth at bedtime.    Yes [provider]  zolpidem (AMBIEN) 5 MG tablet Take 1 tablet (5 mg total) by mouth at bedtime as needed for sleep. 08/15/19  Yes Lavina Hamman, MD  Amino Acids-Protein Hydrolys (FEEDING SUPPLEMENT, PRO-STAT SUGAR FREE 64,) LIQD Take 30 mLs by mouth 2 (two) times daily. Patient not taking: Reported on 09/10/2019 08/15/19   Lavina Hamman, MD  Family History  Problem Relation Age of Onset  . Heart disease Mother   . Heart disease Father   . Parkinson's disease Sister   . Lung cancer Brother   . Leukemia Brother     Social History   Socioeconomic History  . Marital status: Single    Spouse name: Not on file  . Number of children: 2  . Years of education: Not on file  . Highest education level: Not on file  Occupational History  . Occupation: retired  Tobacco Use  . Smoking status: Former Smoker    Packs/day: 2.00     Types: Cigarettes    Quit date: 08/17/1998    Years since quitting: 21.0  . Smokeless tobacco: Current User    Types: Chew  Substance and Sexual Activity  . Alcohol use: Not Currently    Comment: not in 15 years  . Drug use: No  . Sexual activity: Not on file  Other Topics Concern  . Not on file  Social History Narrative  . Not on file   Social Determinants of Health   Financial Resource Strain:   . Difficulty of Paying Living Expenses: Not on file  Food Insecurity:   . Worried About Charity fundraiser in the Last Year: Not on file  . Ran Out of Food in the Last Year: Not on file  Transportation Needs:   . Lack of Transportation (Medical): Not on file  . Lack of Transportation (Non-Medical): Not on file  Physical Activity:   . Days of Exercise per Week: Not on file  . Minutes of Exercise per Session: Not on file  Stress:   . Feeling of Stress : Not on file  Social Connections:   . Frequency of Communication with Friends and Family: Not on file  . Frequency of Social Gatherings with Friends and Family: Not on file  . Attends Religious Services: Not on file  . Active Member of Clubs or Organizations: Not on file  . Attends Archivist Meetings: Not on file  . Marital Status: Not on file    Review of Systems: A 12 point ROS discussed and pertinent positives are indicated in the HPI above.  All other systems are negative.  Review of Systems  Constitutional: Positive for activity change, appetite change and fatigue. Negative for fever.  Respiratory: Positive for shortness of breath.   Cardiovascular: Negative for chest pain.  Neurological: Positive for weakness.  Psychiatric/Behavioral: Positive for decreased concentration. Negative for confusion.    Vital Signs: BP (!) 143/75 (BP Location: Left Arm)   Pulse 72   Temp (!) 97.4 F (36.3 C) (Oral)   Resp 20   Ht 5\' 10"  (1.778 m)   Wt 165 lb (74.8 kg)   SpO2 92%   BMI 23.68 kg/m   Physical Exam Vitals  reviewed.  Cardiovascular:     Rate and Rhythm: Normal rate and regular rhythm.  Pulmonary:     Breath sounds: Normal breath sounds. No wheezing.  Abdominal:     Palpations: Abdomen is soft.  Skin:    General: Skin is warm and dry.  Neurological:     Mental Status: He is alert.  Psychiatric:     Comments: Consented with daughter at bedside     Imaging: DG Chest 1 View  Result Date: 09/17/2019 CLINICAL DATA:  Patient with fever and shortness of breath. EXAM: CHEST  1 VIEW COMPARISON:  Chest radiograph 09/10/2019 FINDINGS: Monitoring leads overlie the patient. Stable enlarged  cardiac and mediastinal contours. Aortic atherosclerosis. Interval improvement in interstitial opacities within the right mid and lower lung. Persistent opacities left lung base. IMPRESSION: Mild interval improvement opacities right mid and lower lung. Persistent opacities left lung base. Electronically Signed   By: Lovey Newcomer M.D.   On: 09/17/2019 09:45   NM Pulmonary Perfusion  Result Date: 09/11/2019 CLINICAL DATA:  Short of breath. Concern for pulmonary embolism. History of DVT. EXAM: NUCLEAR MEDICINE PERFUSION LUNG SCAN TECHNIQUE: Perfusion images were obtained in multiple projections after intravenous injection of radiopharmaceutical. Ventilation scans intentionally deferred if perfusion scan and chest x-ray adequate for interpretation during COVID 19 epidemic. RADIOPHARMACEUTICALS:  1.6 mCi Tc-25m MAA IV COMPARISON:  09/10/2019 FINDINGS: No wedge-shaped peripheral perfusion defects within the LEFT or RIGHT lung to suggest acute pulmonary embolism. IMPRESSION: No evidence of acute pulmonary embolism. Electronically Signed   By: Suzy Bouchard M.D.   On: 09/11/2019 13:19   DG Chest Port 1 View  Result Date: 09/10/2019 CLINICAL DATA:  Shortness of breath, fever and hypoxia. EXAM: PORTABLE CHEST 1 VIEW COMPARISON:  August 08, 2019 FINDINGS: Mild infiltrate is seen within the right lung base. There is no evidence  of a pleural effusion or pneumothorax. The heart size and mediastinal contours are within normal limits. The visualized skeletal structures are unremarkable. IMPRESSION: Mild right basilar infiltrate. Electronically Signed   By: Virgina Norfolk M.D.   On: 09/10/2019 20:58   DG Swallowing Func-Speech Pathology  Result Date: 09/15/2019 Objective Swallowing Evaluation: Type of Study: MBS-Modified Barium Swallow Study  Patient Details Name: Barry Swanson MRN: [id number] Date of Birth: June 13, 1929 Today's Date: 09/15/2019 Time: SLP Start Time (ACUTE ONLY): 0809 -SLP Stop Time (ACUTE ONLY): 0831 SLP Time Calculation (min) (ACUTE ONLY): 22 min Past Medical History: Past Medical History: Diagnosis Date . Hepatitis B  . HOH (hard of hearing)  . Hyperlipidemia  . Hypertension  . Stroke ([place])  . UTI (urinary tract infection)  Past Surgical History: Past Surgical History: Procedure Laterality Date . BALLOON DILATION N/A 06/29/2019  Procedure: BALLOON DILATION;  Surgeon: Wonda Horner, MD;  Location: [facility];  Service: Endoscopy;  Laterality: N/A; . BLADDER STONE REMOVAL    blasted . CATARACT EXTRACTION W/ INTRAOCULAR LENS  IMPLANT, BILATERAL  2004 . CYSTOSCOPY WITH LITHOLAPAXY N/A 06/01/2019  Procedure: CYSTOSCOPY WITH LITHOLAPAXY;  Surgeon: Ardis Hughs, MD;  Location: [facility];  Service: Urology;  Laterality: N/A; . ESOPHAGOGASTRODUODENOSCOPY (EGD) WITH PROPOFOL N/A 06/29/2019  Procedure: ESOPHAGOGASTRODUODENOSCOPY (EGD) WITH PROPOFOL WITH POSSIBLE DIL;  Surgeon: Wonda Horner, MD;  Location: [facility];  Service: Endoscopy;  Laterality: N/A; . PROSTATE SURGERY    trimmed prostate away from urethral tract HPI: 86 yo male adm to [facility] with respiratory difficulties, found to have RLL pna.  Pt with h/o esophageal dysmotility, ? Schatzki's ring s/p dilatation 06/2019.  He reports to this SLP, sensation of food lodging in left side of throat and states if he swallows on the right, it goes down better.  He does cough  and bring up food/drink with secretions per his statement with direct question cue.  Pt states his problems have been going on "for years" and denies worsening.  He indicated his "throat was stretched" = pointing to left lower throat. SLP printed his esophagram and endoscopy report reviewing that he had distal esophagus dilated at the [place].  Pt PMH + for prostate cancer, TIA, DVT.  He was recently in the hospital in December 2020.  Swallow evaluation ordered. Pt underwent BSE  yesterday, NT informed SLP that pt coughed excessively and vomited with dinner and this am his tray was removed due to excessive coughing/ wet voice.  Follow up indicated for dysphagia management.  Subjective: pt awake in chair Assessment / Plan / Recommendation CHL IP CLINICAL IMPRESSIONS 09/15/2019 Clinical Impression Patient presents with mild oral dysphagia (? Cognitive vs neuro (old cva) based with suspected primary esophageal deficits.  Oral deficits c/b decreased oral coordination resulting in premature spillage of boluses into pharynx/larynx.  Patient aspirated into airway prior to intiiation of tsp thin barium - silently.  But also likely aspirated later in the study (flouro was off during actual aspiration but barium was visualized in airway) due to oral residuals from sequential straw bolus of thin spilling into open airway.  Delayed cough noted - within 15 seconds - that was not protective.   Small single boluses were not aspirated or penetrated with thin.  No aspiration or penetration with nectar, pudding or moistened cracker boluses.  Upon esophageal sweep, pt with barium filled esophagus.  He consumed only single bolus of cracker and pudding as well as tsp/straw bolus of nectar and approx 3 ounces of thin barium.  Clearance of esophagus appeared impaired with all consistencies and pt's esophagus was full to just below UES at completion of study without his awareness.  Oral deficits may be compensated for but pt's aspiration risk is  due to his esophageal dysmotility (as diagnosed by radiologist in 2020).  Left pt sitting upright as possible given full esophagus upon sliding him into bed.  Again would advise if he desires po with known risks, liquids (NO PUDDING OR GRITS) may be better tolerated than solids/pudding but pt is at risk across all consistencies.  Pt educated to findings using monitor during test.  Will follow up x1 to inform family of dysphagia. SLP Visit Diagnosis Dysphagia, oral phase (R13.11) Attention and concentration deficit following -- Frontal lobe and executive function deficit following -- Impact on safety and function Risk for inadequate nutrition/hydration;Severe aspiration risk   CHL IP TREATMENT RECOMMENDATION 09/15/2019 Treatment Recommendations Therapy as outlined in treatment plan below   Prognosis 09/11/2019 Prognosis for Safe Diet Advancement Fair Barriers to Reach Goals -- Barriers/Prognosis Comment -- CHL IP DIET RECOMMENDATION 09/15/2019 SLP Diet Recommendations NPO except meds;Ice chips PRN after oral care Liquid Administration via -- Medication Administration (No Data) Compensations Small sips/bites;Slow rate Postural Changes Seated upright at 90 degrees;Remain semi-upright after after feeds/meals (Comment)   CHL IP OTHER RECOMMENDATIONS 09/15/2019 Recommended Consults -- Oral Care Recommendations Oral care QID Other Recommendations --   CHL IP FOLLOW UP RECOMMENDATIONS 09/15/2019 Follow up Recommendations None   CHL IP FREQUENCY AND DURATION 09/15/2019 Speech Therapy Frequency (ACUTE ONLY) min 1 x/week Treatment Duration 1 week      CHL IP ORAL PHASE 09/15/2019 Oral Phase Impaired Oral - Pudding Teaspoon -- Oral - Pudding Cup -- Oral - Honey Teaspoon -- Oral - Honey Cup -- Oral - Nectar Teaspoon -- Oral - Nectar Cup Delayed oral transit;Piecemeal swallowing;Decreased bolus cohesion;Lingual/palatal residue Oral - Nectar Straw Delayed oral transit;Piecemeal swallowing;Decreased bolus cohesion;Lingual/palatal residue  Oral - Thin Teaspoon Premature spillage;Lingual/palatal residue Oral - Thin Cup Decreased bolus cohesion;Premature spillage;Lingual/palatal residue Oral - Thin Straw Decreased bolus cohesion;Premature spillage;Lingual/palatal residue Oral - Puree Lingual/palatal residue;Piecemeal swallowing Oral - Mech Soft WFL Oral - Regular -- Oral - Multi-Consistency -- Oral - Pill -- Oral Phase - Comment --  CHL IP PHARYNGEAL PHASE 09/15/2019 Pharyngeal Phase Impaired Pharyngeal- Pudding Teaspoon -- Pharyngeal --  Pharyngeal- Pudding Cup -- Pharyngeal -- Pharyngeal- Honey Teaspoon -- Pharyngeal -- Pharyngeal- Honey Cup -- Pharyngeal -- Pharyngeal- Nectar Teaspoon WFL Pharyngeal Material does not enter airway Pharyngeal- Nectar Cup -- Pharyngeal -- Pharyngeal- Nectar Straw WFL;Delayed swallow initiation-vallecula Pharyngeal Material does not enter airway Pharyngeal- Thin Teaspoon Penetration/Aspiration before swallow Pharyngeal Material enters airway, passes BELOW cords without attempt by patient to eject out (silent aspiration) Pharyngeal- Thin Cup Delayed swallow initiation-vallecula Pharyngeal Material does not enter airway Pharyngeal- Thin Straw Penetration/Apiration after swallow Pharyngeal Material enters airway, passes BELOW cords and not ejected out despite cough attempt by patient Pharyngeal- Puree WFL;Pharyngeal residue - valleculae Pharyngeal -- Pharyngeal- Mechanical Soft WFL Pharyngeal Material does not enter airway Pharyngeal- Regular -- Pharyngeal -- Pharyngeal- Multi-consistency -- Pharyngeal -- Pharyngeal- Pill -- Pharyngeal -- Pharyngeal Comment --  CHL IP CERVICAL ESOPHAGEAL PHASE 09/15/2019 Cervical Esophageal Phase Impaired Pudding Teaspoon -- Pudding Cup -- Honey Teaspoon -- Honey Cup -- Nectar Teaspoon -- Nectar Cup -- Nectar Straw -- Thin Teaspoon -- Thin Cup -- Thin Straw -- Puree -- Mechanical Soft -- Regular -- Multi-consistency -- Pill -- Cervical Esophageal Comment appearance of prominent  cricopharyngeus, did not impair barium flow [name] 09/15/2019, 9:46 AM               Labs:  CBC: Recent Labs    09/13/19 0328 09/14/19 0434 09/15/19 0500 09/17/19 0446  WBC 10.1 8.3 7.4 6.7  HGB 10.8* 11.6* 11.9* 11.6*  HCT 36.3* 38.8* 39.7 39.5  PLT 256 248 258 266    COAGS: Recent Labs    04/30/19 2059 09/11/19 0013 09/11/19 1352  INR 1.3* 1.4*  --   APTT 31 34 62*    BMP: Recent Labs    09/14/19 0434 09/15/19 0500 09/16/19 0430 09/17/19 0446  NA 144 145 145 147*  K 3.9 3.9 3.6 3.8  CL 114* 112* 111 114*  CO2 21* 21* 22 23  GLUCOSE 101* 104* 104* 95  BUN 19 21 21 22   CALCIUM 9.4 9.5 9.4 9.4  CREATININE 1.50* 1.64* 1.58* 1.55*  GFRNONAA 41* 37* 38* 39*  GFRAA 47* 43* 45* 46*    LIVER FUNCTION TESTS: Recent Labs    09/11/19 0447 09/14/19 0434 09/15/19 0500 09/17/19 0446  BILITOT 0.7 1.1 1.5* 0.5  AST 17 7* 8* 10*  ALT 16 10 11 8   ALKPHOS 53 49 51 39  PROT 5.6* 5.2* 5.7* 5.4*  ALBUMIN 2.8* 2.5* 2.8* 2.5*    TUMOR MARKERS: No results for input(s): AFPTM, CEA, CA199, CHROMGRNA in the last 8760 hours.  Assessment and Plan:  Dysphagia Aspiration risk Malnutrition Scheduled for percutaneous G tube in IR Tuesday 2/2 Risks and benefits image guided gastrostomy tube placement was discussed with the patient and daughter at bedside including, but not limited to the need for a barium enema during the procedure, bleeding, infection, peritonitis and/or damage to adjacent structures.  All of the patient's questions were answered, patient is agreeable to proceed. Consent signed and in chart.   Thank you for this interesting consult.  I greatly enjoyed meeting Barry Swanson and look forward to participating in their care.  A copy of this report was sent to the requesting provider on this date.  Electronically Signed: Lavonia Drafts, PA-C 09/17/2019, 12:49 PM   I spent a total of 40 Minutes    in face to face in clinical consultation,  greater than 50% of which was counseling/coordinating care for percutaneous gastric tube placement

## 2019-09-17 NOTE — Progress Notes (Addendum)
PROGRESS NOTE    Barry Swanson  EGB:[id number] DOB: 10-22-1928 DOA: 09/10/2019 PCP: [name], MD   Brief Narrative:  Barry Swanson a 86 y.o.malewith medical history significant ofHTN, prior stroke, CKD stage 3. Admitted in Dec for AMS due to UTI, complicated by DVT. Discharged on eliquis which he has remained on. Pt presents to ED with 2 day history of worsening fever and SOB. Tm at home 103, no cough. No known COVID exposures.  In the ED, temp 101.4, satting in the upper 80s on room air, improved to mid 90s on 3 L via [place].  D-dimer 0.53, WBC 10,000, procalcitonin 0.14, Covid POC and PCR negative.  Chest x-ray shows right lower lobe infiltrate Patient was seen and examined at bedside.  He is alert and oriented x2 with intermittent confusion and attempting to get out of bed.  Not in acute respiratory distress.  On 4 L O2 by nasal cannula with saturation above 90%.  Patient has decreased respiratory excursion  from being drowsy.  Encourage more wakefulness and titrate oxygen down to baseline 3 L..  Assessment & Plan:   Principal Problem:   Right lower lobe pneumonia Active Problems:   Essential hypertension   CKD (chronic kidney disease) stage 3, GFR 30-59 ml/min   Acute respiratory failure with hypoxia (HCC)   Fever   History of DVT (deep vein thrombosis)  Clinical problems list 1.  Sepsis/right lower lobe pneumonia/acute respiratory failure with hypoxia 2.  Essential hypertension 3.  Chronic kidney disease stage IIIb 4.  History of chronic left lower extremity DVT 5.  Benign prostatic hyperplasia 6.  History of CVA 7.  Hyperlipidemia 8.  Chronic dysphagia 9.  Chronic heart failure with preserved ejection fraction   1.  Sepsis/right lower lobe pneumonia/acute respiratory failure with hypoxia likely due to aspiration.  Patient presented with fever, tachycardia, hypoxic respiratory failure, elevated procalcitonin and lactic acid, and chest x-ray finding suggestive of right lower  lobe pneumonia as a source. Patient currently on cefepime and doxycycline Continue with oxygen supplementation Continue with bronchodilators and monitor inflammatory markers Obtain DG chest x1 view today.  Chest x-ray showed mild interval improvement in opacity of right mid and lower lung fields. Continue with antibiotics for a total of 10 days then de-escalate.  2.  Essential hypertension.  Blood pressure currently labile Continue to monitor and initiate antihypertensive medication  3.  Chronic kidney disease stage IIIb.  Stable Continue to monitor renal function Avoid nephrotoxic agents  4.  History of chronic left lower extremity DVT.  On apixaban Continue with anticoagulation  5.  Benign prostatic hyperplasia Continue with finasteride  6.  History of CVA Continue with aspirin and simvastatin  7.  Hyperlipidemia Continue with simvastatin  8.  Chronic dysphagia.  Due to Schatzki ring and esophageal dilation on 06/29/2019.  Continue to have significant trouble swallowing. Speech and swallow evaluated and recommended nectar thick liquid, thin liquid, teaspoon amount of Ensure, water and ice. Due to concern for continued aspiration with resultant right lower lobe pneumonia.  Patient was put on n.p.o. and pending reevaluation by speech and swallow. Patient is n.p.o. for now Continue with IV fluid D5/normal saline at 75 GI recommended outpatient esophageal dilatation. Family requested for modified barium swallow.  Will consult speech and swallow to evaluate for suitability. Patient may benefit from PEG tube placement Patient is status post modified barium swallow with no significant change in his dysphagia.  Speech and swallow recommended only liquid.  I discussed with patient's daughter  on the need to consider PEG tube placement. I called in consult to IR and spoke with Dr. Pascal Lux.  Order has been put in for IR gastrostomy tube placement. IR plan for G-tube placement on  Tuesday. Eliquis held today 09/16/2018 x2 days  Restart Eliquis after procedure on Tuesday, 09/19/2019   9.  Chronic heart failure with preserved ejection fraction.  Last echocardiogram on 08/08/19 showed left ventricular ejection fraction, by visual estimation, is 55 to 60%. The left ventricle has normal function. There is mildly increased left ventricular hypertrophy.  Not in acute exacerbation Continue with aspirin, statin, beta-blocker Lasix as needed 2 g sodium diet  DVT prophylaxis: Apixaban  Code Status: DNR  Family Communication: None at bedside Disposition Plan: Order has been put in for IR gastrostomy tube placement.  IR is informed about patient and will plan end date for the procedure.  Consultants:   IR  Procedures: None  Antimicrobials:  Cefepime Doxycycline   Subjective: Patient was seen and examined at bedside.  Patient is alert but disoriented.  Not in acute distress.  Patient was able to enter into conversation but has significant cognitive impairment.  Oxygen uptitrated to 4 L to maintain saturation above 90%.  Patient in and out of drowsiness with reduced respiratory excursion.  Objective: Vitals:   09/16/19 1321 09/16/19 1930 09/16/19 1933 09/17/19 0557  BP: (!) 136/96 134/85  (!) 143/75  Pulse: 75 75 73 72  Resp: (!) 21 20  20   Temp: 97.8 F (36.6 C) 97.9 F (36.6 C)  (!) 97.4 F (36.3 C)  TempSrc: Oral Oral  Oral  SpO2: 94% 91% 95% 92%  Weight:      Height:        Intake/Output Summary (Last 24 hours) at 09/17/2019 1041 Last data filed at 09/17/2019 1009 Gross per 24 hour  Intake 912.11 ml  Output 1200 ml  Net -287.89 ml   Filed Weights   09/10/19 2033  Weight: 74.8 kg    Examination:  General exam: Appears alert but disoriented and calm  On 4 L O2 by nasal cannula Respiratory system: Bibasilar rhonchi worse on right. Cardiovascular system: S1 & S2 heard, RRR. No JVD, murmurs, rubs, gallops or clicks. No pedal edema. Gastrointestinal  system: Abdomen is nondistended, soft and nontender. No organomegaly or masses felt. Normal bowel sounds heard. Central nervous system: Alert and oriented. No focal neurological deficits. Extremities: Symmetric 5 x 5 power. Skin: No rashes, lesions or ulcers Psychiatry: Alert and oriented x2.  Mild cognitive impairment     Data Reviewed: I have personally reviewed following labs and imaging studies  CBC: Recent Labs  Lab 09/10/19 2020 09/11/19 0447 09/12/19 0411 09/13/19 0328 09/14/19 0434 09/15/19 0500 09/17/19 0446  WBC 10.0   < > 13.4* 10.1 8.3 7.4 6.7  NEUTROABS 8.3*  --   --  7.2 5.7 5.3  --   HGB 12.7*   < > 10.7* 10.8* 11.6* 11.9* 11.6*  HCT 40.8   < > 36.5* 36.3* 38.8* 39.7 39.5  MCV 90.7   < > 95.3 94.8 94.9 93.9 95.0  PLT 279   < > 236 256 248 258 266   < > = values in this interval not displayed.   Basic Metabolic Panel: Recent Labs  Lab 09/13/19 0328 09/14/19 0434 09/15/19 0500 09/16/19 0430 09/17/19 0446  NA 143 144 145 145 147*  K 4.3 3.9 3.9 3.6 3.8  CL 112* 114* 112* 111 114*  CO2 23 21* 21* 22 23  GLUCOSE 96 101* 104* 104* 95  BUN 21 19 21 21 22   CREATININE 1.51* 1.50* 1.64* 1.58* 1.55*  CALCIUM 8.8* 9.4 9.5 9.4 9.4  MG  --  2.0 2.1  --  2.0  PHOS  --  2.8 3.0  --  2.9   GFR: Estimated Creatinine Clearance: 34 mL/min (A) (by C-G formula based on SCr of 1.55 mg/dL (H)). Liver Function Tests: Recent Labs  Lab 09/10/19 2020 09/11/19 0447 09/14/19 0434 09/15/19 0500 09/17/19 0446  AST 20 17 7* 8* 10*  ALT 15 16 10 11 8   ALKPHOS 57 53 49 51 39  BILITOT 0.6 0.7 1.1 1.5* 0.5  PROT 5.6* 5.6* 5.2* 5.7* 5.4*  ALBUMIN 3.0* 2.8* 2.5* 2.8* 2.5*   No results for input(s): LIPASE, AMYLASE in the last 168 hours. No results for input(s): AMMONIA in the last 168 hours. Coagulation Profile: Recent Labs  Lab 09/11/19 0013  INR 1.4*   Cardiac Enzymes: No results for input(s): CKTOTAL, CKMB, CKMBINDEX, TROPONINI in the last 168 hours. BNP (last 3  results) No results for input(s): PROBNP in the last 8760 hours. HbA1C: No results for input(s): HGBA1C in the last 72 hours. CBG: No results for input(s): GLUCAP in the last 168 hours. Lipid Profile: No results for input(s): CHOL, HDL, LDLCALC, TRIG, CHOLHDL, LDLDIRECT in the last 72 hours. Thyroid Function Tests: No results for input(s): TSH, T4TOTAL, FREET4, T3FREE, THYROIDAB in the last 72 hours. Anemia Panel: No results for input(s): VITAMINB12, FOLATE, FERRITIN, TIBC, IRON, RETICCTPCT in the last 72 hours. Sepsis Labs: Recent Labs  Lab 09/10/19 2020 09/11/19 0013 09/11/19 1352 09/12/19 0411  PROCALCITON 0.14  --  2.77 2.03  LATICACIDVEN 2.0* 1.0  --   --     Recent Results (from the past 240 hour(s))  Blood Culture (routine x 2)     Status: None   Collection Time: 09/10/19  8:20 PM   Specimen: BLOOD RIGHT FOREARM  Result Value Ref Range Status   Specimen Description   Final    BLOOD RIGHT FOREARM Performed at [facility], [place] [address] [place], [place] [address]    Special Requests   Final    BOTTLES DRAWN AEROBIC AND ANAEROBIC Blood Culture results may not be optimal due to an inadequate volume of blood received in culture bottles Performed at [facility] [address], [place], [place] [address]    Culture   Final    NO GROWTH 5 DAYS Performed at [facility], [place] [address], [place], [place] [address]    Report Status 09/16/2019 FINAL  Final  Urine culture     Status: Abnormal   Collection Time: 09/10/19  8:20 PM   Specimen: Urine, Clean Catch  Result Value Ref Range Status   Specimen Description   Final    URINE, CLEAN CATCH Performed at [facility], [place] [address] [place], [place] [address]    Special Requests   Final    NONE Performed at [facility], [place] [address] [place], [place] [address]    Culture (A)  Final    <10,000 COLONIES/mL INSIGNIFICANT GROWTH Performed at  [facility] [address] [place], [place] [address]    Report Status 09/12/2019 FINAL  Final  Respiratory Panel by RT PCR (Flu A&B, Covid) - Nasopharyngeal Swab     Status: None   Collection Time: 09/10/19 10:18 PM   Specimen: Nasopharyngeal Swab  Result Value Ref  Range Status   SARS Coronavirus 2 by RT PCR NEGATIVE NEGATIVE Final    Comment: (NOTE) SARS-CoV-2 target nucleic acids are NOT DETECTED. The SARS-CoV-2 RNA is generally detectable in upper respiratoy specimens during the acute phase of infection. The lowest concentration of SARS-CoV-2 viral copies this assay can detect is 131 copies/mL. A negative result does not preclude SARS-Cov-2 infection and should not be used as the sole basis for treatment or other patient management decisions. A negative result may occur with  improper specimen collection/handling, submission of specimen other than nasopharyngeal swab, presence of viral mutation(s) within the areas targeted by this assay, and inadequate number of viral copies (<131 copies/mL). A negative result must be combined with clinical observations, patient history, and epidemiological information. The expected result is Negative. Fact Sheet for Patients:  PinkCheek.be Fact Sheet for Healthcare Providers:  GravelBags.it This test is not yet ap proved or cleared by the [place] FDA and  has been authorized for detection and/or diagnosis of SARS-CoV-2 by FDA under an Emergency Use Authorization (EUA). This EUA will remain  in effect (meaning this test can be used) for the duration of the COVID-19 declaration under Section 564(b)(1) of the Act, 21 U.S.C. section 360bbb-3(b)(1), unless the authorization is terminated or revoked sooner.    Influenza A by PCR NEGATIVE NEGATIVE Final   Influenza B by PCR NEGATIVE NEGATIVE Final    Comment: (NOTE) The Xpert Xpress SARS-CoV-2/FLU/RSV assay is intended as an  aid in  the diagnosis of influenza from Nasopharyngeal swab specimens and  should not be used as a sole basis for treatment. Nasal washings and  aspirates are unacceptable for Xpert Xpress SARS-CoV-2/FLU/RSV  testing. Fact Sheet for Patients: PinkCheek.be Fact Sheet for Healthcare Providers: GravelBags.it This test is not yet approved or cleared by the [place] FDA and  has been authorized for detection and/or diagnosis of SARS-CoV-2 by  FDA under an Emergency Use Authorization (EUA). This EUA will remain  in effect (meaning this test can be used) for the duration of the  Covid-19 declaration under Section 564(b)(1) of the Act, 21  U.S.C. section 360bbb-3(b)(1), unless the authorization is  terminated or revoked. Performed at [facility], [place] [address] [place], [place] [address]   MRSA PCR Screening     Status: None   Collection Time: 09/10/19 11:17 PM   Specimen: Nasal Mucosa; Nasopharyngeal  Result Value Ref Range Status   MRSA by PCR NEGATIVE NEGATIVE Final    Comment:        The GeneXpert MRSA Assay (FDA approved for NASAL specimens only), is one component of a comprehensive MRSA colonization surveillance program. It is not intended to diagnose MRSA infection nor to guide or monitor treatment for MRSA infections. Performed at [facility], [place] [address], [place], [place] [address]          Radiology Studies: DG Chest 1 View  Result Date: 09/17/2019 CLINICAL DATA:  Patient with fever and shortness of breath. EXAM: CHEST  1 VIEW COMPARISON:  Chest radiograph 09/10/2019 FINDINGS: Monitoring leads overlie the patient. Stable enlarged cardiac and mediastinal contours. Aortic atherosclerosis. Interval improvement in interstitial opacities within the right mid and lower lung. Persistent opacities left lung base. IMPRESSION: Mild interval improvement opacities right mid and  lower lung. Persistent opacities left lung base. Electronically Signed   By: Lovey Newcomer M.D.   On: 09/17/2019 09:45        Scheduled Meds: . apixaban  5 mg Oral BID  .  chlorhexidine  15 mL Mouth Rinse BID  . feeding supplement (ENSURE ENLIVE)  237 mL Oral BID BM  . finasteride  5 mg Oral QHS  . mouth rinse  15 mL Mouth Rinse q12n4p  . multivitamin with minerals  1 tablet Oral Daily  . simvastatin  10 mg Oral QHS  . traZODone  50 mg Oral QHS   Continuous Infusions: . ceFEPime (MAXIPIME) IV 2 g (09/16/19 2258)  . dextrose    . doxycycline (VIBRAMYCIN) IV 100 mg (09/16/19 2104)     LOS: 7 days    Time spent: [place], MD [facility] Pager [phone]   If 7PM-7AM, please contact night-coverage [email or web address] Password [facility] 09/17/2019, 10:41 AM

## 2019-09-18 LAB — CBC
HCT: 38.9 % — ABNORMAL LOW (ref 39.0–52.0)
Hemoglobin: 11.4 g/dL — ABNORMAL LOW (ref 13.0–17.0)
MCH: 27.5 pg (ref 26.0–34.0)
MCHC: 29.3 g/dL — ABNORMAL LOW (ref 30.0–36.0)
MCV: 93.7 fL (ref 80.0–100.0)
Platelets: 249 10*3/uL (ref 150–400)
RBC: 4.15 MIL/uL — ABNORMAL LOW (ref 4.22–5.81)
RDW: 15.2 % (ref 11.5–15.5)
WBC: 7 10*3/uL (ref 4.0–10.5)
nRBC: 0 % (ref 0.0–0.2)

## 2019-09-18 LAB — COMPREHENSIVE METABOLIC PANEL
ALT: 8 U/L (ref 0–44)
AST: 11 U/L — ABNORMAL LOW (ref 15–41)
Albumin: 2.5 g/dL — ABNORMAL LOW (ref 3.5–5.0)
Alkaline Phosphatase: 39 U/L (ref 38–126)
Anion gap: 10 (ref 5–15)
BUN: 24 mg/dL — ABNORMAL HIGH (ref 8–23)
CO2: 23 mmol/L (ref 22–32)
Calcium: 9 mg/dL (ref 8.9–10.3)
Chloride: 110 mmol/L (ref 98–111)
Creatinine, Ser: 1.36 mg/dL — ABNORMAL HIGH (ref 0.61–1.24)
GFR calc Af Amer: 53 mL/min — ABNORMAL LOW (ref >60)
GFR calc non Af Amer: 46 mL/min — ABNORMAL LOW (ref >60)
Glucose, Bld: 120 mg/dL — ABNORMAL HIGH (ref 70–99)
Potassium: 3.3 mmol/L — ABNORMAL LOW (ref 3.5–5.1)
Sodium: 143 mmol/L (ref 135–145)
Total Bilirubin: 0.6 mg/dL (ref 0.3–1.2)
Total Protein: 5.2 g/dL — ABNORMAL LOW (ref 6.5–8.1)

## 2019-09-18 LAB — APTT: aPTT: 33 seconds (ref 24–36)

## 2019-09-18 LAB — MAGNESIUM: Magnesium: 1.9 mg/dL (ref 1.7–2.4)

## 2019-09-18 LAB — HEPARIN LEVEL (UNFRACTIONATED): Heparin Unfractionated: 1.28 IU/mL — ABNORMAL HIGH (ref 0.30–0.70)

## 2019-09-18 LAB — PHOSPHORUS: Phosphorus: 2.5 mg/dL (ref 2.5–4.6)

## 2019-09-18 MED ORDER — HEPARIN (PORCINE) 25000 UT/250ML-% IV SOLN
1300.0000 [IU]/h | INTRAVENOUS | Status: AC
  Administered 2019-09-18: 1300 [IU]/h via INTRAVENOUS
  Filled 2019-09-18: qty 250

## 2019-09-18 MED ORDER — POTASSIUM CHLORIDE 20 MEQ PO PACK
40.0000 meq | PACK | Freq: Once | ORAL | Status: AC
  Administered 2019-09-18: 40 meq via ORAL
  Filled 2019-09-18: qty 2

## 2019-09-18 NOTE — Progress Notes (Signed)
ANTICOAGULATION CONSULT NOTE   Pharmacy Consult for heparin Indication: hx DVT (home Eliquis on hold)  No Known Allergies  Patient Measurements: Height: 5\' 10"  (177.8 cm) Weight: 165 lb (74.8 kg) IBW/kg (Calculated) : 73 Heparin Dosing Weight: 75 kg  Vital Signs: Temp: 97.8 F (36.6 C) (02/01 0419) Temp Source: Oral (02/01 0419) BP: 137/81 (02/01 0419) Pulse Rate: 58 (02/01 0419)  Labs: Recent Labs    09/16/19 0430 09/17/19 0446 09/18/19 0443  HGB  --  11.6* 11.4*  HCT  --  39.5 38.9*  PLT  --  266 249  CREATININE 1.58* 1.55* 1.36*    Estimated Creatinine Clearance: 38.8 mL/min (A) (by C-G formula based on SCr of 1.36 mg/dL (H)).   Medications:  - on Eliquis 5 mg bid PTA  Assessment: Patient is an 86 y.o with hx LLE DVT (Aug 09, 2019) on Eliquis PTA, presented to the ED on 1/24 with c/o SOB. He was started on heparin drip on admission and transitioned back to Eliquis on 1/25. Eliquis held on 1/31 in anticipation for perc G tube in IR on 2/2. Pharmacy was consulted on 2/1 to bridge with heparin while pt's off Eliquis.  - last dose of Eliquis taken on 1/30 at 2113   Goal of Therapy:  Heparin level 0.3-0.7 units/ml aPTT 66-102 seconds Monitor platelets by anticoagulation protocol: Yes   Plan:  - start heparin drip at 1300 units/hr - baseline heparin level and aPTT - 8 hr heparin level and aPTT - monitor for s/s bleeding - please advise if/when heparin drip needs to be held for G-tube placement  Aanyah Loa P 09/18/2019,8:42 AM

## 2019-09-18 NOTE — Progress Notes (Signed)
Physical Therapy Treatment Patient Details Name: Barry Swanson MRN: [id number] DOB: Jan 08, 1929 Today's Date: 09/18/2019    History of Present Illness Barry Swanson is a 86 y.o. male with medical history significant of HTN, prior stroke, CKD stage 3.  Pt with recent admission in December for DVT.  Pt admitted with PNE.  Perfusion scan negative for PE.    PT Comments    Pt progressing, requiring decr assist for transfers today.  Continue to recommend 24 hr assist and HHPT if family able to provide, if not may need SNF   Follow Up Recommendations  Home health PT;Supervision/Assistance - 24 hour;SNF     Equipment Recommendations  None recommended by PT    Recommendations for Other Services       Precautions / Restrictions Precautions Precautions: Fall Precaution Comments: on 2L O2 [place] Restrictions Weight Bearing Restrictions: No    Mobility  Bed Mobility Overal bed mobility: Needs Assistance Bed Mobility: Supine to Sit     Supine to sit: Min assist     General bed mobility comments: light assist to elevate trunk, HOB elevated   Transfers Overall transfer level: Needs assistance Equipment used: Rolling walker (2 wheeled) Transfers: Sit to/from [facility] Sit to Stand: Min assist Stand pivot transfers: Min assist       General transfer comment: incr time, cues for hand placement, safety  Ambulation/Gait                 Stairs             Wheelchair Mobility    Modified Rankin (Stroke Patients Only)       Balance                                            Cognition Arousal/Alertness: Awake/alert Behavior During Therapy: WFL for tasks assessed/performed;Agitated Overall Cognitive Status: No family/caregiver present to determine baseline cognitive functioning                                 General Comments: pleasantly, intermittently confused although conversant and follows commands consistently        Exercises      General Comments General comments (skin integrity, edema, etc.): pt entire bed/pad and also towel soaked in urine. PT changed pt's gown. informed NT      Pertinent Vitals/Pain Pain Assessment: No/denies pain    Home Living                      Prior Function            PT Goals (current goals can now be found in the care plan section) Acute Rehab PT Goals Patient Stated Goal: return home PT Goal Formulation: With patient Time For Goal Achievement: 09/25/19 Potential to Achieve Goals: Good Progress towards PT goals: Progressing toward goals    Frequency    Min 3X/week      PT Plan Current plan remains appropriate    Co-evaluation              AM-PAC PT "6 Clicks" Mobility   Outcome Measure  Help needed turning from your back to your side while in a flat bed without using bedrails?: A Little Help needed moving from lying on your back to sitting on the side of  a flat bed without using bedrails?: A Little Help needed moving to and from a bed to a chair (including a wheelchair)?: A Little Help needed standing up from a chair using your arms (e.g., wheelchair or bedside chair)?: A Little Help needed to walk in hospital room?: A Little Help needed climbing 3-5 steps with a railing? : A Lot 6 Click Score: 17    End of Session Equipment Utilized During Treatment: Oxygen;Gait belt Activity Tolerance: Patient tolerated treatment well Patient left: in chair;with call bell/phone within reach;with chair alarm set Nurse Communication: Mobility status PT Visit Diagnosis: Other abnormalities of gait and mobility (R26.89);Muscle weakness (generalized) (M62.81)     Time: 6294-7654 PT Time Calculation (min) (ACUTE ONLY): 18 min  Charges:  $Therapeutic Activity: 8-22 mins                     Baxter Flattery, PT   Acute Rehab Dept [facility]): [phone]   09/18/2019    [facility] 09/18/2019, 1:52 PM

## 2019-09-18 NOTE — Progress Notes (Signed)
Call to Interventional Radiology to inquire about the time pt's PEG tube will be placed tomorrow. They have it planned for 10:30 or 11:00 am.

## 2019-09-18 NOTE — Care Management Important Message (Signed)
Important Message  Patient Details IM Letter given to Dessa Phi RN Case Manager to present to the Patient Name: Barry Swanson MRN: [id number] Date of Birth: [birth year]   Medicare Important Message Given:  Yes     Kerin Salen 09/18/2019, 11:24 AM

## 2019-09-18 NOTE — Progress Notes (Signed)
Nutrition Follow-up  DOCUMENTATION CODES:   Not applicable  INTERVENTION:  - weigh patient today.  - once PEG placed and approved for TF start, recommend: Osmolite 1.2 @ 20 ml/hr to advance by 10 ml every 12 hours to reach goal rate of 60 ml/hr with 30 ml prostat once/day. - at goal rate, this regimen will provide 1828 kcal, 1181 ml free water.  - recommend free water flush of 150 ml QID (600 ml/day).  Monitor magnesium, potassium, and phosphorus daily for at least 3 days, MD to replete as needed, as pt is at risk for refeeding syndrome given very minimal PO intakes for >7 days.   NUTRITION DIAGNOSIS:   Increased nutrient needs related to acute illness as evidenced by estimated needs. -ongoing  GOAL:   Patient will meet greater than or equal to 90% of their needs -unable to meet at this time  MONITOR:   Diet advancement, Labs, Weight trends, Other (Comment)(PEG placement and TF initiation)  ASSESSMENT:   86 y.o. male with medical history of HTN, prior stroke, and stage 3 CKD. He was admitted in December for AMS due to UTI, complicated by DVT. Discharged on eliquis which he has remained on. Patient presented to the ED with 2 day history of worsening fever and SOB. In the ED, temp 101.4, sating in the upper 80s on room air, improved to mid 90s on 3L via [place]. CXR indicated RLL infiltrate.  Diet was advanced from NPO to [place] on 1/25 at 1220 and then returned to NPO on 1/26 at 1730. Per flow sheet documentation, he consumed 20% of breakfast and 10% of lunch on 1/26. He has not been weighed since 1/24.   SLP has been following patient throughout hospitalization. Patient underwent MBS on 1/29 and recommendation was made at that time for NPO.   Palliative Care is following and last note is from 1/29. Discussion was had at that time about PEG and initiation of TF.   Per MD note earlier today, plan is for PEG placement by IR on 2/2.    Labs reviewed; K: 3.3 mmol/l, BUN: 24 mg/dl,  creatinine: 1.36 mg/dl, GFR: 46 ml/min. Medications reviewed; 40 mEq Klor-Con x1 dose 2/1. IVF; D5 @ 100 ml/hr (408 kcal).      NUTRITION - FOCUSED PHYSICAL EXAM:  completed to upper body; no muscle and no fat wasting noted at this time.   Diet Order:   Diet Order            Diet NPO time specified Except for: Sips with Meds  Diet effective midnight        Diet NPO time specified Except for: Sips with Meds, Ice Chips  Diet effective now              EDUCATION NEEDS:   No education needs have been identified at this time  Skin:  Skin Assessment: Reviewed RN Assessment  Last BM:  1/24  Height:   Ht Readings from Last 1 Encounters:  09/10/19 5\' 10"  (1.778 m)    Weight:   Wt Readings from Last 1 Encounters:  09/10/19 74.8 kg    Ideal Body Weight:  75.4 kg  BMI:  Body mass index is 23.68 kg/m.  Estimated Nutritional Needs:   Kcal:  1800-2000 kcal  Protein:  80-90 grams  Fluid:  >/= 2 L/day     Jarome Matin, MS, RD, LDN, [facility] Inpatient Clinical Dietitian Pager # [phone] After hours/weekend pager # [phone]

## 2019-09-18 NOTE — Progress Notes (Signed)
PROGRESS NOTE    Barry Swanson  XQJ:[id number] DOB: Jun 01, 1929 DOA: 09/10/2019 PCP: [name], MD   Brief Narrative:  Barry Swanson a 86 y.o.malewith medical history significant ofHTN, prior stroke, CKD stage 3. Admitted in Dec for AMS due to UTI, complicated by DVT. Discharged on eliquis which he has remained on. Pt presents to ED with 2 day history of worsening fever and SOB. Tm at home 103, no cough. No known COVID exposures. In the ED, temp 101.4, satting in the upper 80s on room air, improved to mid 90s on 3 L via [place]. D-dimer 0.53, WBC 10,000, procalcitonin 0.14, Covid POC and PCR negative. Chest x-ray shows right lower lobe infiltrate. He is continued on Antibiotics, Patient has high aspiration risk because of this chronic dysphagia. He is scheduled to have a PEG tube tomorrow by IR, on heparin gtt. for anticoagulation due to recent history of DVT.   Assessment & Plan:   Principal Problem:   Right lower lobe pneumonia Active Problems:   Essential hypertension   CKD (chronic kidney disease) stage 3, GFR 30-59 ml/min   Acute respiratory failure with hypoxia (HCC)   Fever   History of DVT (deep vein thrombosis)  1.  Sepsis/right lower lobe pneumonia/acute respiratory failure with hypoxia likely due to aspiration.  Patient presented with fever, tachycardia, hypoxic respiratory failure, elevated procalcitonin and lactic acid, and chest x-ray finding suggestive of right lower lobe pneumonia as a source. Patient currently on cefepime and doxycycline Continue with oxygen supplementation Continue with bronchodilators and monitor inflammatory markers Obtain DG chest x1 view today.  Chest x-ray showed mild interval improvement in opacity of right mid and lower lung fields. Continue with antibiotics for a total of 10 days then de-escalate.  DC doxycycline ( day 6)  2.  Essential hypertension.  Blood pressure currently labile Continue to monitor and initiate antihypertensive  medication.  3.  Chronic kidney disease stage IIIb.  Stable Continue to monitor renal function Avoid nephrotoxic agents  4.  History of chronic left lower extremity DVT.  On apixaban Continue with anticoagulation with heparin gtt while awaiting for PEG tube tomorrow.  5.  Benign prostatic hyperplasia Continue with finasteride  6.  History of CVA Continue with aspirin and simvastatin  7.  Hyperlipidemia Continue with simvastatin  8.  Chronic dysphagia.  Due to Schatzki ring and esophageal dilation on 06/29/2019.  Continue to have significant trouble swallowing. Speech and swallow evaluated and recommended nectar thick liquid, thin liquid, teaspoon amount of Ensure, water and ice. Due to concern for continued aspiration with resultant right lower lobe pneumonia.  Patient was put on n.p.o. and pending reevaluation by speech and swallow. Patient is n.p.o. for now Continue with IV fluid D5/normal saline at 75 GI recommended outpatient esophageal dilatation. Family requested for modified barium swallow.  Will consult speech and swallow to evaluate for suitability. Patient may benefit from PEG tube placement Patient is status post modified barium swallow with no significant change in his dysphagia.  Speech and swallow recommended only liquid.  I discussed with patient's daughter on the need to consider PEG tube placement. I called in consult to IR and spoke with Dr. Pascal Lux.  Order has been put in for IR gastrostomy tube placement. IR plan for G-tube placement on Tuesday. Eliquis held today 09/16/2018 x2 days , transitioned to heparin gtt. Restart Eliquis after procedure on Tuesday, 09/19/2019   9.  Chronic heart failure with preserved ejection fraction.  Last echocardiogram on 08/08/19 showed left ventricular ejection  fraction, by visual estimation, is 55 to 60%. The left ventricle has normal function. There is mildly increased left ventricular hypertrophy.  Not in acute  exacerbation Continue with aspirin, statin, beta-blocker Lasix as needed 2 g sodium diet  DVT prophylaxis:  heparin gtt  Code Status: DNR  Family Communication: None at bedside Disposition Plan: Order has been put in for IR gastrostomy tube placement.  IR is informed about patient and will plan end date for the procedure.   Consultants:   IR   Procedures: Scheduled PEG tube tomorrow  Antimicrobials:  Anti-infectives (From admission, onward)   Start     Dose/Rate Route Frequency Ordered Stop   09/15/19 0800  doxycycline (VIBRAMYCIN) 100 mg in sodium chloride 0.9 % 250 mL IVPB  Status:  Discontinued     100 mg 125 mL/hr over 120 Minutes Intravenous Every 12 hours 09/15/19 0736 09/18/19 0836   09/14/19 2200  doxycycline (VIBRA-TABS) tablet 100 mg  Status:  Discontinued     100 mg Oral Every 12 hours 09/14/19 1001 09/15/19 0736   09/13/19 0900  doxycycline (VIBRAMYCIN) 100 mg in sodium chloride 0.9 % 250 mL IVPB  Status:  Discontinued     100 mg 125 mL/hr over 120 Minutes Intravenous Every 12 hours 09/13/19 0807 09/14/19 1001   09/12/19 2200  azithromycin (ZITHROMAX) tablet 500 mg  Status:  Discontinued     500 mg Oral Daily at bedtime 09/12/19 0830 09/13/19 0807   09/11/19 1000  cefTRIAXone (ROCEPHIN) 2 g in sodium chloride 0.9 % 100 mL IVPB  Status:  Discontinued     2 g 200 mL/hr over 30 Minutes Intravenous Every 24 hours 09/10/19 2316 09/11/19 0741   09/11/19 1000  ceFEPIme (MAXIPIME) 2 g in sodium chloride 0.9 % 100 mL IVPB     2 g 200 mL/hr over 30 Minutes Intravenous Every 12 hours 09/11/19 0758     09/10/19 2330  azithromycin (ZITHROMAX) 500 mg in sodium chloride 0.9 % 250 mL IVPB  Status:  Discontinued     500 mg 250 mL/hr over 60 Minutes Intravenous Daily at bedtime 09/10/19 2316 09/12/19 0830   09/10/19 2215  vancomycin (VANCOREADY) IVPB 1500 mg/300 mL     1,500 mg 150 mL/hr over 120 Minutes Intravenous  Once 09/10/19 2133 09/11/19 0257   09/10/19 2145  ceFEPIme  (MAXIPIME) 2 g in sodium chloride 0.9 % 100 mL IVPB     2 g 200 mL/hr over 30 Minutes Intravenous  Once 09/10/19 2133 09/10/19 2343     Subjective: Patient was seen and examined at bedside.  He denies any abdominal pain.  No overnight events.  Patient is alert but has significant cognitive impairment.  Objective: Vitals:   09/17/19 1418 09/17/19 2025 09/18/19 0419 09/18/19 1225  BP: 135/77 (!) 146/77 137/81 (!) 145/82  Pulse:  62 (!) 58 65  Resp:  16 14 16   Temp:  97.6 F (36.4 C) 97.8 F (36.6 C) (!) 97.4 F (36.3 C)  TempSrc:  Oral Oral Oral  SpO2:  98% 97% 99%  Weight:      Height:        Intake/Output Summary (Last 24 hours) at 09/18/2019 1252 Last data filed at 09/18/2019 0800 Gross per 24 hour  Intake 2320.92 ml  Output 1300 ml  Net 1020.92 ml   Filed Weights   09/10/19 2033  Weight: 74.8 kg    Examination:  General exam: Appears calm and comfortable , on 4L O2 by nasal canula. Respiratory system:  Clear to auscultation. Respiratory effort normal. Cardiovascular system: S1 & S2 heard, RRR. No JVD, murmurs, rubs, gallops or clicks. No pedal edema. Gastrointestinal system: Abdomen is nondistended, soft and nontender. No organomegaly or masses felt. Normal bowel sounds heard. Central nervous system: Alert and oriented. No focal neurological deficits. Extremities: No swelling no edema Skin: No rashes, lesions or ulcers Psychiatry: Judgement and insight appear normal. Mood & affect appropriate.    Data Reviewed: I have personally reviewed following labs and imaging studies  CBC: Recent Labs  Lab 09/13/19 0328 09/14/19 0434 09/15/19 0500 09/17/19 0446 09/18/19 0443  WBC 10.1 8.3 7.4 6.7 7.0  NEUTROABS 7.2 5.7 5.3  --   --   HGB 10.8* 11.6* 11.9* 11.6* 11.4*  HCT 36.3* 38.8* 39.7 39.5 38.9*  MCV 94.8 94.9 93.9 95.0 93.7  PLT 256 248 258 266 263   Basic Metabolic Panel: Recent Labs  Lab 09/14/19 0434 09/15/19 0500 09/16/19 0430 09/17/19 0446  09/18/19 0443  NA 144 145 145 147* 143  K 3.9 3.9 3.6 3.8 3.3*  CL 114* 112* 111 114* 110  CO2 21* 21* 22 23 23   GLUCOSE 101* 104* 104* 95 120*  BUN 19 21 21 22  24*  CREATININE 1.50* 1.64* 1.58* 1.55* 1.36*  CALCIUM 9.4 9.5 9.4 9.4 9.0  MG 2.0 2.1  --  2.0 1.9  PHOS 2.8 3.0  --  2.9 2.5   GFR: Estimated Creatinine Clearance: 38.8 mL/min (A) (by C-G formula based on SCr of 1.36 mg/dL (H)). Liver Function Tests: Recent Labs  Lab 09/14/19 0434 09/15/19 0500 09/17/19 0446 09/18/19 0443  AST 7* 8* 10* 11*  ALT 10 11 8 8   ALKPHOS 49 51 39 39  BILITOT 1.1 1.5* 0.5 0.6  PROT 5.2* 5.7* 5.4* 5.2*  ALBUMIN 2.5* 2.8* 2.5* 2.5*   No results for input(s): LIPASE, AMYLASE in the last 168 hours. No results for input(s): AMMONIA in the last 168 hours. Coagulation Profile: No results for input(s): INR, PROTIME in the last 168 hours. Cardiac Enzymes: No results for input(s): CKTOTAL, CKMB, CKMBINDEX, TROPONINI in the last 168 hours. BNP (last 3 results) No results for input(s): PROBNP in the last 8760 hours. HbA1C: No results for input(s): HGBA1C in the last 72 hours. CBG: No results for input(s): GLUCAP in the last 168 hours. Lipid Profile: No results for input(s): CHOL, HDL, LDLCALC, TRIG, CHOLHDL, LDLDIRECT in the last 72 hours. Thyroid Function Tests: No results for input(s): TSH, T4TOTAL, FREET4, T3FREE, THYROIDAB in the last 72 hours. Anemia Panel: No results for input(s): VITAMINB12, FOLATE, FERRITIN, TIBC, IRON, RETICCTPCT in the last 72 hours. Sepsis Labs: Recent Labs  Lab 09/11/19 1352 09/12/19 0411  PROCALCITON 2.77 2.03    Recent Results (from the past 240 hour(s))  Blood Culture (routine x 2)     Status: None   Collection Time: 09/10/19  8:20 PM   Specimen: BLOOD RIGHT FOREARM  Result Value Ref Range Status   Specimen Description   Final    BLOOD RIGHT FOREARM Performed at [facility], [place] [address], [place], [place] [address]    Special Requests    Final    BOTTLES DRAWN AEROBIC AND ANAEROBIC Blood Culture results may not be optimal due to an inadequate volume of blood received in culture bottles Performed at [place] [address] [place], [place] [address]    Culture   Final    NO GROWTH 5 DAYS Performed at [facility], [address] [place], [place] [address]    Report Status 09/16/2019 FINAL  Final  Urine culture     Status: Abnormal   Collection Time: 09/10/19  8:20 PM   Specimen: Urine, Clean Catch  Result Value Ref Range Status   Specimen Description   Final    URINE, CLEAN CATCH Performed at [facility], [place] [address] [place], [place] [address]    Special Requests   Final    NONE Performed at [facility], [place] [address] [place], [place] [address]    Culture (A)  Final    <10,000 COLONIES/mL INSIGNIFICANT GROWTH Performed at [place] [address], [place], [place] [address]    Report Status 09/12/2019 FINAL  Final  Respiratory Panel by RT PCR (Flu A&B, Covid) - Nasopharyngeal Swab     Status: None   Collection Time: 09/10/19 10:18 PM   Specimen: Nasopharyngeal Swab  Result Value Ref Range Status   SARS Coronavirus 2 by RT PCR NEGATIVE NEGATIVE Final    Comment: (NOTE) SARS-CoV-2 target nucleic acids are NOT DETECTED. The SARS-CoV-2 RNA is generally detectable in upper respiratoy specimens during the acute phase of infection. The lowest concentration of SARS-CoV-2 viral copies this assay can detect is 131 copies/mL. A negative result does not preclude SARS-Cov-2 infection and should not be used as the sole basis for treatment or other patient management decisions. A negative result may occur with  improper specimen collection/handling, submission of specimen other than nasopharyngeal swab, presence of viral mutation(s) within the areas targeted by this assay, and inadequate number of viral copies (<131 copies/mL). A  negative result must be combined with clinical observations, patient history, and epidemiological information. The expected result is Negative. Fact Sheet for Patients:  PinkCheek.be Fact Sheet for Healthcare Providers:  GravelBags.it This test is not yet ap proved or cleared by the [place] FDA and  has been authorized for detection and/or diagnosis of SARS-CoV-2 by FDA under an Emergency Use Authorization (EUA). This EUA will remain  in effect (meaning this test can be used) for the duration of the COVID-19 declaration under Section 564(b)(1) of the Act, 21 U.S.C. section 360bbb-3(b)(1), unless the authorization is terminated or revoked sooner.    Influenza A by PCR NEGATIVE NEGATIVE Final   Influenza B by PCR NEGATIVE NEGATIVE Final    Comment: (NOTE) The Xpert Xpress SARS-CoV-2/FLU/RSV assay is intended as an aid in  the diagnosis of influenza from Nasopharyngeal swab specimens and  should not be used as a sole basis for treatment. Nasal washings and  aspirates are unacceptable for Xpert Xpress SARS-CoV-2/FLU/RSV  testing. Fact Sheet for Patients: PinkCheek.be Fact Sheet for Healthcare Providers: GravelBags.it This test is not yet approved or cleared by the [place] FDA and  has been authorized for detection and/or diagnosis of SARS-CoV-2 by  FDA under an Emergency Use Authorization (EUA). This EUA will remain  in effect (meaning this test can be used) for the duration of the  Covid-19 declaration under Section 564(b)(1) of the Act, 21  U.S.C. section 360bbb-3(b)(1), unless the authorization is  terminated or revoked. Performed at [facility], [place] [address], [place],  [address]   MRSA PCR Screening     Status: None   Collection Time: 09/10/19 11:17 PM   Specimen: Nasal Mucosa; Nasopharyngeal  Result Value Ref Range Status    MRSA by PCR NEGATIVE NEGATIVE Final    Comment:  The GeneXpert MRSA Assay (FDA approved for NASAL specimens only), is one component of a comprehensive MRSA colonization surveillance program. It is not intended to diagnose MRSA infection nor to guide or monitor treatment for MRSA infections. Performed at [facility], [place] [address], [place], [place] [address]      Radiology Studies: DG Chest 1 View  Result Date: 09/17/2019 CLINICAL DATA:  Patient with fever and shortness of breath. EXAM: CHEST  1 VIEW COMPARISON:  Chest radiograph 09/10/2019 FINDINGS: Monitoring leads overlie the patient. Stable enlarged cardiac and mediastinal contours. Aortic atherosclerosis. Interval improvement in interstitial opacities within the right mid and lower lung. Persistent opacities left lung base. IMPRESSION: Mild interval improvement opacities right mid and lower lung. Persistent opacities left lung base. Electronically Signed   By: Lovey Newcomer M.D.   On: 09/17/2019 09:45    Scheduled Meds: . chlorhexidine  15 mL Mouth Rinse BID  . finasteride  5 mg Oral QHS  . mouth rinse  15 mL Mouth Rinse q12n4p  . multivitamin with minerals  1 tablet Oral Daily  . simvastatin  10 mg Oral QHS  . traZODone  50 mg Oral QHS   Continuous Infusions: . ceFEPime (MAXIPIME) IV 2 g (09/18/19 1002)  . dextrose 100 mL/hr at 09/18/19 0133  . heparin       LOS: 8 days    Time spent: 25 mins.    Shawna Clamp, MD [facility]   If 7PM-7AM, please contact night-coverage

## 2019-09-19 ENCOUNTER — Inpatient Hospital Stay (HOSPITAL_COMMUNITY): Payer: Medicare Other

## 2019-09-19 HISTORY — PX: IR GASTROSTOMY TUBE MOD SED: IMG625

## 2019-09-19 LAB — CBC
HCT: 40.8 % (ref 39.0–52.0)
Hemoglobin: 12 g/dL — ABNORMAL LOW (ref 13.0–17.0)
MCH: 27.2 pg (ref 26.0–34.0)
MCHC: 29.4 g/dL — ABNORMAL LOW (ref 30.0–36.0)
MCV: 92.5 fL (ref 80.0–100.0)
Platelets: 252 10*3/uL (ref 150–400)
RBC: 4.41 MIL/uL (ref 4.22–5.81)
RDW: 15 % (ref 11.5–15.5)
WBC: 7.3 10*3/uL (ref 4.0–10.5)
nRBC: 0 % (ref 0.0–0.2)

## 2019-09-19 LAB — PROTIME-INR
INR: 1.3 — ABNORMAL HIGH (ref 0.8–1.2)
Prothrombin Time: 16.3 seconds — ABNORMAL HIGH (ref 11.4–15.2)

## 2019-09-19 LAB — BASIC METABOLIC PANEL
Anion gap: 8 (ref 5–15)
BUN: 25 mg/dL — ABNORMAL HIGH (ref 8–23)
CO2: 25 mmol/L (ref 22–32)
Calcium: 9.6 mg/dL (ref 8.9–10.3)
Chloride: 107 mmol/L (ref 98–111)
Creatinine, Ser: 1.26 mg/dL — ABNORMAL HIGH (ref 0.61–1.24)
GFR calc Af Amer: 59 mL/min — ABNORMAL LOW (ref >60)
GFR calc non Af Amer: 51 mL/min — ABNORMAL LOW (ref >60)
Glucose, Bld: 102 mg/dL — ABNORMAL HIGH (ref 70–99)
Potassium: 3.6 mmol/L (ref 3.5–5.1)
Sodium: 140 mmol/L (ref 135–145)

## 2019-09-19 MED ORDER — SIMVASTATIN 20 MG PO TABS
10.0000 mg | ORAL_TABLET | Freq: Every day | ORAL | Status: DC
  Administered 2019-09-19 – 2019-09-25 (×7): 10 mg
  Filled 2019-09-19 (×6): qty 1

## 2019-09-19 MED ORDER — GLUCAGON HCL (RDNA) 1 MG IJ SOLR
INTRAMUSCULAR | Status: AC | PRN
  Administered 2019-09-19: 1 mg via INTRAVENOUS

## 2019-09-19 MED ORDER — GLUCAGON HCL RDNA (DIAGNOSTIC) 1 MG IJ SOLR
INTRAMUSCULAR | Status: AC
  Filled 2019-09-19: qty 1

## 2019-09-19 MED ORDER — FENTANYL CITRATE (PF) 100 MCG/2ML IJ SOLN
INTRAMUSCULAR | Status: AC | PRN
  Administered 2019-09-19: 25 ug via INTRAVENOUS

## 2019-09-19 MED ORDER — MIDAZOLAM HCL 2 MG/2ML IJ SOLN
INTRAMUSCULAR | Status: AC
  Filled 2019-09-19: qty 2

## 2019-09-19 MED ORDER — FENTANYL CITRATE (PF) 100 MCG/2ML IJ SOLN
INTRAMUSCULAR | Status: AC
  Filled 2019-09-19: qty 2

## 2019-09-19 MED ORDER — MIDAZOLAM HCL 2 MG/2ML IJ SOLN
INTRAMUSCULAR | Status: AC | PRN
  Administered 2019-09-19 (×2): 0.5 mg via INTRAVENOUS

## 2019-09-19 MED ORDER — FINASTERIDE 5 MG PO TABS: 5.0000 mg | ORAL_TABLET | Freq: Every day | ORAL | Status: DC

## 2019-09-19 MED ORDER — APIXABAN 5 MG PO TABS
5.0000 mg | ORAL_TABLET | Freq: Two times a day (BID) | ORAL | Status: DC
  Administered 2019-09-19: 5 mg via ORAL
  Filled 2019-09-19 (×2): qty 1

## 2019-09-19 MED ORDER — CHLORHEXIDINE GLUCONATE CLOTH 2 % EX PADS
6.0000 | MEDICATED_PAD | Freq: Every day | CUTANEOUS | Status: DC
  Administered 2019-09-19 – 2019-09-25 (×5): 6 via TOPICAL

## 2019-09-19 MED ORDER — LIDOCAINE-EPINEPHRINE (PF) 2 %-1:200000 IJ SOLN
INTRAMUSCULAR | Status: AC
  Filled 2019-09-19: qty 20

## 2019-09-19 MED ORDER — IOHEXOL 300 MG/ML  SOLN
100.0000 mL | Freq: Once | INTRAMUSCULAR | Status: AC | PRN
  Administered 2019-09-19: 20 mL

## 2019-09-19 MED ORDER — TRAZODONE HCL 50 MG PO TABS
50.0000 mg | ORAL_TABLET | Freq: Every day | ORAL | Status: DC
  Administered 2019-09-19 – 2019-09-25 (×7): 50 mg
  Filled 2019-09-19 (×6): qty 1

## 2019-09-19 MED ORDER — APIXABAN 5 MG PO TABS
5.0000 mg | ORAL_TABLET | Freq: Two times a day (BID) | ORAL | Status: DC
  Administered 2019-09-20 – 2019-09-26 (×13): 5 mg
  Filled 2019-09-19 (×14): qty 1

## 2019-09-19 MED ORDER — OLANZAPINE 5 MG PO TABS
5.0000 mg | ORAL_TABLET | Freq: Two times a day (BID) | ORAL | Status: DC | PRN
  Administered 2019-09-22 – 2019-09-26 (×3): 5 mg
  Filled 2019-09-19 (×3): qty 1

## 2019-09-19 MED ORDER — LIDOCAINE-EPINEPHRINE (PF) 2 %-1:200000 IJ SOLN
INTRAMUSCULAR | Status: AC | PRN
  Administered 2019-09-19: 10 mL

## 2019-09-19 NOTE — Progress Notes (Signed)
PROGRESS NOTE    [place] Swanson  TZG:[id number] DOB: Apr 21, 1929 DOA: 09/10/2019 PCP: [name], MD   Brief Narrative:  Barry Swanson a 86 y.o.malewith medical history significant ofHTN, prior stroke, CKD stage 3. Admitted in Dec for AMS due to UTI, complicated by DVT. Discharged on eliquis which he has remained on. Pt presents to ED with 2 day history of worsening fever and SOB. Tm at home 103, no cough. No known COVID exposures. In the ED, temp 101.4, satting in the upper 80s on room air, improved to mid 90s on 3 L via [place]. D-dimer 0.53, WBC 10,000, procalcitonin 0.14, Covid POC and PCR negative. Chest x-ray shows right lower lobe infiltrate. He is continued on Antibiotics, Patient has high aspiration risk because of this chronic dysphagia. He underwent successful IR placed PEG tube, he was placed on heparin drip in anticipation for procedure.  He will be started on Eliquis post procedure, PEG tube will be used within 24 hours for feeds.   Assessment & Plan:   Principal Problem:   Right lower lobe pneumonia Active Problems:   Essential hypertension   CKD (chronic kidney disease) stage 3, GFR 30-59 ml/min   Acute respiratory failure with hypoxia (HCC)   Fever   History of DVT (deep vein thrombosis)  1.  Sepsis/right lower lobe pneumonia/acute respiratory failure with hypoxia likely due to aspiration.  Patient presented with fever, tachycardia, hypoxic respiratory failure, elevated procalcitonin and lactic acid, and chest x-ray finding suggestive of right lower lobe pneumonia as a source. Patient currently on cefepime and doxycycline Continue with oxygen supplementation Continue with bronchodilators and monitor inflammatory markers Obtain DG chest x1 view today.  Chest x-ray showed mild interval improvement in opacity of right mid and lower lung fields. Continue with antibiotics for a total of 10 days then de-escalate.  DC doxycycline ( day 6)  2.  Essential hypertension.  Blood  pressure currently labile Continue to monitor and initiate antihypertensive medication.  3.  Chronic kidney disease stage IIIb.  Stable Continue to monitor renal function Avoid nephrotoxic agents  4.  History of chronic left lower extremity DVT.  On apixaban Continue with anticoagulation with heparin gtt while awaiting for PEG tube tomorrow. Resume Eliquis today post procedure.  Okayed by IR  5.  Benign prostatic hyperplasia Continue with finasteride  6.  History of CVA Continue with aspirin and simvastatin  7.  Hyperlipidemia Continue with simvastatin  8.  Chronic dysphagia.  Due to Schatzki ring and esophageal dilation on 06/29/2019.  Continue to have significant trouble swallowing. Speech and swallow evaluated and recommended nectar thick liquid, thin liquid, teaspoon amount of Ensure, water and ice. Due to concern for continued aspiration with resultant right lower lobe pneumonia.  Patient was put on n.p.o. and pending reevaluation by speech and swallow. Patient is n.p.o. for now Continue with IV fluid D5/normal saline at 75 GI recommended outpatient esophageal dilatation. Family requested for modified barium swallow.  Will consult speech and swallow to evaluate for suitability. Patient may benefit from PEG tube placement Patient is status post modified barium swallow with no significant change in his dysphagia.  Speech and swallow recommended only liquid.  I discussed with patient's daughter on the need to consider PEG tube placement. I called in consult to IR and spoke with Dr. Pascal Lux.  Order has been put in for IR gastrostomy tube placement. IR plan for G-tube placement on Tuesday. Eliquis held today 09/16/2018 x2 days , transitioned to heparin gtt. Restart Eliquis after procedure  on Tuesday, 09/19/2019 Underwent successful PEG tube placement by IR,  will resume feeds within 24 hours   9.  Chronic heart failure with preserved ejection fraction.  Last echocardiogram on  08/08/19 showed left ventricular ejection fraction, by visual estimation, is 55 to 60%. The left ventricle has normal function. There is mildly increased left ventricular hypertrophy.  Not in acute exacerbation Continue with aspirin, statin, beta-blocker Lasix as needed 2 g sodium diet  DVT prophylaxis:  Eliquis Code Status: DNR  Family Communication: None at bedside Disposition Plan:  Underwent successful PEG tube placement will resume feeds through the PEG tube Barriers to the discharge: We will see whether patient tolerates feeds through the PEG tube   Consultants:   IR   Procedures: PEG tube placement  Antimicrobials:  Anti-infectives (From admission, onward)   Start     Dose/Rate Route Frequency Ordered Stop   09/15/19 0800  doxycycline (VIBRAMYCIN) 100 mg in sodium chloride 0.9 % 250 mL IVPB  Status:  Discontinued     100 mg 125 mL/hr over 120 Minutes Intravenous Every 12 hours 09/15/19 0736 09/18/19 0836   09/14/19 2200  doxycycline (VIBRA-TABS) tablet 100 mg  Status:  Discontinued     100 mg Oral Every 12 hours 09/14/19 1001 09/15/19 0736   09/13/19 0900  doxycycline (VIBRAMYCIN) 100 mg in sodium chloride 0.9 % 250 mL IVPB  Status:  Discontinued     100 mg 125 mL/hr over 120 Minutes Intravenous Every 12 hours 09/13/19 0807 09/14/19 1001   09/12/19 2200  azithromycin (ZITHROMAX) tablet 500 mg  Status:  Discontinued     500 mg Oral Daily at bedtime 09/12/19 0830 09/13/19 0807   09/11/19 1000  cefTRIAXone (ROCEPHIN) 2 g in sodium chloride 0.9 % 100 mL IVPB  Status:  Discontinued     2 g 200 mL/hr over 30 Minutes Intravenous Every 24 hours 09/10/19 2316 09/11/19 0741   09/11/19 1000  ceFEPIme (MAXIPIME) 2 g in sodium chloride 0.9 % 100 mL IVPB     2 g 200 mL/hr over 30 Minutes Intravenous Every 12 hours 09/11/19 0758     09/10/19 2330  azithromycin (ZITHROMAX) 500 mg in sodium chloride 0.9 % 250 mL IVPB  Status:  Discontinued     500 mg 250 mL/hr over 60 Minutes  Intravenous Daily at bedtime 09/10/19 2316 09/12/19 0830   09/10/19 2215  vancomycin (VANCOREADY) IVPB 1500 mg/300 mL     1,500 mg 150 mL/hr over 120 Minutes Intravenous  Once 09/10/19 2133 09/11/19 0257   09/10/19 2145  ceFEPIme (MAXIPIME) 2 g in sodium chloride 0.9 % 100 mL IVPB     2 g 200 mL/hr over 30 Minutes Intravenous  Once 09/10/19 2133 09/10/19 2343     Subjective: Patient was seen and examined at bedside.  He denies any abdominal pain.  No overnight events.  Patient is alert but has significant cognitive impairment.  Objective: Vitals:   09/19/19 1110 09/19/19 1115 09/19/19 1120 09/19/19 1125  BP: (!) 152/77 127/82 128/74 (!) 147/81  Pulse: 73 74 86 79  Resp: (!) 21 18 14 19   Temp:      TempSrc:      SpO2: 92% 93% 92% 91%  Weight:      Height:        Intake/Output Summary (Last 24 hours) at 09/19/2019 1458 Last data filed at 09/19/2019 1030 Gross per 24 hour  Intake 970.43 ml  Output 1050 ml  Net -79.57 ml   [employer]  09/10/19 2033  Weight: 74.8 kg    Examination:  General exam: Appears calm and comfortable , on 4L O2 by nasal canula. Respiratory system: Clear to auscultation. Respiratory effort normal. Cardiovascular system: S1 & S2 heard, RRR. No JVD, murmurs, rubs, gallops or clicks. No pedal edema. Gastrointestinal system: Abdomen is nondistended, soft and nontender. No organomegaly or masses felt. Normal bowel sounds heard. Central nervous system: Alert and oriented. No focal neurological deficits. Extremities: No swelling no edema Skin: No rashes, lesions or ulcers Psychiatry: Judgement and insight appear normal. Mood & affect appropriate.    Data Reviewed: I have personally reviewed following labs and imaging studies  CBC: Recent Labs  Lab 09/13/19 0328 09/13/19 0328 09/14/19 0434 09/15/19 0500 09/17/19 0446 09/18/19 0443 09/19/19 0430  WBC 10.1   < > 8.3 7.4 6.7 7.0 7.3  NEUTROABS 7.2  --  5.7 5.3  --   --   --   HGB 10.8*   < >  11.6* 11.9* 11.6* 11.4* 12.0*  HCT 36.3*   < > 38.8* 39.7 39.5 38.9* 40.8  MCV 94.8   < > 94.9 93.9 95.0 93.7 92.5  PLT 256   < > 248 258 266 249 252   < > = values in this interval not displayed.   Basic Metabolic Panel: Recent Labs  Lab 09/14/19 0434 09/14/19 0434 09/15/19 0500 09/16/19 0430 09/17/19 0446 09/18/19 0443 09/19/19 0430  NA 144   < > 145 145 147* 143 140  K 3.9   < > 3.9 3.6 3.8 3.3* 3.6  CL 114*   < > 112* 111 114* 110 107  CO2 21*   < > 21* 22 23 23 25   GLUCOSE 101*   < > 104* 104* 95 120* 102*  BUN 19   < > 21 21 22  24* 25*  CREATININE 1.50*   < > 1.64* 1.58* 1.55* 1.36* 1.26*  CALCIUM 9.4   < > 9.5 9.4 9.4 9.0 9.6  MG 2.0  --  2.1  --  2.0 1.9  --   PHOS 2.8  --  3.0  --  2.9 2.5  --    < > = values in this interval not displayed.   GFR: Estimated Creatinine Clearance: 41.8 mL/min (A) (by C-G formula based on SCr of 1.26 mg/dL (H)). Liver Function Tests: Recent Labs  Lab 09/14/19 0434 09/15/19 0500 09/17/19 0446 09/18/19 0443  AST 7* 8* 10* 11*  ALT 10 11 8 8   ALKPHOS 49 51 39 39  BILITOT 1.1 1.5* 0.5 0.6  PROT 5.2* 5.7* 5.4* 5.2*  ALBUMIN 2.5* 2.8* 2.5* 2.5*   No results for input(s): LIPASE, AMYLASE in the last 168 hours. No results for input(s): AMMONIA in the last 168 hours. Coagulation Profile: Recent Labs  Lab 09/19/19 0430  INR 1.3*   Cardiac Enzymes: No results for input(s): CKTOTAL, CKMB, CKMBINDEX, TROPONINI in the last 168 hours. BNP (last 3 results) No results for input(s): PROBNP in the last 8760 hours. HbA1C: No results for input(s): HGBA1C in the last 72 hours. CBG: No results for input(s): GLUCAP in the last 168 hours. Lipid Profile: No results for input(s): CHOL, HDL, LDLCALC, TRIG, CHOLHDL, LDLDIRECT in the last 72 hours. Thyroid Function Tests: No results for input(s): TSH, T4TOTAL, FREET4, T3FREE, THYROIDAB in the last 72 hours. Anemia Panel: No results for input(s): VITAMINB12, FOLATE, FERRITIN, TIBC, IRON,  RETICCTPCT in the last 72 hours. Sepsis Labs: No results for input(s): PROCALCITON, LATICACIDVEN in the last  168 hours.  Recent Results (from the past 240 hour(s))  Blood Culture (routine x 2)     Status: None   Collection Time: 09/10/19  8:20 PM   Specimen: BLOOD RIGHT FOREARM  Result Value Ref Range Status   Specimen Description   Final    BLOOD RIGHT FOREARM Performed at [facility], [place] [address], [place], [place] [address]    Special Requests   Final    BOTTLES DRAWN AEROBIC AND ANAEROBIC Blood Culture results may not be optimal due to an inadequate volume of blood received in culture bottles Performed at [facility] [address] [place], [place] [address]    Culture   Final    NO GROWTH 5 DAYS Performed at [facility], [place] [address] [place], [place] [address]    Report Status 09/16/2019 FINAL  Final  Urine culture     Status: Abnormal   Collection Time: 09/10/19  8:20 PM   Specimen: Urine, Clean Catch  Result Value Ref Range Status   Specimen Description   Final    URINE, CLEAN CATCH Performed at [facility], [place] [address] [place], [place] [address]    Special Requests   Final    NONE Performed at [facility], [place] [address], [place], [place] [address]    Culture (A)  Final    <10,000 COLONIES/mL INSIGNIFICANT GROWTH Performed at [facility] [address], [place], [place] [address]    Report Status 09/12/2019 FINAL  Final  Respiratory Panel by RT PCR (Flu A&B, Covid) - Nasopharyngeal Swab     Status: None   Collection Time: 09/10/19 10:18 PM   Specimen: Nasopharyngeal Swab  Result Value Ref Range Status   SARS Coronavirus 2 by RT PCR NEGATIVE NEGATIVE Final    Comment: (NOTE) SARS-CoV-2 target nucleic acids are NOT DETECTED. The SARS-CoV-2 RNA is generally detectable in upper respiratoy specimens during the acute phase of infection. The lowest concentration of SARS-CoV-2  viral copies this assay can detect is 131 copies/mL. A negative result does not preclude SARS-Cov-2 infection and should not be used as the sole basis for treatment or other patient management decisions. A negative result may occur with  improper specimen collection/handling, submission of specimen other than nasopharyngeal swab, presence of viral mutation(s) within the areas targeted by this assay, and inadequate number of viral copies (<131 copies/mL). A negative result must be combined with clinical observations, patient history, and epidemiological information. The expected result is Negative. Fact Sheet for Patients:  PinkCheek.be Fact Sheet for Healthcare Providers:  GravelBags.it This test is not yet ap proved or cleared by the [place] FDA and  has been authorized for detection and/or diagnosis of SARS-CoV-2 by FDA under an Emergency Use Authorization (EUA). This EUA will remain  in effect (meaning this test can be used) for the duration of the COVID-19 declaration under Section 564(b)(1) of the Act, 21 U.S.C. section 360bbb-3(b)(1), unless the authorization is terminated or revoked sooner.    Influenza A by PCR NEGATIVE NEGATIVE Final   Influenza B by PCR NEGATIVE NEGATIVE Final    Comment: (NOTE) The Xpert Xpress SARS-CoV-2/FLU/RSV assay is intended as an aid in  the diagnosis of influenza from Nasopharyngeal swab specimens and  should not be used as a sole basis for treatment. Nasal washings and  aspirates are unacceptable for Xpert Xpress SARS-CoV-2/FLU/RSV  testing. Fact Sheet for Patients: PinkCheek.be Fact Sheet for Healthcare Providers: GravelBags.it  This test is not yet approved or cleared by the [place] and  has been authorized for detection and/or diagnosis of SARS-CoV-2 by  FDA under an Emergency Use Authorization (EUA). This EUA will  remain  in effect (meaning this test can be used) for the duration of the  Covid-19 declaration under Section 564(b)(1) of the Act, 21  U.S.C. section 360bbb-3(b)(1), unless the authorization is  terminated or revoked. Performed at [facility], [place] [address], [place], [place] [address]   MRSA PCR Screening     Status: None   Collection Time: 09/10/19 11:17 PM   Specimen: Nasal Mucosa; Nasopharyngeal  Result Value Ref Range Status   MRSA by PCR NEGATIVE NEGATIVE Final    Comment:        The GeneXpert MRSA Assay (FDA approved for NASAL specimens only), is one component of a comprehensive MRSA colonization surveillance program. It is not intended to diagnose MRSA infection nor to guide or monitor treatment for MRSA infections. Performed at [facility], [place] [address], [place], [place] [address]      Radiology Studies: IR GASTROSTOMY TUBE MOD SED  Result Date: 09/19/2019 INDICATION: Dysphagia. Please place percutaneous gastrostomy tube for enteric nutrition supplementation purposes. EXAM: PULL TROUGH GASTROSTOMY TUBE PLACEMENT COMPARISON:  CT abdomen and pelvis-04/21/2019 MEDICATIONS: The patient is currently amount to the hospital receiving intravenous antibiotics; Antibiotics were administered within 1 hour of the procedure. Glucagon 1 mg IV CONTRAST:  20 mL of Omnipaque 300 administered into the gastric lumen. ANESTHESIA/SEDATION: Moderate (conscious) sedation was employed during this procedure. A total of Versed 1 mg and Fentanyl 25 mcg was administered intravenously. Moderate Sedation Time: 33 minutes. The patient's level of consciousness and vital signs were monitored continuously by radiology nursing throughout the procedure under my direct supervision. FLUOROSCOPY TIME:  7 minutes, 6 seconds (829 mGy) COMPLICATIONS: None immediate. PROCEDURE: Informed written consent was obtained from the patient and the patient's daughter following  explanation of the procedure, risks, benefits and alternatives. A time out was performed prior to the initiation of the procedure. Ultrasound scanning was performed to demarcate the edge of the left lobe of the liver. Maximal barrier sterile technique utilized including caps, mask, sterile gowns, sterile gloves, large sterile drape, hand hygiene and Betadine prep. The left upper quadrant was sterilely prepped and draped. An oral gastric catheter was inserted into the stomach under fluoroscopy. The existing nasogastric feeding tube was removed. The left costal margin and air opacified transverse colon were identified and avoided. Air was injected into the stomach for insufflation and visualization under fluoroscopy. Under sterile conditions a 17 gauge trocar needle was utilized to access the stomach percutaneously beneath the left subcostal margin after the overlying soft tissues were anesthetized with 1% Lidocaine with epinephrine. Needle position was confirmed within the stomach with aspiration of air and injection of small amount of contrast. A single T tack was deployed for gastropexy. Over an Amplatz guide wire, a 9-French sheath was inserted into the stomach. A snare device was utilized to capture the oral gastric catheter. The snare device was pulled retrograde from the stomach up the esophagus and out the oropharynx. The 20-French pull-through gastrostomy was connected to the snare device and pulled antegrade through the oropharynx down the esophagus into the stomach and then through the percutaneous tract external to the patient. The gastrostomy was assembled externally. Contrast injection confirms position in the stomach. Several spot radiographic images were obtained in various obliquities for documentation. The patient  tolerated procedure well without immediate post procedural complication. FINDINGS: After successful fluoroscopic guided placement, the gastrostomy tube is appropriately positioned with  internal disc against the ventral inner aspect of the gastric lumen. IMPRESSION: Successful fluoroscopic insertion of a 20-French pull-through gastrostomy tube. The gastrostomy may be used immediately for medication administration and in 24 hrs for the initiation of feeds. Electronically Signed   By: Sandi Mariscal M.D.   On: 09/19/2019 11:44    Scheduled Meds: . apixaban  5 mg Oral BID  . chlorhexidine  15 mL Mouth Rinse BID  . Chlorhexidine Gluconate Cloth  6 each Topical Q0600  . finasteride  5 mg Oral QHS  . mouth rinse  15 mL Mouth Rinse q12n4p  . multivitamin with minerals  1 tablet Oral Daily  . simvastatin  10 mg Oral QHS  . traZODone  50 mg Oral QHS   Continuous Infusions: . ceFEPime (MAXIPIME) IV 2 g (09/19/19 0958)  . dextrose 100 mL/hr at 09/18/19 2140     LOS: 9 days    Time spent: 25 mins.    Shawna Clamp, MD [facility]   If 7PM-7AM, please contact night-coverage

## 2019-09-19 NOTE — Progress Notes (Signed)
RN took over care for this pt @2350 

## 2019-09-19 NOTE — Procedures (Signed)
Pre procedure Dx: Dysphagia Post Procedure Dx: Same  Successful fluoroscopic guided insertion of gastrostomy tube.   The gastrostomy tube may be used immediately for medications.   Tube feeds may be initiated in 24 hours as per the primary team.    EBL: None Complications: None immediate  Jay Pernella Ackerley, MD Pager #: [phone]    

## 2019-09-19 NOTE — Progress Notes (Signed)
Heparin gtt stop d/t IV infiltrated; IV team consulted for reinsertion; Pharmacist made aware.

## 2019-09-19 NOTE — Progress Notes (Signed)
Occupational Therapy Treatment Patient Details Name: Barry Swanson MRN: [id number] DOB: Jun 03, 1929 Today's Date: 09/19/2019    History of present illness Barry Swanson is a 86 y.o. male with medical history significant of HTN, prior stroke, CKD stage 3.  Pt with recent admission in December for DVT.  Pt admitted with PNE.  Perfusion scan negative for PE.   OT comments  Pt progressing towards acute OT goals. Pt reporting feeling fatigued but motivated to work with therapy. Focus of session was 3 grooming tasks sitting EOB. Pt then initiating standing from EOB which he did 2x. Pleasantly confused. Pt returned to supine at end of session with bed alarm on and all needs met. D/c plan remains appropriate.    Follow Up Recommendations  Home health OT;Supervision/Assistance - 24 hour    Equipment Recommendations  3 in 1 bedside commode    Recommendations for Other Services      Precautions / Restrictions Precautions Precautions: Fall Precaution Comments: on 2L O2 [place] Restrictions Weight Bearing Restrictions: No       Mobility Bed Mobility Overal bed mobility: Needs Assistance Bed Mobility: Supine to Sit;Sit to Supine     Supine to sit: Min guard;Min assist Sit to supine: Min guard;Min assist   General bed mobility comments: extra time and effort, light steadying assist. brief rest breaks utilized by pt.   Transfers Overall transfer level: Needs assistance Equipment used: Rolling walker (2 wheeled) Transfers: Sit to/from Stand Sit to Stand: Mod assist;Min assist         General transfer comment: Pt initiaiting stand from regular bed height, +2 utilized for safety. On second trial of standing seating elevated and pt requiring only +1 assist. Fatigues easily    Balance Overall balance assessment: Needs assistance Sitting-balance support: Bilateral upper extremity supported;Feet supported Sitting balance-Leahy Scale: Good     Standing balance support: Bilateral upper  extremity supported;During functional activity Standing balance-Leahy Scale: Poor Standing balance comment: with RW. on intial stand maintained significant trunk flexion. on second trial able to stand fully uprigth with cueing provided. Fatigued easily.                            ADL either performed or assessed with clinical judgement   ADL Overall ADL's : Needs assistance/impaired     Grooming: Wash/dry face;Oral care;Brushing hair;Min guard;Minimal assistance;Sitting Grooming Details (indicate cue type and reason): initally needed min A due to balance but progressed to min guard level. Pt completed 3 grooming tasks EOB. Trunk flexed position noted, fatigue?                               General ADL Comments: sat sat EOB for about 10 minutes to complete 3 grooming tasks. Pt then indicated desire to stand. SIt<>stand improved with elevated sitting.     Vision       Perception     Praxis      Cognition Arousal/Alertness: Awake/alert Behavior During Therapy: WFL for tasks assessed/performed(a little giddy?) Overall Cognitive Status: No family/caregiver present to determine baseline cognitive functioning                                 General Comments: pleasantly, intermittently confused although conversant and follows commands consistently         Exercises     Shoulder Instructions  General Comments      Pertinent Vitals/ Pain       Pain Assessment: Faces Faces Pain Scale: Hurts even more Pain Location: pt reports generalized, chronic pain  Pain Descriptors / Indicators: Grimacing;Discomfort Pain Intervention(s): Monitored during session;Repositioned;Limited activity within patient's tolerance  Home Living                                          Prior Functioning/Environment              Frequency  Min 2X/week        Progress Toward Goals  OT Goals(current goals can now be found in the  care plan section)  Progress towards OT goals: Progressing toward goals  Acute Rehab OT Goals Patient Stated Goal: return home OT Goal Formulation: With patient Time For Goal Achievement: 09/26/19 Potential to Achieve Goals: Good ADL Goals Pt Will Transfer to Toilet: with min guard assist;ambulating;bedside commode Pt Will Perform Toileting - Clothing Manipulation and hygiene: with min assist;sit to/from stand Additional ADL Goal #1: pt will perform UB adls and grooming with set up Additional ADL Goal #2: Pt will tolerate 30 minutes adl/UE exercise with 4 rest breaks  Plan Discharge plan remains appropriate    Co-evaluation                 AM-PAC OT "6 Clicks" Daily Activity     Outcome Measure   Help from another person eating meals?: A Little Help from another person taking care of personal grooming?: A Little Help from another person toileting, which includes using toliet, bedpan, or urinal?: Total Help from another person bathing (including washing, rinsing, drying)?: A Lot Help from another person to put on and taking off regular upper body clothing?: A Lot Help from another person to put on and taking off regular lower body clothing?: Total 6 Click Score: 12    End of Session Equipment Utilized During Treatment: Rolling walker;Oxygen  OT Visit Diagnosis: Muscle weakness (generalized) (M62.81)   Activity Tolerance Patient tolerated treatment well;Patient limited by fatigue   Patient Left in bed;with call bell/phone within reach;with bed alarm set   Nurse Communication Mobility status;Other (comment)(cognition)        Time: 7493-5521 OT Time Calculation (min): 27 min  Charges: OT General Charges $OT Visit: 1 Visit OT Treatments $Self Care/Home Management : 23-37 mins  Tyrone Schimke, OT Acute Rehabilitation Services Pager: [phone] Office: [phone]    Hortencia Pilar 09/19/2019, 10:28 AM

## 2019-09-19 NOTE — Progress Notes (Signed)
Daughter (white, Caren Griffins) called this morning asking updates of the patient, questions were answered and verbalized understanding. Also, Daughter requested to speak to the Hospitalist concerning about the PEG placement that is planned today, requested if Hospitalist could call her today. Her tel No. is [phone].

## 2019-09-19 NOTE — Progress Notes (Signed)
PT Cancellation Note  Patient Details Name: Barry Swanson MRN: [id number] DOB: 04-Sep-1928   Cancelled Treatment:    Reason Eval/Treat Not Completed: Pain limiting ability to participate Pt in pain from procedure earlier and politely declined mobility.   Ralphine Hinks,KATHrine E 09/19/2019, 2:14 PM Jannette Spanner PT, DPT Acute Rehabilitation Services Office: [phone]

## 2019-09-19 NOTE — Progress Notes (Signed)
[place] for heparin--> Eliquis Indication: hx DVT  No Known Allergies  Patient Measurements: Height: 5\' 10"  (177.8 cm) Weight: 165 lb (74.8 kg) IBW/kg (Calculated) : 73 Heparin Dosing Weight: 75 kg  Vital Signs: BP: 147/81 (02/02 1125) Pulse Rate: 79 (02/02 1125)  Labs: Recent Labs    09/17/19 0446 09/17/19 0446 09/18/19 0443 09/18/19 0928 09/19/19 0430  HGB 11.6*   < > 11.4*  --  12.0*  HCT 39.5  --  38.9*  --  40.8  PLT 266  --  249  --  252  APTT  --   --   --  33  --   LABPROT  --   --   --   --  16.3*  INR  --   --   --   --  1.3*  HEPARINUNFRC  --   --   --  1.28*  --   CREATININE 1.55*  --  1.36*  --  1.26*   < > = values in this interval not displayed.    Estimated Creatinine Clearance: 41.8 mL/min (A) (by C-G formula based on SCr of 1.26 mg/dL (H)).   Medications:  - on Eliquis 5 mg bid PTA  Assessment: Patient is an 86 y.o with hx LLE DVT (Aug 09, 2019) on Eliquis PTA, presented to the ED on 1/24 with c/o SOB. He was started on heparin drip on admission and transitioned back to Eliquis on 1/25. Eliquis held on 1/31 in anticipation for perc G tube in IR on 2/2. Pharmacy was consulted on 2/1 to bridge with heparin while pt's off Eliquis. G-tube placed on 2/2 with heparin turned off prior to procedure. Per Dr. Pascal Lux, ok to resume anticoagulation back at 4 PM on 2/2. Spoke to Dr. Dwyane Dee, he asked pharmacy to resume Eliquis back this evening (2/2).    Plan:  - resume Eliquis 5 mg bid at 5 PM today - monitor for s/s bleeding  Lynelle Doctor 09/19/2019,12:26 PM

## 2019-09-19 NOTE — Progress Notes (Signed)
Paged Dr Al Corpus, [facility] regarding the heparin drip and advised to stop at 0900am. Will inform pharmacy and incoming day shift nurse.

## 2019-09-20 LAB — BASIC METABOLIC PANEL
Anion gap: 9 (ref 5–15)
BUN: 24 mg/dL — ABNORMAL HIGH (ref 8–23)
CO2: 23 mmol/L (ref 22–32)
Calcium: 9.5 mg/dL (ref 8.9–10.3)
Chloride: 109 mmol/L (ref 98–111)
Creatinine, Ser: 1.52 mg/dL — ABNORMAL HIGH (ref 0.61–1.24)
GFR calc Af Amer: 47 mL/min — ABNORMAL LOW (ref >60)
GFR calc non Af Amer: 40 mL/min — ABNORMAL LOW (ref >60)
Glucose, Bld: 87 mg/dL (ref 70–99)
Potassium: 3.8 mmol/L (ref 3.5–5.1)
Sodium: 141 mmol/L (ref 135–145)

## 2019-09-20 LAB — CBC
HCT: 39.4 % (ref 39.0–52.0)
Hemoglobin: 11.8 g/dL — ABNORMAL LOW (ref 13.0–17.0)
MCH: 27.3 pg (ref 26.0–34.0)
MCHC: 29.9 g/dL — ABNORMAL LOW (ref 30.0–36.0)
MCV: 91.2 fL (ref 80.0–100.0)
Platelets: 248 10*3/uL (ref 150–400)
RBC: 4.32 MIL/uL (ref 4.22–5.81)
RDW: 15.3 % (ref 11.5–15.5)
WBC: 7.7 10*3/uL (ref 4.0–10.5)
nRBC: 0 % (ref 0.0–0.2)

## 2019-09-20 MED ORDER — OSMOLITE 1.2 CAL PO LIQD
1000.0000 mL | ORAL | Status: DC
  Administered 2019-09-20 – 2019-09-25 (×6): 1000 mL
  Filled 2019-09-20 (×10): qty 1000

## 2019-09-20 NOTE — Progress Notes (Signed)
Physical Therapy Treatment Patient Details Name: Barry Swanson MRN: [id number] DOB: 1928/11/26 Today's Date: 09/20/2019    History of Present Illness Barry Swanson is a 86 y.o. male with medical history significant of HTN, prior stroke, CKD stage 3.  Pt with recent admission in December for DVT.  Pt admitted with PNA.  Perfusion scan negative for PE.  Pt s/p PEG placement 09/19/19    PT Comments    Pt assisted with standing and able to take a few steps up [facility] for repositioning.  Pt fatigued quickly.  SpO2 93% on 5L HFNC. If family is unable to assist pt at home, recommend SNF.  Follow Up Recommendations  SNF;Supervision/Assistance - 24 hour     Equipment Recommendations  None recommended by PT    Recommendations for Other Services       Precautions / Restrictions Precautions Precautions: Fall Precaution Comments: PEG with abdominal binder, currently on 5L HFNC    Mobility  Bed Mobility Overal bed mobility: Needs Assistance Bed Mobility: Rolling;Sidelying to Sit;Sit to Sidelying Rolling: Min guard Sidelying to sit: Mod assist     Sit to sidelying: Mod assist General bed mobility comments: verbal cues for log roll technique (new PEG tube placed yesterday, wearing abdominal binder), assist for trunk upright and LEs onto bed  Transfers                    Ambulation/Gait                 Stairs             Wheelchair Mobility    Modified Rankin (Stroke Patients Only)       Balance                                            Cognition Arousal/Alertness: Awake/alert Behavior During Therapy: WFL for tasks assessed/performed Overall Cognitive Status: No family/caregiver present to determine baseline cognitive functioning                                 General Comments: pleasant, does not appear aware of medical conditions, agreeable to attempt mobility as able      Exercises      General Comments         Pertinent Vitals/Pain Pain Assessment: Faces Faces Pain Scale: Hurts little more Pain Location: generalized Pain Descriptors / Indicators: Grimacing;Discomfort Pain Intervention(s): Monitored during session;Repositioned    Home Living                      Prior Function            PT Goals (current goals can now be found in the care plan section) Progress towards PT goals: Progressing toward goals    Frequency    Min 3X/week      PT Plan Current plan remains appropriate    Co-evaluation              AM-PAC PT "6 Clicks" Mobility   Outcome Measure  Help needed turning from your back to your side while in a flat bed without using bedrails?: A Little Help needed moving from lying on your back to sitting on the side of a flat bed without using bedrails?: A Little Help needed moving to and from a  bed to a chair (including a wheelchair)?: A Little Help needed standing up from a chair using your arms (e.g., wheelchair or bedside chair)?: A Little Help needed to walk in hospital room?: A Lot Help needed climbing 3-5 steps with a railing? : Total 6 Click Score: 15    End of Session Equipment Utilized During Treatment: Oxygen;Gait belt Activity Tolerance: Patient limited by fatigue Patient left: in bed;with call bell/phone within reach;with bed alarm set Nurse Communication: Mobility status PT Visit Diagnosis: Other abnormalities of gait and mobility (R26.89);Muscle weakness (generalized) (M62.81)     Time: 0141-0301 PT Time Calculation (min) (ACUTE ONLY): 18 min  Charges:  $Therapeutic Activity: 8-22 mins                     Arlyce Dice, DPT Acute Rehabilitation Services Office: [phone]  York Ram E 09/20/2019, 7:15 PM

## 2019-09-20 NOTE — Progress Notes (Signed)
Patient ID: Barry Swanson, male   DOB: May 27, 1929, 86 y.o.   MRN: [id number] Pt s/p perc G tube placement yesterday; VSS; AF; WBC nl; hgb stable; creat 1.52; G tube intact, insertion site ok, no leaking noted, tender to palpation as expected; binder in place; ok to use tube as needed

## 2019-09-20 NOTE — Progress Notes (Signed)
PROGRESS NOTE    Barry Swanson  VFI:[id number] DOB: 1929/01/10 DOA: 09/10/2019 PCP: [name], MD   Brief Narrative:  Barry Swanson a 86 y.o.malewith medical history significant ofHTN, prior stroke, CKD stage 3. Admitted in Dec for AMS due to UTI, complicated by DVT. Discharged on eliquis which he has remained on. Pt presents to ED with 2 day history of worsening fever and SOB. Tm at home 103, no cough. No known COVID exposures. In the ED, temp 101.4, satting in the upper 80s on room air, improved to mid 90s on 3 L via [place]. D-dimer 0.53, WBC 10,000, procalcitonin 0.14, Covid POC and PCR negative. Chest x-ray shows right lower lobe infiltrate. He is continued on Antibiotics, Patient has high aspiration risk because of this chronic dysphagia. He underwent successful IR placed PEG tube, he was placed on heparin drip in anticipation for procedure.  He will be started on Eliquis post procedure, PEG tube will be used within 24 hours for feeds.   Assessment & Plan:   Principal Problem:   Right lower lobe pneumonia Active Problems:   Essential hypertension   CKD (chronic kidney disease) stage 3, GFR 30-59 ml/min   Acute respiratory failure with hypoxia (HCC)   Fever   History of DVT (deep vein thrombosis)  1.  Sepsis/right lower lobe pneumonia / acute respiratory failure with hypoxia likely due to aspiration.   Patient presented with fever, tachycardia, hypoxic respiratory failure, elevated procalcitonin and lactic acid, and chest x-ray finding suggestive of right lower lobe pneumonia as a source. Patient currently on cefepime( days 9) and doxycycline( 6 days) Continue with oxygen supplementation Continue with bronchodilators and monitor inflammatory markers Obtain DG chest x1 view today.  Chest x-ray showed mild interval improvement in opacity of right mid and lower lung fields. Continue with antibiotics for a total of 10 days then de-escalate.  DC doxycycline ( day 6)  2.  Essential  hypertension.  Blood pressure currently labile Continue to monitor and initiate antihypertensive medication.  3.  Chronic kidney disease stage IIIb.  Stable Continue to monitor renal function Avoid nephrotoxic agents  4.  History of chronic left lower extremity DVT.  On apixaban Continue with anticoagulation with heparin gtt while awaiting for PEG tube tomorrow. Resume Eliquis today post procedure.  Okayed by IR  5.  Benign prostatic hyperplasia Continue with finasteride  6.  History of CVA Continue with aspirin and simvastatin  7.  Hyperlipidemia Continue with simvastatin  8.  Chronic dysphagia.  Due to Schatzki ring and esophageal dilation on 06/29/2019.  Continue to have significant trouble swallowing. Speech and swallow evaluated and recommended nectar thick liquid, thin liquid, teaspoon amount of Ensure, water and ice. Due to concern for continued aspiration with resultant right lower lobe pneumonia.  Patient was put on n.p.o. and pending reevaluation by speech and swallow. Patient is n.p.o. for now Continue with IV fluid D5/normal saline at 75 GI recommended outpatient esophageal dilatation. Family requested for modified barium swallow.  Will consult speech and swallow to evaluate for suitability. Patient may benefit from PEG tube placement Patient is status post modified barium swallow with no significant change in his dysphagia.  Speech and swallow recommended only liquid.  I discussed with patient's daughter on the need to consider PEG tube placement. I called in consult to IR and spoke with Dr. Pascal Lux.  Order has been put in for IR gastrostomy tube placement. IR plan for G-tube placement on Tuesday. Eliquis held today 09/16/2018 x2 days , transitioned  to heparin gtt. Restart Eliquis after procedure on Tuesday, 09/19/2019 Underwent successful PEG tube placement by IR,  will resume feeds within 24 hours.   9.  Chronic heart failure with preserved ejection fraction.   Last echocardiogram on 08/08/19 showed left ventricular ejection fraction, by visual estimation, is 55 to 60%. The left ventricle has normal function. There is mildly increased left ventricular hypertrophy.  Not in acute exacerbation Continue with aspirin, statin, beta-blocker Lasix as needed 2 g sodium diet  DVT prophylaxis:  Eliquis Code Status: DNR  Family Communication: None at bedside Disposition Plan:  Underwent successful PEG tube placement will resume feeds through the PEG tube Barriers to the discharge: We will see whether patient tolerates feeds through the PEG tube   Consultants:   IR   Procedures: PEG tube placement  Antimicrobials:  Anti-infectives (From admission, onward)   Start     Dose/Rate Route Frequency Ordered Stop   09/15/19 0800  doxycycline (VIBRAMYCIN) 100 mg in sodium chloride 0.9 % 250 mL IVPB  Status:  Discontinued     100 mg 125 mL/hr over 120 Minutes Intravenous Every 12 hours 09/15/19 0736 09/18/19 0836   09/14/19 2200  doxycycline (VIBRA-TABS) tablet 100 mg  Status:  Discontinued     100 mg Oral Every 12 hours 09/14/19 1001 09/15/19 0736   09/13/19 0900  doxycycline (VIBRAMYCIN) 100 mg in sodium chloride 0.9 % 250 mL IVPB  Status:  Discontinued     100 mg 125 mL/hr over 120 Minutes Intravenous Every 12 hours 09/13/19 0807 09/14/19 1001   09/12/19 2200  azithromycin (ZITHROMAX) tablet 500 mg  Status:  Discontinued     500 mg Oral Daily at bedtime 09/12/19 0830 09/13/19 0807   09/11/19 1000  cefTRIAXone (ROCEPHIN) 2 g in sodium chloride 0.9 % 100 mL IVPB  Status:  Discontinued     2 g 200 mL/hr over 30 Minutes Intravenous Every 24 hours 09/10/19 2316 09/11/19 0741   09/11/19 1000  ceFEPIme (MAXIPIME) 2 g in sodium chloride 0.9 % 100 mL IVPB     2 g 200 mL/hr over 30 Minutes Intravenous Every 12 hours 09/11/19 0758 09/20/19 2359   09/10/19 2330  azithromycin (ZITHROMAX) 500 mg in sodium chloride 0.9 % 250 mL IVPB  Status:  Discontinued     500  mg 250 mL/hr over 60 Minutes Intravenous Daily at bedtime 09/10/19 2316 09/12/19 0830   09/10/19 2215  vancomycin (VANCOREADY) IVPB 1500 mg/300 mL     1,500 mg 150 mL/hr over 120 Minutes Intravenous  Once 09/10/19 2133 09/11/19 0257   09/10/19 2145  ceFEPIme (MAXIPIME) 2 g in sodium chloride 0.9 % 100 mL IVPB     2 g 200 mL/hr over 30 Minutes Intravenous  Once 09/10/19 2133 09/10/19 2343     Subjective: Patient was seen and examined at bedside.  He denies any abdominal pain.  No overnight events.  Patient is alert but has significant cognitive impairment.  Objective: Vitals:   09/19/19 1512 09/19/19 2021 09/20/19 0411 09/20/19 1308  BP: (!) 148/88 (!) 150/95 127/85 (!) 145/85  Pulse: 73 72 66 67  Resp: 18 20 20 18   Temp: 98.2 F (36.8 C) 98 F (36.7 C) 97.9 F (36.6 C) 97.9 F (36.6 C)  TempSrc: Oral Oral Oral Oral  SpO2: (!) 88% 97% 96% 100%  Weight:      Height:        Intake/Output Summary (Last 24 hours) at 09/20/2019 1450 Last data filed at 09/20/2019 0800  Gross per 24 hour  Intake 1092.97 ml  Output 750 ml  Net 342.97 ml   Filed Weights   09/10/19 2033  Weight: 74.8 kg    Examination:  General exam: Appears calm and comfortable , on 4L O2 by nasal canula. Respiratory system: Clear to auscultation. Respiratory effort normal. Cardiovascular system: S1 & S2 heard, RRR. No JVD, murmurs, rubs, gallops or clicks. No pedal edema. Gastrointestinal system: Abdomen is nondistended, soft and nontender. PEG tube noted, no erythema around. Normal bowel sounds heard. Central nervous system: Alert and oriented. No focal neurological deficits. Extremities: No swelling no edema Skin: No rashes, lesions or ulcers   Data Reviewed: I have personally reviewed following labs and imaging studies  CBC: Recent Labs  Lab 09/14/19 0434 09/14/19 0434 09/15/19 0500 09/17/19 0446 09/18/19 0443 09/19/19 0430 09/20/19 0509  WBC 8.3   < > 7.4 6.7 7.0 7.3 7.7  NEUTROABS 5.7  --  5.3   --   --   --   --   HGB 11.6*   < > 11.9* 11.6* 11.4* 12.0* 11.8*  HCT 38.8*   < > 39.7 39.5 38.9* 40.8 39.4  MCV 94.9   < > 93.9 95.0 93.7 92.5 91.2  PLT 248   < > 258 266 249 252 248   < > = values in this interval not displayed.   Basic Metabolic Panel: Recent Labs  Lab 09/14/19 0434 09/14/19 0434 09/15/19 0500 09/15/19 0500 09/16/19 0430 09/17/19 0446 09/18/19 0443 09/19/19 0430 09/20/19 0509  NA 144   < > 145   < > 145 147* 143 140 141  K 3.9   < > 3.9   < > 3.6 3.8 3.3* 3.6 3.8  CL 114*   < > 112*   < > 111 114* 110 107 109  CO2 21*   < > 21*   < > 22 23 23 25 23   GLUCOSE 101*   < > 104*   < > 104* 95 120* 102* 87  BUN 19   < > 21   < > 21 22 24* 25* 24*  CREATININE 1.50*   < > 1.64*   < > 1.58* 1.55* 1.36* 1.26* 1.52*  CALCIUM 9.4   < > 9.5   < > 9.4 9.4 9.0 9.6 9.5  MG 2.0  --  2.1  --   --  2.0 1.9  --   --   PHOS 2.8  --  3.0  --   --  2.9 2.5  --   --    < > = values in this interval not displayed.   GFR: Estimated Creatinine Clearance: 34.7 mL/min (A) (by C-G formula based on SCr of 1.52 mg/dL (H)). Liver Function Tests: Recent Labs  Lab 09/14/19 0434 09/15/19 0500 09/17/19 0446 09/18/19 0443  AST 7* 8* 10* 11*  ALT 10 11 8 8   ALKPHOS 49 51 39 39  BILITOT 1.1 1.5* 0.5 0.6  PROT 5.2* 5.7* 5.4* 5.2*  ALBUMIN 2.5* 2.8* 2.5* 2.5*   No results for input(s): LIPASE, AMYLASE in the last 168 hours. No results for input(s): AMMONIA in the last 168 hours. Coagulation Profile: Recent Labs  Lab 09/19/19 0430  INR 1.3*   Cardiac Enzymes: No results for input(s): CKTOTAL, CKMB, CKMBINDEX, TROPONINI in the last 168 hours. BNP (last 3 results) No results for input(s): PROBNP in the last 8760 hours. HbA1C: No results for input(s): HGBA1C in the last 72 hours. CBG: No results for  input(s): GLUCAP in the last 168 hours. Lipid Profile: No results for input(s): CHOL, HDL, LDLCALC, TRIG, CHOLHDL, LDLDIRECT in the last 72 hours. Thyroid Function Tests: No  results for input(s): TSH, T4TOTAL, FREET4, T3FREE, THYROIDAB in the last 72 hours. Anemia Panel: No results for input(s): VITAMINB12, FOLATE, FERRITIN, TIBC, IRON, RETICCTPCT in the last 72 hours. Sepsis Labs: No results for input(s): PROCALCITON, LATICACIDVEN in the last 168 hours.  Recent Results (from the past 240 hour(s))  Blood Culture (routine x 2)     Status: None   Collection Time: 09/10/19  8:20 PM   Specimen: BLOOD RIGHT FOREARM  Result Value Ref Range Status   Specimen Description   Final    BLOOD RIGHT FOREARM Performed at [facility], [place] [address], [place], [place] [address]    Special Requests   Final    BOTTLES DRAWN AEROBIC AND ANAEROBIC Blood Culture results may not be optimal due to an inadequate volume of blood received in culture bottles Performed at [place] [address] [place], [place] [address]    Culture   Final    NO GROWTH 5 DAYS Performed at [facility], [place] [address], [place], [place] [address]    Report Status 09/16/2019 FINAL  Final  Urine culture     Status: Abnormal   Collection Time: 09/10/19  8:20 PM   Specimen: Urine, Clean Catch  Result Value Ref Range Status   Specimen Description   Final    URINE, CLEAN CATCH Performed at [facility], [place] [address] [place], [place] [address]    Special Requests   Final    NONE Performed at [facility], [place] [address] [place], [place] [address]    Culture (A)  Final    <10,000 COLONIES/mL INSIGNIFICANT GROWTH Performed at [place] [address] [place], [place] [address]    Report Status 09/12/2019 FINAL  Final  Respiratory Panel by RT PCR (Flu A&B, Covid) - Nasopharyngeal Swab     Status: None   Collection Time: 09/10/19 10:18 PM   Specimen: Nasopharyngeal Swab  Result Value Ref Range Status   SARS Coronavirus 2 by RT PCR NEGATIVE NEGATIVE Final    Comment: (NOTE) SARS-CoV-2 target nucleic acids are  NOT DETECTED. The SARS-CoV-2 RNA is generally detectable in upper respiratoy specimens during the acute phase of infection. The lowest concentration of SARS-CoV-2 viral copies this assay can detect is 131 copies/mL. A negative result does not preclude SARS-Cov-2 infection and should not be used as the sole basis for treatment or other patient management decisions. A negative result may occur with  improper specimen collection/handling, submission of specimen other than nasopharyngeal swab, presence of viral mutation(s) within the areas targeted by this assay, and inadequate number of viral copies (<131 copies/mL). A negative result must be combined with clinical observations, patient history, and epidemiological information. The expected result is Negative. Fact Sheet for Patients:  PinkCheek.be Fact Sheet for Healthcare Providers:  GravelBags.it This test is not yet ap proved or cleared by the [place] FDA and  has been authorized for detection and/or diagnosis of SARS-CoV-2 by FDA under an Emergency Use Authorization (EUA). This EUA will remain  in effect (meaning this test can be used) for the duration of the COVID-19 declaration under Section 564(b)(1) of the Act, 21 U.S.C. section 360bbb-3(b)(1), unless the authorization is terminated or revoked sooner.    Influenza A by PCR NEGATIVE NEGATIVE Final  Influenza B by PCR NEGATIVE NEGATIVE Final    Comment: (NOTE) The Xpert Xpress SARS-CoV-2/FLU/RSV assay is intended as an aid in  the diagnosis of influenza from Nasopharyngeal swab specimens and  should not be used as a sole basis for treatment. Nasal washings and  aspirates are unacceptable for Xpert Xpress SARS-CoV-2/FLU/RSV  testing. Fact Sheet for Patients: PinkCheek.be Fact Sheet for Healthcare Providers: GravelBags.it This test is not yet approved or  cleared by the [place] FDA and  has been authorized for detection and/or diagnosis of SARS-CoV-2 by  FDA under an Emergency Use Authorization (EUA). This EUA will remain  in effect (meaning this test can be used) for the duration of the  Covid-19 declaration under Section 564(b)(1) of the Act, 21  U.S.C. section 360bbb-3(b)(1), unless the authorization is  terminated or revoked. Performed at [facility], [place] [address], [place], [place] [address]   MRSA PCR Screening     Status: None   Collection Time: 09/10/19 11:17 PM   Specimen: Nasal Mucosa; Nasopharyngeal  Result Value Ref Range Status   MRSA by PCR NEGATIVE NEGATIVE Final    Comment:        The GeneXpert MRSA Assay (FDA approved for NASAL specimens only), is one component of a comprehensive MRSA colonization surveillance program. It is not intended to diagnose MRSA infection nor to guide or monitor treatment for MRSA infections. Performed at [facility], [place] [address] [place], [place] [address]      Radiology Studies: IR GASTROSTOMY TUBE MOD SED  Result Date: 09/19/2019 INDICATION: Dysphagia. Please place percutaneous gastrostomy tube for enteric nutrition supplementation purposes. EXAM: PULL TROUGH GASTROSTOMY TUBE PLACEMENT COMPARISON:  CT abdomen and pelvis-04/21/2019 MEDICATIONS: The patient is currently amount to the hospital receiving intravenous antibiotics; Antibiotics were administered within 1 hour of the procedure. Glucagon 1 mg IV CONTRAST:  20 mL of Omnipaque 300 administered into the gastric lumen. ANESTHESIA/SEDATION: Moderate (conscious) sedation was employed during this procedure. A total of Versed 1 mg and Fentanyl 25 mcg was administered intravenously. Moderate Sedation Time: 33 minutes. The patient's level of consciousness and vital signs were monitored continuously by radiology nursing throughout the procedure under my direct supervision. FLUOROSCOPY TIME:  7  minutes, 6 seconds (412 mGy) COMPLICATIONS: None immediate. PROCEDURE: Informed written consent was obtained from the patient and the patient's daughter following explanation of the procedure, risks, benefits and alternatives. A time out was performed prior to the initiation of the procedure. Ultrasound scanning was performed to demarcate the edge of the left lobe of the liver. Maximal barrier sterile technique utilized including caps, mask, sterile gowns, sterile gloves, large sterile drape, hand hygiene and Betadine prep. The left upper quadrant was sterilely prepped and draped. An oral gastric catheter was inserted into the stomach under fluoroscopy. The existing nasogastric feeding tube was removed. The left costal margin and air opacified transverse colon were identified and avoided. Air was injected into the stomach for insufflation and visualization under fluoroscopy. Under sterile conditions a 17 gauge trocar needle was utilized to access the stomach percutaneously beneath the left subcostal margin after the overlying soft tissues were anesthetized with 1% Lidocaine with epinephrine. Needle position was confirmed within the stomach with aspiration of air and injection of small amount of contrast. A single T tack was deployed for gastropexy. Over an Amplatz guide wire, a 9-French sheath was inserted into the stomach. A snare device was utilized to capture the oral gastric catheter. The snare device was pulled  retrograde from the stomach up the esophagus and out the oropharynx. The 20-French pull-through gastrostomy was connected to the snare device and pulled antegrade through the oropharynx down the esophagus into the stomach and then through the percutaneous tract external to the patient. The gastrostomy was assembled externally. Contrast injection confirms position in the stomach. Several spot radiographic images were obtained in various obliquities for documentation. The patient tolerated procedure well  without immediate post procedural complication. FINDINGS: After successful fluoroscopic guided placement, the gastrostomy tube is appropriately positioned with internal disc against the ventral inner aspect of the gastric lumen. IMPRESSION: Successful fluoroscopic insertion of a 20-French pull-through gastrostomy tube. The gastrostomy may be used immediately for medication administration and in 24 hrs for the initiation of feeds. Electronically Signed   By: Sandi Mariscal M.D.   On: 09/19/2019 11:44    Scheduled Meds: . apixaban  5 mg Per Tube BID  . chlorhexidine  15 mL Mouth Rinse BID  . Chlorhexidine Gluconate Cloth  6 each Topical Q0600  . mouth rinse  15 mL Mouth Rinse q12n4p  . multivitamin with minerals  1 tablet Oral Daily  . simvastatin  10 mg Per Tube QHS  . traZODone  50 mg Per Tube QHS   Continuous Infusions: . ceFEPime (MAXIPIME) IV 2 g (09/20/19 1026)  . dextrose 100 mL/hr at 09/20/19 1025  . feeding supplement (OSMOLITE 1.2 CAL) 1,000 mL (09/20/19 1229)     LOS: 10 days    Time spent: 25 mins.    Shawna Clamp, MD [facility]   If 7PM-7AM, please contact night-coverage

## 2019-09-20 NOTE — Progress Notes (Signed)
Patient ID: Barry Swanson, male   DOB: Feb 28, 1929, 86 y.o.   MRN: [id number]   Diagnosis of Malnutrition was placed in error Please disregard  Thank you

## 2019-09-21 ENCOUNTER — Inpatient Hospital Stay (HOSPITAL_COMMUNITY): Payer: Medicare Other

## 2019-09-21 ENCOUNTER — Ambulatory Visit (HOSPITAL_COMMUNITY): Admission: RE | Admit: 2019-09-21 | Payer: Medicare Other | Source: Home / Self Care | Admitting: Gastroenterology

## 2019-09-21 ENCOUNTER — Encounter (HOSPITAL_COMMUNITY): Admission: RE | Payer: Self-pay | Source: Home / Self Care

## 2019-09-21 LAB — COMPREHENSIVE METABOLIC PANEL
ALT: 10 U/L (ref 0–44)
AST: 10 U/L — ABNORMAL LOW (ref 15–41)
Albumin: 2.6 g/dL — ABNORMAL LOW (ref 3.5–5.0)
Alkaline Phosphatase: 42 U/L (ref 38–126)
Anion gap: 8 (ref 5–15)
BUN: 24 mg/dL — ABNORMAL HIGH (ref 8–23)
CO2: 26 mmol/L (ref 22–32)
Calcium: 9.8 mg/dL (ref 8.9–10.3)
Chloride: 105 mmol/L (ref 98–111)
Creatinine, Ser: 1.38 mg/dL — ABNORMAL HIGH (ref 0.61–1.24)
GFR calc Af Amer: 53 mL/min — ABNORMAL LOW (ref >60)
GFR calc non Af Amer: 45 mL/min — ABNORMAL LOW (ref >60)
Glucose, Bld: 115 mg/dL — ABNORMAL HIGH (ref 70–99)
Potassium: 3.5 mmol/L (ref 3.5–5.1)
Sodium: 139 mmol/L (ref 135–145)
Total Bilirubin: 0.7 mg/dL (ref 0.3–1.2)
Total Protein: 5.2 g/dL — ABNORMAL LOW (ref 6.5–8.1)

## 2019-09-21 LAB — CBC
HCT: 39.1 % (ref 39.0–52.0)
Hemoglobin: 11.5 g/dL — ABNORMAL LOW (ref 13.0–17.0)
MCH: 27.1 pg (ref 26.0–34.0)
MCHC: 29.4 g/dL — ABNORMAL LOW (ref 30.0–36.0)
MCV: 92.2 fL (ref 80.0–100.0)
Platelets: 223 10*3/uL (ref 150–400)
RBC: 4.24 MIL/uL (ref 4.22–5.81)
RDW: 15.3 % (ref 11.5–15.5)
WBC: 5.9 10*3/uL (ref 4.0–10.5)
nRBC: 0 % (ref 0.0–0.2)

## 2019-09-21 LAB — PHOSPHORUS: Phosphorus: 3.2 mg/dL (ref 2.5–4.6)

## 2019-09-21 LAB — MAGNESIUM: Magnesium: 2 mg/dL (ref 1.7–2.4)

## 2019-09-21 SURGERY — ESOPHAGOGASTRODUODENOSCOPY (EGD) WITH PROPOFOL
Anesthesia: Monitor Anesthesia Care

## 2019-09-21 NOTE — Progress Notes (Signed)
OT Cancellation Note  Patient Details Name: Barry Swanson MRN: [id number] DOB: August 02, 1929   Cancelled Treatment:    Reason Eval/Treat Not Completed: Patient declined,  "whatever you're doing, I don't want any part of.  I don't need to do that (to all activities offered)  [facility] 09/21/2019, 10:55 AM  Karsten Ro, OTR/L Acute Rehabilitation Services 09/21/2019

## 2019-09-21 NOTE — Progress Notes (Signed)
PROGRESS NOTE    Barry Swanson  ALP:[id number] DOB: 04-04-1929 DOA: 09/10/2019 PCP: [name], MD   Brief Narrative:  Barry Swanson a 86 y.o.malewith medical history significant ofHTN, prior stroke, CKD stage 3. Admitted in Dec for AMS due to UTI, complicated by DVT. Discharged on eliquis which he has remained on. Pt presents to ED with 2 day history of worsening fever and SOB. Tm at home 103, no cough. No known COVID exposures. In the ED, temp 101.4, satting in the upper 80s on room air, improved to mid 90s on 3 L via [place]. D-dimer 0.53, WBC 10,000, procalcitonin 0.14, Covid POC and PCR negative. Chest x-ray shows right lower lobe infiltrate. He is continued on Antibiotics, Patient has high aspiration risk because of this chronic dysphagia. He underwent successful IR placed PEG tube, he was placed on heparin drip in anticipation for procedure.  He is started back on Eliquis post procedure, PEG tube feeding started , tolerating well, dosing will be increased.   Assessment & Plan:   Principal Problem:   Right lower lobe pneumonia Active Problems:   Essential hypertension   CKD (chronic kidney disease) stage 3, GFR 30-59 ml/min   Acute respiratory failure with hypoxia (HCC)   Fever   History of DVT (deep vein thrombosis)  1.  Sepsis/right lower lobe pneumonia / acute respiratory failure with hypoxia likely due to aspiration.   Patient presented with fever, tachycardia, hypoxic respiratory failure, elevated procalcitonin and lactic acid, and chest x-ray finding suggestive of right lower lobe pneumonia as a source. Patient currently on cefepime( days 9) and doxycycline( 6 days) Continue with oxygen supplementation Continue with bronchodilators and monitor inflammatory markers Obtain DG chest x1 view today.  Chest x-ray showed mild interval improvement in opacity of right mid and lower lung fields. Continue with antibiotics for a total of 10 days then de-escalate.  DC doxycycline ( day  6). Antibiotic discontinued after 10 days. He still is requiring supplemental oxygen at 6 L/min saturating 94%. We will try to taper this off as tolerated.  2.  Essential hypertension.  Blood pressure currently labile Continue to monitor and initiate antihypertensive medication.  3.  Chronic kidney disease stage IIIb.  Stable Continue to monitor renal function Avoid nephrotoxic agents  4.  History of chronic left lower extremity DVT.  On apixaban Continue with anticoagulation with heparin gtt while awaiting for PEG tube tomorrow. Resume Eliquis post procedure.  Okayed by IR  5.  Benign prostatic hyperplasia Continue with finasteride  6.  History of CVA Continue with aspirin and simvastatin  7.  Hyperlipidemia Continue with simvastatin  8.  Chronic dysphagia.  Due to Schatzki ring and esophageal dilation on 06/29/2019.  Continue to have significant trouble swallowing. Speech and swallow evaluated and recommended nectar thick liquid, thin liquid, teaspoon amount of Ensure, water and ice. Due to concern for continued aspiration with resultant right lower lobe pneumonia.  Patient was put on n.p.o. and pending reevaluation by speech and swallow. Patient is n.p.o. for now Continue with IV fluid D5/normal saline at 75 GI recommended outpatient esophageal dilatation. Family requested for modified barium swallow.  Will consult speech and swallow to evaluate for suitability. Patient may benefit from PEG tube placement Patient is status post modified barium swallow with no significant change in his dysphagia.  Speech and swallow recommended only liquid.  I discussed with patient's daughter on the need to consider PEG tube placement. I called in consult to IR and spoke with Dr. Pascal Lux.  Order has been put in for IR gastrostomy tube placement. IR plan for G-tube placement on Tuesday. Eliquis held today 09/16/2018 x2 days , transitioned to heparin gtt. Restart Eliquis after procedure on  Tuesday, 09/19/2019 Underwent successful PEG tube placement by IR, feeds resumed, tolerating well, dosing will be increased   9.  Chronic heart failure with preserved ejection fraction.  Last echocardiogram on 08/08/19 showed left ventricular ejection fraction, by visual estimation, is 55 to 60%. The left ventricle has normal function. There is mildly increased left ventricular hypertrophy.  Not in acute exacerbation Continue with aspirin, statin, beta-blocker Lasix as needed 2 g sodium diet  DVT prophylaxis:  Eliquis Code Status: DNR  Family Communication: None at bedside Disposition Plan:  Underwent successful PEG tube placement will resume feeds through the PEG tube Barriers to the discharge: We will see whether patient tolerates feeds through the PEG tube   Consultants:   IR   Procedures: PEG tube placement  Antimicrobials:  Anti-infectives (From admission, onward)   Start     Dose/Rate Route Frequency Ordered Stop   09/15/19 0800  doxycycline (VIBRAMYCIN) 100 mg in sodium chloride 0.9 % 250 mL IVPB  Status:  Discontinued     100 mg 125 mL/hr over 120 Minutes Intravenous Every 12 hours 09/15/19 0736 09/18/19 0836   09/14/19 2200  doxycycline (VIBRA-TABS) tablet 100 mg  Status:  Discontinued     100 mg Oral Every 12 hours 09/14/19 1001 09/15/19 0736   09/13/19 0900  doxycycline (VIBRAMYCIN) 100 mg in sodium chloride 0.9 % 250 mL IVPB  Status:  Discontinued     100 mg 125 mL/hr over 120 Minutes Intravenous Every 12 hours 09/13/19 0807 09/14/19 1001   09/12/19 2200  azithromycin (ZITHROMAX) tablet 500 mg  Status:  Discontinued     500 mg Oral Daily at bedtime 09/12/19 0830 09/13/19 0807   09/11/19 1000  cefTRIAXone (ROCEPHIN) 2 g in sodium chloride 0.9 % 100 mL IVPB  Status:  Discontinued     2 g 200 mL/hr over 30 Minutes Intravenous Every 24 hours 09/10/19 2316 09/11/19 0741   09/11/19 1000  ceFEPIme (MAXIPIME) 2 g in sodium chloride 0.9 % 100 mL IVPB     2 g 200 mL/hr  over 30 Minutes Intravenous Every 12 hours 09/11/19 0758 09/21/19 0807   09/10/19 2330  azithromycin (ZITHROMAX) 500 mg in sodium chloride 0.9 % 250 mL IVPB  Status:  Discontinued     500 mg 250 mL/hr over 60 Minutes Intravenous Daily at bedtime 09/10/19 2316 09/12/19 0830   09/10/19 2215  vancomycin (VANCOREADY) IVPB 1500 mg/300 mL     1,500 mg 150 mL/hr over 120 Minutes Intravenous  Once 09/10/19 2133 09/11/19 0257   09/10/19 2145  ceFEPIme (MAXIPIME) 2 g in sodium chloride 0.9 % 100 mL IVPB     2 g 200 mL/hr over 30 Minutes Intravenous  Once 09/10/19 2133 09/10/19 2343     Subjective: Patient was seen and examined at bedside.  He denies any abdominal pain.  No overnight events.  Patient is alert but has significant cognitive impairment.  He is requiring supplemental oxygen 6 L/min.  Saturating 94%.  Objective: Vitals:   09/20/19 1308 09/20/19 2105 09/21/19 0521 09/21/19 1313  BP: (!) 145/85 129/76 129/77 125/80  Pulse: 67 61 61 63  Resp: 18 16 18 17   Temp: 97.9 F (36.6 C) (!) 97.5 F (36.4 C) (!) 97.2 F (36.2 C) (!) 97.5 F (36.4 C)  TempSrc: Oral Oral  Oral   SpO2: 100% 97% 98% 100%  Weight:      Height:        Intake/Output Summary (Last 24 hours) at 09/21/2019 1540 Last data filed at 09/21/2019 0540 Gross per 24 hour  Intake 0 ml  Output 500 ml  Net -500 ml   Filed Weights   09/10/19 2033  Weight: 74.8 kg    Examination:  General exam: Appears calm and comfortable , on 6L O2 by nasal canula. Respiratory system: Clear to auscultation. Respiratory effort normal. Cardiovascular system: S1 & S2 heard, RRR. No JVD, murmurs, rubs, gallops or clicks. No pedal edema. Gastrointestinal system: Abdomen is nondistended, soft and nontender. PEG tube noted, no erythema around. Normal bowel sounds heard. Central nervous system: Alert and oriented. No focal neurological deficits. Extremities: No swelling no edema Skin: No rashes, lesions or ulcers   Data Reviewed: I have  personally reviewed following labs and imaging studies  CBC: Recent Labs  Lab 09/15/19 0500 09/15/19 0500 09/17/19 0446 09/18/19 0443 09/19/19 0430 09/20/19 0509 09/21/19 0439  WBC 7.4   < > 6.7 7.0 7.3 7.7 5.9  NEUTROABS 5.3  --   --   --   --   --   --   HGB 11.9*   < > 11.6* 11.4* 12.0* 11.8* 11.5*  HCT 39.7   < > 39.5 38.9* 40.8 39.4 39.1  MCV 93.9   < > 95.0 93.7 92.5 91.2 92.2  PLT 258   < > 266 249 252 248 223   < > = values in this interval not displayed.   Basic Metabolic Panel: Recent Labs  Lab 09/15/19 0500 09/16/19 0430 09/17/19 0446 09/18/19 0443 09/19/19 0430 09/20/19 0509 09/21/19 0439  NA 145   < > 147* 143 140 141 139  K 3.9   < > 3.8 3.3* 3.6 3.8 3.5  CL 112*   < > 114* 110 107 109 105  CO2 21*   < > 23 23 25 23 26   GLUCOSE 104*   < > 95 120* 102* 87 115*  BUN 21   < > 22 24* 25* 24* 24*  CREATININE 1.64*   < > 1.55* 1.36* 1.26* 1.52* 1.38*  CALCIUM 9.5   < > 9.4 9.0 9.6 9.5 9.8  MG 2.1  --  2.0 1.9  --   --  2.0  PHOS 3.0  --  2.9 2.5  --   --  3.2   < > = values in this interval not displayed.   GFR: Estimated Creatinine Clearance: 38.2 mL/min (A) (by C-G formula based on SCr of 1.38 mg/dL (H)). Liver Function Tests: Recent Labs  Lab 09/15/19 0500 09/17/19 0446 09/18/19 0443 09/21/19 0439  AST 8* 10* 11* 10*  ALT 11 8 8 10   ALKPHOS 51 39 39 42  BILITOT 1.5* 0.5 0.6 0.7  PROT 5.7* 5.4* 5.2* 5.2*  ALBUMIN 2.8* 2.5* 2.5* 2.6*   No results for input(s): LIPASE, AMYLASE in the last 168 hours. No results for input(s): AMMONIA in the last 168 hours. Coagulation Profile: Recent Labs  Lab 09/19/19 0430  INR 1.3*   Cardiac Enzymes: No results for input(s): CKTOTAL, CKMB, CKMBINDEX, TROPONINI in the last 168 hours. BNP (last 3 results) No results for input(s): PROBNP in the last 8760 hours. HbA1C: No results for input(s): HGBA1C in the last 72 hours. CBG: No results for input(s): GLUCAP in the last 168 hours. Lipid Profile: No  results for input(s): CHOL, HDL, LDLCALC,  TRIG, CHOLHDL, LDLDIRECT in the last 72 hours. Thyroid Function Tests: No results for input(s): TSH, T4TOTAL, FREET4, T3FREE, THYROIDAB in the last 72 hours. Anemia Panel: No results for input(s): VITAMINB12, FOLATE, FERRITIN, TIBC, IRON, RETICCTPCT in the last 72 hours. Sepsis Labs: No results for input(s): PROCALCITON, LATICACIDVEN in the last 168 hours.  No results found for this or any previous visit (from the past 240 hour(s)).   Radiology Studies: No results found.  Scheduled Meds: . apixaban  5 mg Per Tube BID  . chlorhexidine  15 mL Mouth Rinse BID  . Chlorhexidine Gluconate Cloth  6 each Topical Q0600  . mouth rinse  15 mL Mouth Rinse q12n4p  . multivitamin with minerals  1 tablet Oral Daily  . simvastatin  10 mg Per Tube QHS  . traZODone  50 mg Per Tube QHS   Continuous Infusions: . feeding supplement (OSMOLITE 1.2 CAL) 1,000 mL (09/21/19 1414)     LOS: 11 days    Time spent: 25 mins.    Shawna Clamp, MD [facility]   If 7PM-7AM, please contact night-coverage

## 2019-09-22 LAB — COMPREHENSIVE METABOLIC PANEL
ALT: 10 U/L (ref 0–44)
AST: 10 U/L — ABNORMAL LOW (ref 15–41)
Albumin: 2.6 g/dL — ABNORMAL LOW (ref 3.5–5.0)
Alkaline Phosphatase: 47 U/L (ref 38–126)
Anion gap: 7 (ref 5–15)
BUN: 25 mg/dL — ABNORMAL HIGH (ref 8–23)
CO2: 27 mmol/L (ref 22–32)
Calcium: 11 mg/dL — ABNORMAL HIGH (ref 8.9–10.3)
Chloride: 106 mmol/L (ref 98–111)
Creatinine, Ser: 1.36 mg/dL — ABNORMAL HIGH (ref 0.61–1.24)
GFR calc Af Amer: 53 mL/min — ABNORMAL LOW (ref >60)
GFR calc non Af Amer: 46 mL/min — ABNORMAL LOW (ref >60)
Glucose, Bld: 135 mg/dL — ABNORMAL HIGH (ref 70–99)
Potassium: 3.6 mmol/L (ref 3.5–5.1)
Sodium: 140 mmol/L (ref 135–145)
Total Bilirubin: 0.6 mg/dL (ref 0.3–1.2)
Total Protein: 5.4 g/dL — ABNORMAL LOW (ref 6.5–8.1)

## 2019-09-22 LAB — CBC
HCT: 38.8 % — ABNORMAL LOW (ref 39.0–52.0)
Hemoglobin: 11.8 g/dL — ABNORMAL LOW (ref 13.0–17.0)
MCH: 27.7 pg (ref 26.0–34.0)
MCHC: 30.4 g/dL (ref 30.0–36.0)
MCV: 91.1 fL (ref 80.0–100.0)
Platelets: 224 10*3/uL (ref 150–400)
RBC: 4.26 MIL/uL (ref 4.22–5.81)
RDW: 15.4 % (ref 11.5–15.5)
WBC: 7.3 10*3/uL (ref 4.0–10.5)
nRBC: 0 % (ref 0.0–0.2)

## 2019-09-22 LAB — MAGNESIUM: Magnesium: 2.4 mg/dL (ref 1.7–2.4)

## 2019-09-22 LAB — PHOSPHORUS: Phosphorus: 3.8 mg/dL (ref 2.5–4.6)

## 2019-09-22 NOTE — Progress Notes (Signed)
PROGRESS NOTE    Barry Swanson  JOA:[id number] DOB: 1928/08/27 DOA: 09/10/2019 PCP: [name], MD   Brief Narrative:  Barry Swanson a 86 y.o.malewith medical history significant ofHTN, prior stroke, CKD stage 3. Admitted in Dec for AMS due to UTI, complicated by DVT. Discharged on eliquis which he has remained on. Pt presents to ED with 2 day history of worsening fever and SOB. Tm at home 103, no cough. No known COVID exposures. In the ED, temp 101.4, satting in the upper 80s on room air, improved to mid 90s on 3 L via [place]. D-dimer 0.53, WBC 10,000, procalcitonin 0.14, Covid POC and PCR negative. Chest x-ray shows right lower lobe infiltrate. He is continued on Antibiotics, Patient has high aspiration risk because of this chronic dysphagia. He underwent successful IR placed PEG tube, he was placed on heparin drip in anticipation for procedure.  He is started back on Eliquis post procedure, PEG tube feeding started , tolerating well, dosing will be increased.   Assessment & Plan:   Principal Problem:   Right lower lobe pneumonia Active Problems:   Essential hypertension   CKD (chronic kidney disease) stage 3, GFR 30-59 ml/min   Acute respiratory failure with hypoxia (HCC)   Fever   History of DVT (deep vein thrombosis)  1.  Sepsis/right lower lobe pneumonia / acute respiratory failure with hypoxia likely due to aspiration.   Patient presented with fever, tachycardia, hypoxic respiratory failure, elevated procalcitonin and lactic acid, and chest x-ray finding suggestive of right lower lobe pneumonia as a source. Patient completed cefepime( days 10) and doxycycline( 6 days) Continue with oxygen supplementation to maintain 02 sat above 94% Continue with bronchodilators and monitor inflammatory markers Obtain DG chest x1 view today.  Chest x-ray showed mild interval improvement in opacity of right mid and lower lung fields. Continue with antibiotics for a total of 10 days then  de-escalate.  DC doxycycline ( day 6). Antibiotic discontinued after 10 days. He still is requiring supplemental oxygen at 5 L/min saturating 94%. We will try to taper this off as tolerated.  2.  Essential hypertension.  Blood pressure currently labile Continue to monitor and initiate antihypertensive medication.  3.  Chronic kidney disease stage IIIb.  Stable Continue to monitor renal function Avoid nephrotoxic agents  4.  History of chronic left lower extremity DVT.  On apixaban Continue with anticoagulation with heparin gtt while awaiting for PEG tube tomorrow. Resume Eliquis post procedure.  Okayed by IR  5.  Benign prostatic hyperplasia Continue with finasteride  6.  History of CVA Continue with aspirin and simvastatin  7.  Hyperlipidemia Continue with simvastatin  8.  Chronic dysphagia.  Due to Schatzki ring and esophageal dilation on 06/29/2019.  Continue to have significant trouble swallowing. Speech and swallow evaluated and recommended nectar thick liquid, thin liquid, teaspoon amount of Ensure, water and ice. Due to concern for continued aspiration with resultant right lower lobe pneumonia.  Patient was put on n.p.o. and pending reevaluation by speech and swallow. Patient is n.p.o. for now Continue with IV fluid D5/normal saline at 75 GI recommended outpatient esophageal dilatation. Family requested for modified barium swallow.  Will consult speech and swallow to evaluate for suitability. Patient may benefit from PEG tube placement Patient is status post modified barium swallow with no significant change in his dysphagia.  Speech and swallow recommended only liquid.  I discussed with patient's daughter on the need to consider PEG tube placement. I called in consult to IR and  spoke with Dr. Pascal Lux.  Order has been put in for IR gastrostomy tube placement. IR plan for G-tube placement on Tuesday. Eliquis held today 09/16/2018 x2 days , transitioned to heparin  gtt. Restart Eliquis after procedure on Tuesday, 09/19/2019 Underwent successful PEG tube placement by IR, feeds resumed, tolerating well, dosing will be increased   9.  Chronic heart failure with preserved ejection fraction.  Last echocardiogram on 08/08/19 showed left ventricular ejection fraction, by visual estimation, is 55 to 60%. The left ventricle has normal function. There is mildly increased left ventricular hypertrophy.  Not in acute exacerbation Continue with aspirin, statin, beta-blocker Lasix as needed 2 g sodium diet  DVT prophylaxis:  Eliquis Code Status: DNR  Family Communication: None at bedside Disposition Plan:  Underwent successful PEG tube placement , resume feeds through the PEG tube Barriers to the discharge:  Hypoxia requiring supplemental Oxygen 5 L /min, will try to wean him off as tolerated   Consultants:   IR   Procedures: PEG tube placement  Antimicrobials:  Anti-infectives (From admission, onward)   Start     Dose/Rate Route Frequency Ordered Stop   09/15/19 0800  doxycycline (VIBRAMYCIN) 100 mg in sodium chloride 0.9 % 250 mL IVPB  Status:  Discontinued     100 mg 125 mL/hr over 120 Minutes Intravenous Every 12 hours 09/15/19 0736 09/18/19 0836   09/14/19 2200  doxycycline (VIBRA-TABS) tablet 100 mg  Status:  Discontinued     100 mg Oral Every 12 hours 09/14/19 1001 09/15/19 0736   09/13/19 0900  doxycycline (VIBRAMYCIN) 100 mg in sodium chloride 0.9 % 250 mL IVPB  Status:  Discontinued     100 mg 125 mL/hr over 120 Minutes Intravenous Every 12 hours 09/13/19 0807 09/14/19 1001   09/12/19 2200  azithromycin (ZITHROMAX) tablet 500 mg  Status:  Discontinued     500 mg Oral Daily at bedtime 09/12/19 0830 09/13/19 0807   09/11/19 1000  cefTRIAXone (ROCEPHIN) 2 g in sodium chloride 0.9 % 100 mL IVPB  Status:  Discontinued     2 g 200 mL/hr over 30 Minutes Intravenous Every 24 hours 09/10/19 2316 09/11/19 0741   09/11/19 1000  ceFEPIme (MAXIPIME) 2 g  in sodium chloride 0.9 % 100 mL IVPB     2 g 200 mL/hr over 30 Minutes Intravenous Every 12 hours 09/11/19 0758 09/21/19 0807   09/10/19 2330  azithromycin (ZITHROMAX) 500 mg in sodium chloride 0.9 % 250 mL IVPB  Status:  Discontinued     500 mg 250 mL/hr over 60 Minutes Intravenous Daily at bedtime 09/10/19 2316 09/12/19 0830   09/10/19 2215  vancomycin (VANCOREADY) IVPB 1500 mg/300 mL     1,500 mg 150 mL/hr over 120 Minutes Intravenous  Once 09/10/19 2133 09/11/19 0257   09/10/19 2145  ceFEPIme (MAXIPIME) 2 g in sodium chloride 0.9 % 100 mL IVPB     2 g 200 mL/hr over 30 Minutes Intravenous  Once 09/10/19 2133 09/10/19 2343     Subjective: Patient was seen and examined at bedside.  He denies any abdominal pain.  No overnight events.  Patient is alert but has significant cognitive impairment.  He is requiring supplemental oxygen 5 L/min.  Saturating 94%.  Objective: Vitals:   09/21/19 1313 09/21/19 2103 09/22/19 0516 09/22/19 1323  BP: 125/80 137/77 130/81 135/78  Pulse: 63 66 61 (!) 57  Resp: 17 17 18 19   Temp: (!) 97.5 F (36.4 C) 97.8 F (36.6 C) (!) 97.3 F (36.3  C) 97.7 F (36.5 C)  TempSrc:  Oral Oral Oral  SpO2: 100% 97% 93% 94%  Weight:      Height:        Intake/Output Summary (Last 24 hours) at 09/22/2019 1351 Last data filed at 09/22/2019 0644 Gross per 24 hour  Intake 1209.5 ml  Output 775 ml  Net 434.5 ml   Filed Weights   09/10/19 2033  Weight: 74.8 kg    Examination:  General exam: Appears calm and comfortable , on 6L O2 by nasal canula. Respiratory system: Clear to auscultation. Respiratory effort normal. Cardiovascular system: S1 & S2 heard, RRR. No JVD, murmurs, rubs, gallops or clicks. No pedal edema. Gastrointestinal system: Abdomen is nondistended, soft and nontender. PEG tube noted, no erythema around. Normal bowel sounds heard. Central nervous system: Alert and oriented. No focal neurological deficits. Extremities: No swelling no edema Skin:  No rashes, lesions or ulcers   Data Reviewed: I have personally reviewed following labs and imaging studies  CBC: Recent Labs  Lab 09/18/19 0443 09/19/19 0430 09/20/19 0509 09/21/19 0439 09/22/19 0355  WBC 7.0 7.3 7.7 5.9 7.3  HGB 11.4* 12.0* 11.8* 11.5* 11.8*  HCT 38.9* 40.8 39.4 39.1 38.8*  MCV 93.7 92.5 91.2 92.2 91.1  PLT 249 252 248 223 324   Basic Metabolic Panel: Recent Labs  Lab 09/17/19 0446 09/17/19 0446 09/18/19 0443 09/19/19 0430 09/20/19 0509 09/21/19 0439 09/22/19 0355  NA 147*   < > 143 140 141 139 140  K 3.8   < > 3.3* 3.6 3.8 3.5 3.6  CL 114*   < > 110 107 109 105 106  CO2 23   < > 23 25 23 26 27   GLUCOSE 95   < > 120* 102* 87 115* 135*  BUN 22   < > 24* 25* 24* 24* 25*  CREATININE 1.55*   < > 1.36* 1.26* 1.52* 1.38* 1.36*  CALCIUM 9.4   < > 9.0 9.6 9.5 9.8 11.0*  MG 2.0  --  1.9  --   --  2.0 2.4  PHOS 2.9  --  2.5  --   --  3.2 3.8   < > = values in this interval not displayed.   GFR: Estimated Creatinine Clearance: 38.8 mL/min (A) (by C-G formula based on SCr of 1.36 mg/dL (H)). Liver Function Tests: Recent Labs  Lab 09/17/19 0446 09/18/19 0443 09/21/19 0439 09/22/19 0355  AST 10* 11* 10* 10*  ALT 8 8 10 10   ALKPHOS 39 39 42 47  BILITOT 0.5 0.6 0.7 0.6  PROT 5.4* 5.2* 5.2* 5.4*  ALBUMIN 2.5* 2.5* 2.6* 2.6*   No results for input(s): LIPASE, AMYLASE in the last 168 hours. No results for input(s): AMMONIA in the last 168 hours. Coagulation Profile: Recent Labs  Lab 09/19/19 0430  INR 1.3*   Cardiac Enzymes: No results for input(s): CKTOTAL, CKMB, CKMBINDEX, TROPONINI in the last 168 hours. BNP (last 3 results) No results for input(s): PROBNP in the last 8760 hours. HbA1C: No results for input(s): HGBA1C in the last 72 hours. CBG: No results for input(s): GLUCAP in the last 168 hours. Lipid Profile: No results for input(s): CHOL, HDL, LDLCALC, TRIG, CHOLHDL, LDLDIRECT in the last 72 hours. Thyroid Function Tests: No results  for input(s): TSH, T4TOTAL, FREET4, T3FREE, THYROIDAB in the last 72 hours. Anemia Panel: No results for input(s): VITAMINB12, FOLATE, FERRITIN, TIBC, IRON, RETICCTPCT in the last 72 hours. Sepsis Labs: No results for input(s): PROCALCITON, LATICACIDVEN in the  last 168 hours.  No results found for this or any previous visit (from the past 240 hour(s)).   Radiology Studies: DG CHEST PORT 1 VIEW  Result Date: 09/21/2019 CLINICAL DATA:  Shortness of breath. EXAM: PORTABLE CHEST 1 VIEW COMPARISON:  September 17, 2019 FINDINGS: Mild areas of atelectasis and/or infiltrate are seen within the bilateral lung bases. This is stable in appearance when compared to the prior study. There is no evidence of a pleural effusion or pneumothorax. The cardiac silhouette is mildly enlarged. There is mild calcification of the aortic arch. The visualized skeletal structures are unremarkable. IMPRESSION: 1. Mild, stable bibasilar atelectasis and/or infiltrate. Electronically Signed   By: Virgina Norfolk M.D.   On: 09/21/2019 16:24    Scheduled Meds: . apixaban  5 mg Per Tube BID  . chlorhexidine  15 mL Mouth Rinse BID  . Chlorhexidine Gluconate Cloth  6 each Topical Q0600  . mouth rinse  15 mL Mouth Rinse q12n4p  . multivitamin with minerals  1 tablet Oral Daily  . simvastatin  10 mg Per Tube QHS  . traZODone  50 mg Per Tube QHS   Continuous Infusions: . feeding supplement (OSMOLITE 1.2 CAL) 50 mL/hr at 09/22/19 0644     LOS: 12 days    Time spent: 25 mins.    Shawna Clamp, MD [facility]   If 7PM-7AM, please contact night-coverage

## 2019-09-22 NOTE — TOC Progression Note (Signed)
Transition of Care [facility]) - Progression Note    Patient Details  Name: Barry Swanson MRN: [id number] Date of Birth: 03-06-1929  Transition of Care [facility]) CM/SW Contact  Lynnley Doddridge, Juliann Pulse, RN Phone Number: 09/22/2019, 12:56 PM  Clinical Narrative:  PT recc SNF-spoke to dtr Caren Griffins agree to Fax out to SNF-await bed offers. Alvis Lemmings active still following rep [facility] aware. Noted PEG-TF-osmolite.      Expected Discharge Plan: Skilled Nursing Facility Barriers to Discharge: Continued Medical Work up  Expected Discharge Plan and Services Expected Discharge Plan: [facility]   Discharge Planning Services: CM Consult   Living arrangements for the past 2 months: Single Family Home                                       Social Determinants of Health (SDOH) Interventions    Readmission Risk Interventions Readmission Risk Prevention Plan 09/12/2019 05/03/2019  Transportation Screening Complete Complete  PCP or Specialist Appt within 3-5 Days Complete Complete  HRI or [place] Complete Complete  Social Work Consult for [place] Planning/Counseling Complete Complete  Palliative Care Screening Not Applicable Not Applicable  Medication Review [employer] photographer) Complete Complete  Some recent data might be hidden

## 2019-09-22 NOTE — NC FL2 (Signed)
[place] LEVEL OF CARE SCREENING TOOL     IDENTIFICATION  Patient Name: Barry Swanson Birthdate: 14-Nov-1928 Sex: male Admission Date (Current Location): 09/10/2019  [facility] and [place] Number:  Herbalist and Address:  [facility],  [place], [place]      Provider Number: [id number]  Attending Physician Name and Address:  Shawna Clamp, MD  Relative Name and Phone Number:  Harlan Stains dtr [place]: Hospital Recommended Level of Care: [place] Prior Approval Number:    Date Approved/Denied:   PASRR Number: [id number]  Discharge Plan: SNF    Current Diagnoses: Patient Active Problem List   Diagnosis Date Noted  . Right lower lobe pneumonia 09/10/2019  . Acute respiratory failure with hypoxia ([place]) 09/10/2019  . Fever 09/10/2019  . History of DVT (deep vein thrombosis) 09/10/2019  . Acute DVT (deep venous thrombosis) ([place]) 08/14/2019  . Hematuria 08/14/2019  . CKD (chronic kidney disease) stage 3, GFR 30-59 ml/min 08/14/2019  . Dysphagia 08/14/2019  . AMS (altered mental status) 08/08/2019  . Bladder stone 06/01/2019  . Sepsis ([place]) 04/30/2019  . Acute pyelonephritis 04/30/2019  . Acute kidney injury ([place]) 04/30/2019  . Confusion   . Essential hypertension   . Acute encephalopathy 02/11/2015  . TIA (transient ischemic attack) 02/11/2015  . Hypertension   . Hyperlipidemia   . Calculus (=stone) 11/29/2012  . Malignant neoplasm of prostate ([place]) 11/29/2012  . Bladder outflow obstruction 11/29/2012    Orientation RESPIRATION BLADDER Height & Weight     Self, Time, Situation  O2(02 2lnc continuous) External catheter Weight: 74.8 kg Height:  5\' 10"  (177.8 cm)  BEHAVIORAL SYMPTOMS/MOOD NEUROLOGICAL BOWEL NUTRITION STATUS      Incontinent Feeding tube(PEG-Osmolite;NPO except meds)  AMBULATORY STATUS COMMUNICATION OF NEEDS Skin   Limited Assist Verbally Normal                        Personal Care Assistance Level of Assistance  Bathing, Feeding, Dressing Bathing Assistance: Limited assistance Feeding assistance: Limited assistance Dressing Assistance: Limited assistance     Functional Limitations Info  Sight, Hearing, Speech Sight Info: Impaired(eyeglasses) Hearing Info: Adequate Speech Info: Adequate    SPECIAL CARE FACTORS FREQUENCY  PT (By licensed PT), OT (By licensed OT)     PT Frequency: 5x week OT Frequency: 5x week            Contractures Contractures Info: Not present    Additional Factors Info  Code Status, Allergies(osmolite @60cc /hr) Code Status Info: DNR Allergies Info: NKA           Current Medications (09/22/2019):  This is the current hospital active medication list Current Facility-Administered Medications  Medication Dose Route Frequency Provider Last Rate Last Admin  . apixaban (ELIQUIS) tablet 5 mg  5 mg Per Tube BID Gardiner Barefoot, NP   5 mg at 09/21/19 2113  . chlorhexidine (PERIDEX) 0.12 % solution 15 mL  15 mL Mouth Rinse BID Elie Confer, MD   15 mL at 09/21/19 2118  . Chlorhexidine Gluconate Cloth 2 % PADS 6 each  6 each Topical Q0600 Shawna Clamp, MD   6 each at 09/22/19 0600  . feeding supplement (OSMOLITE 1.2 CAL) liquid 1,000 mL  1,000 mL Per Tube Continuous Shawna Clamp, MD 50 mL/hr at 09/22/19 0644 Rate Change at 09/22/19 0644  . gabapentin (NEURONTIN) capsule 300 mg  300 mg  Oral Daily PRN Etta Quill, DO   300 mg at 09/19/19 1758  . MEDLINE mouth rinse  15 mL Mouth Rinse q12n4p Elie Confer, MD   15 mL at 09/21/19 1646  . multivitamin with minerals tablet 1 tablet  1 tablet Oral Daily Alma Friendly, MD   1 tablet at 09/21/19 1112  . OLANZapine (ZYPREXA) tablet 5 mg  5 mg Per Tube BID PRN Gardiner Barefoot, NP      . simvastatin (ZOCOR) tablet 10 mg  10 mg Per Tube QHS Gardiner Barefoot, NP   10 mg at 09/21/19 2113  . traZODone (DESYREL) tablet 50 mg   50 mg Per Tube QHS Gardiner Barefoot, NP   50 mg at 09/21/19 2113     Discharge Medications: Please see discharge summary for a list of discharge medications.  Relevant Imaging Results:  Relevant Lab Results:   Additional Information ss#[id number]  Dessa Phi, [place]

## 2019-09-22 NOTE — Progress Notes (Signed)
OT Cancellation Note  Patient Details Name: Barry Swanson MRN: [id number] DOB: 1929-02-28   Cancelled Treatment:     Attempt to see patient this AM, patient is confused making statements about having a fun time last night and his dog going to the vet today. Attempt to redirect multiple times, patient becomes emotional remember his spouse who he states passed away. Try to redirect patient however he perseverates. Will re-attempt as time permits.   [place] OT office: [place] 09/22/2019, 11:13 AM

## 2019-09-22 NOTE — Progress Notes (Signed)
When RN was rounding, patient, who was sitting in chair with chair alarm on, began to tell the RN that he needed to go home.  RN explained that he is in the hospital and is not able to go home until the MD decides the patient is well enough.  Patient continued trying to get out of the chair. RN continued to try to help patient understand he is being cared for because he is sick and that he needs to either stay in the chair for a while longer or get back into his bed.   RN requested help of NT.  Both RN & NT tried to reorient patient but patient became combative, tried to swat at both the RN and NT and cursed the RN and NT.  RN attempted to call daughter in hopes she could help reorient patient.  RN did not reach daughter but left message.    Patient eventually settled enough to remain in the chair with the chair alarm on.

## 2019-09-22 NOTE — Progress Notes (Signed)
Physical Therapy Treatment Patient Details Name: Barry Swanson MRN: [id number] DOB: 08-Jul-1929 Today's Date: 09/22/2019    History of Present Illness Barry Swanson is a 86 y.o. male with medical history significant of HTN, prior stroke, CKD stage 3.  Pt with recent admission in December for DVT.  Pt admitted with PNA.  Perfusion scan negative for PE.  Pt s/p PEG placement 09/19/19    PT Comments    Pt agreeable for OOB to recliner.  Pt slow to mobilize but attempting to assist as able.  Pt remained on 1.5 L O2 [place] and Spo2 remained 93% during session.   Follow Up Recommendations  SNF;Supervision/Assistance - 24 hour     Equipment Recommendations  None recommended by PT    Recommendations for Other Services       Precautions / Restrictions Precautions Precautions: Fall Precaution Comments: PEG with abdominal binder, currently on 1.5L O2 [place]    Mobility  Bed Mobility Overal bed mobility: Needs Assistance Bed Mobility: Rolling;Sidelying to Sit Rolling: Min guard Sidelying to sit: Mod assist       General bed mobility comments: verbal cues for log roll technique (PEG tube, wearing abdominal binder), assist for trunk upright  Transfers Overall transfer level: Needs assistance Equipment used: Rolling walker (2 wheeled) Transfers: Sit to/from [facility] Sit to Stand: Min assist Stand pivot transfers: Min assist       General transfer comment: assist for rise and steady, assist to move RW, visual and verbal cues for hand placement, pt tends to reach for armrests instead of holding RW, also increased trunk flexion with standing and transfer, SPO2 93% on 1.5L pre and post transfer (RN aware)  Ambulation/Gait                 Stairs             Wheelchair Mobility    Modified Rankin (Stroke Patients Only)       Balance Overall balance assessment: Needs assistance Sitting-balance support: Feet supported;Bilateral upper extremity  supported Sitting balance-Leahy Scale: Poor Sitting balance - Comments: requiring UE support today   Standing balance support: Bilateral upper extremity supported;During functional activity Standing balance-Leahy Scale: Poor Standing balance comment: reliant on UE support and external assist                            Cognition Arousal/Alertness: Awake/alert Behavior During Therapy: WFL for tasks assessed/performed Overall Cognitive Status: No family/caregiver present to determine baseline cognitive functioning                                 General Comments: pleasant, does not appear aware of medical conditions, agreeable to attempt mobility as able      Exercises      General Comments        Pertinent Vitals/Pain Pain Assessment: Faces Faces Pain Scale: Hurts little more Pain Location: PEG site Pain Descriptors / Indicators: Grimacing;Discomfort Pain Intervention(s): Repositioned;Monitored during session    Home Living                      Prior Function            PT Goals (current goals can now be found in the care plan section) Progress towards PT goals: Progressing toward goals    Frequency    Min 3X/week  PT Plan Current plan remains appropriate    Co-evaluation              AM-PAC PT "6 Clicks" Mobility   Outcome Measure  Help needed turning from your back to your side while in a flat bed without using bedrails?: A Little Help needed moving from lying on your back to sitting on the side of a flat bed without using bedrails?: A Little Help needed moving to and from a bed to a chair (including a wheelchair)?: A Lot Help needed standing up from a chair using your arms (e.g., wheelchair or bedside chair)?: A Lot Help needed to walk in hospital room?: A Lot Help needed climbing 3-5 steps with a railing? : Total 6 Click Score: 13    End of Session Equipment Utilized During Treatment: Oxygen;Gait  belt Activity Tolerance: Patient limited by fatigue Patient left: with call bell/phone within reach;in chair;with chair alarm set Nurse Communication: Mobility status PT Visit Diagnosis: Other abnormalities of gait and mobility (R26.89);Muscle weakness (generalized) (M62.81)     Time: 8833-7445 PT Time Calculation (min) (ACUTE ONLY): 20 min  Charges:  $Therapeutic Activity: 8-22 mins                     Arlyce Dice, DPT Acute Rehabilitation Services Office: [phone]     York Ram E 09/22/2019, 2:55 PM

## 2019-09-22 NOTE — Progress Notes (Signed)
SLP Cancellation Note  Patient Details Name: Barry Swanson MRN: [id number] DOB: 04-28-1929   Cancelled treatment:       Reason Eval/Treat Not Completed: Other (comment)(no family present to educate, will continue efforts)  Kathleen Lime, MS [facility] SLP Acute Rehab Services Office [phone]  [name] 09/22/2019, 2:34 PM

## 2019-09-23 DIAGNOSIS — J690 Pneumonitis due to inhalation of food and vomit: Secondary | ICD-10-CM

## 2019-09-23 LAB — CBC
HCT: 41.5 % (ref 39.0–52.0)
Hemoglobin: 12.5 g/dL — ABNORMAL LOW (ref 13.0–17.0)
MCH: 27.2 pg (ref 26.0–34.0)
MCHC: 30.1 g/dL (ref 30.0–36.0)
MCV: 90.2 fL (ref 80.0–100.0)
Platelets: 234 10*3/uL (ref 150–400)
RBC: 4.6 MIL/uL (ref 4.22–5.81)
RDW: 15.6 % — ABNORMAL HIGH (ref 11.5–15.5)
WBC: 9 10*3/uL (ref 4.0–10.5)
nRBC: 0 % (ref 0.0–0.2)

## 2019-09-23 LAB — URINALYSIS, ROUTINE W REFLEX MICROSCOPIC
Bilirubin Urine: NEGATIVE
Glucose, UA: NEGATIVE mg/dL
Ketones, ur: NEGATIVE mg/dL
Nitrite: NEGATIVE
Protein, ur: 100 mg/dL — AB
RBC / HPF: >50 RBC/hpf — ABNORMAL HIGH (ref 0–5)
Specific Gravity, Urine: 1.011 (ref 1.005–1.030)
WBC, UA: >50 WBC/hpf — ABNORMAL HIGH (ref 0–5)
pH: 5 (ref 5.0–8.0)

## 2019-09-23 LAB — BASIC METABOLIC PANEL
Anion gap: 11 (ref 5–15)
BUN: 24 mg/dL — ABNORMAL HIGH (ref 8–23)
CO2: 30 mmol/L (ref 22–32)
Calcium: 11 mg/dL — ABNORMAL HIGH (ref 8.9–10.3)
Chloride: 100 mmol/L (ref 98–111)
Creatinine, Ser: 1.43 mg/dL — ABNORMAL HIGH (ref 0.61–1.24)
GFR calc Af Amer: 50 mL/min — ABNORMAL LOW (ref >60)
GFR calc non Af Amer: 43 mL/min — ABNORMAL LOW (ref >60)
Glucose, Bld: 127 mg/dL — ABNORMAL HIGH (ref 70–99)
Potassium: 3.7 mmol/L (ref 3.5–5.1)
Sodium: 141 mmol/L (ref 135–145)

## 2019-09-23 LAB — MAGNESIUM: Magnesium: 2.3 mg/dL (ref 1.7–2.4)

## 2019-09-23 LAB — PHOSPHORUS: Phosphorus: 4.2 mg/dL (ref 2.5–4.6)

## 2019-09-23 MED ORDER — SODIUM CHLORIDE 0.9 % IV SOLN
2.0000 g | Freq: Two times a day (BID) | INTRAVENOUS | Status: DC
  Administered 2019-09-24 – 2019-09-26 (×5): 2 g via INTRAVENOUS
  Filled 2019-09-23 (×6): qty 2

## 2019-09-23 MED ORDER — SODIUM CHLORIDE 0.9 % IV SOLN
1.0000 g | Freq: Once | INTRAVENOUS | Status: AC
  Administered 2019-09-24: 1 g via INTRAVENOUS
  Filled 2019-09-23: qty 1

## 2019-09-23 MED ORDER — SODIUM CHLORIDE 0.9 % IV SOLN
1.0000 g | Freq: Two times a day (BID) | INTRAVENOUS | Status: DC
  Administered 2019-09-23: 1 g via INTRAVENOUS
  Filled 2019-09-23: qty 1

## 2019-09-23 NOTE — Progress Notes (Signed)
PHARMACY NOTE:  ANTIMICROBIAL RENAL DOSAGE ADJUSTMENT  Current antimicrobial regimen includes a mismatch between antimicrobial dosage and estimated renal function.  As per policy approved by the Pharmacy & Therapeutics and Medical Executive Committees, the antimicrobial dosage will be adjusted accordingly.  Current antimicrobial dosage:  Cefepime 1gm IV q12h  Indication: PNA  Renal Function:  Estimated Creatinine Clearance: 36.9 mL/min (A) (by C-G formula based on SCr of 1.43 mg/dL (H)). []      On intermittent HD, scheduled: []      On CRRT    Antimicrobial dosage has been changed to:  Cefepime 2gm IV q12h  Thank you for allowing pharmacy to be a part of this patient's care.  Lynelle Doctor, [facility] 09/23/2019 6:26 PM

## 2019-09-23 NOTE — Progress Notes (Signed)
Upon RN assessment, pt was found in urine and refused to be cleaned up. Pt only alert to time and place at this time. Will try again later. RN will continue to monitor.

## 2019-09-23 NOTE — Progress Notes (Addendum)
PROGRESS NOTE  Barry Swanson LJQ:[id number] DOB: 05/25/1929 DOA: 09/10/2019 PCP: Barry Infante, MD  HPI/Recap of past 24 hours: Per QRF:XJOITG Barry Swanson a 86 y.o.malewith medical history significant ofHTN, prior stroke, CKD stage 3. Admitted in Dec for AMS due to UTI, complicated by DVT. Discharged on eliquis which he has remained on. Pt presents to ED with 2 day history of worsening fever and SOB. Tm at home 103, no cough. No known COVID exposures. In the ED, temp 101.4, satting in the upper 80s on room air, improved to mid 90s on 3 L via . D-dimer 0.53, WBC 10,000, procalcitonin 0.14, Covid POC and PCR negative. Chest x-ray shows right lower lobe infiltrate. He is continued on Antibiotics, Patient has high aspiration risk because of this chronic dysphagia. He underwent successful IR placed PEG tube, he was placed on heparin drip in anticipation for procedure.  He is started back on Eliquis post procedure, PEG tube feeding started , tolerating well, dosing will be increased.   09/23/2019 Subjective Patient seen and examined at bedside  Assessment/Plan: Principal Problem:   Right lower lobe pneumonia Active Problems:   Essential hypertension   CKD (chronic kidney disease) stage 3, GFR 30-59 ml/min   Acute respiratory failure with hypoxia (HCC)   Fever   History of DVT (deep vein thrombosis)    1. Sepsis/right lower lobe pneumonia / acute respiratory failure with hypoxia likely due to aspiration.   Patient has completed 10 days of cefepime and 6 days of doxycycline he was supposed to complete 10 days of doxycycline.  Repeat chest x-ray done yesterday showed multiple mild interval improvement in the opacity of the right middle and lower lobe field this morning he was noted to have desaturation on room air to about 88 and maintaining at 94 on 2 L/min attempting to wean him off failed   2. Essential hypertension.  Blood pressure is currently stable we will continue  antihypertensive.   3. Chronic kidney disease stage IIIb.  We will continue to monitor renal function and avoiding nephrotoxic medication   4. History of chronic left lower extremity DVT.  Patient is on apixaban.  On apixaban  5. Benign prostatic hyperplasia  Continue finasteride  6. History of CVA  Continue aspirin and simvastatin  7. Hyperlipidemia Continue simvastatin    8. Chronic dysphagia.   Due to [facility] ring and esophageal dilatation on June 29, 2019 with esophageal dilated Barry Swanson but he continues to have significant swallowing trouble.  Speech therapy therapy consult was obtained and he underwent modified barium swallow they recommended nectar thick liquid but due to concern for continued aspiration with resultant right lower lobe pneumonia successfully underwent PEG tube placement on September 19, 2019.  GI recommended for outpatient esophageal dilatation.   9. Chronic heart failure with preserved ejection fraction.  Patient is not in acute exacerbation the left ventricle are functioning well his last echocardiogram on August 08, 2019 showed ejection fraction of 55 to 60% we will continue aspirin statin and beta-blocker and Lasix as needed we will continue to limit his salt intake to 2 g.   Code Status: DNR  Severity of Illness: The appropriate patient status for this patient is INPATIENT. Inpatient status is judged to be reasonable and necessary in order to provide the required intensity of service to ensure the patient's safety. The patient's presenting symptoms, physical exam findings, and initial radiographic and laboratory data in the context of their chronic comorbidities is felt to place them at high risk for  further clinical deterioration. Furthermore, it is not anticipated that the patient will be medically stable for discharge from the hospital within 2 midnights of admission. The following factors support the patient status of inpatient.    " Hypoxia waiting for SNF * I certify that at the point of admission it is my clinical judgment that the patient will require inpatient hospital care spanning beyond 2 midnights from the point of admission due to high intensity of service, high risk for further deterioration and high frequency of surveillance required.*    Family Communication: Spoke with his daughter Barry Swanson and updated she stated she is still trying to look for the appropriate nursing facility.  Disposition Plan: PT recc SNF-spoke to dtr Barry Swanson agree to Fax out to SNF-await bed offers.  88% on RA; 92% on 0.5    Consultants:  None  Procedures:  PEG tube placement  Antimicrobials:  Doxycycline  Cefepime  DVT prophylaxis: Eliquis   Objective: Vitals:   09/22/19 0516 09/22/19 1323 09/22/19 2051 09/23/19 0527  BP: 130/81 135/78 (!) 143/82 (!) 143/74  Pulse: 61 (!) 57 72 (!) 57  Resp: 18 19 18 18   Temp: (!) 97.3 F (36.3 C) 97.7 F (36.5 C) 97.6 F (36.4 C) 97.7 F (36.5 C)  TempSrc: Oral Oral Oral Oral  SpO2: 93% 94% 94% 97%  Weight:      Height:        Intake/Output Summary (Last 24 hours) at 09/23/2019 1005 Last data filed at 09/23/2019 2376 Gross per 24 hour  Intake 805.83 ml  Output 1375 ml  Net -569.17 ml   Filed Weights   09/10/19 2033  Weight: 74.8 kg   Body mass index is 23.68 kg/m.  Exam:  . General: 86 y.o. year-old male well developed well nourished in no acute distress.  Alert and oriented x3. . Cardiovascular: Regular rate and rhythm with no rubs or gallops.  No thyromegaly or JVD noted.   Marland Kitchen Respiratory: Clear to auscultation with no wheezes or rales. Good inspiratory effort. . Abdomen: Soft nontender nondistended with normal bowel sounds x4 quadrants. . Musculoskeletal: No lower extremity edema. 2/4 pulses in all 4 extremities. . Skin: No ulcerative lesions noted or rashes, . Psychiatry: Mood is appropriate for condition and setting    Data Reviewed: CBC: Recent  Labs  Lab 09/19/19 0430 09/20/19 0509 09/21/19 0439 09/22/19 0355 09/23/19 0520  WBC 7.3 7.7 5.9 7.3 9.0  HGB 12.0* 11.8* 11.5* 11.8* 12.5*  HCT 40.8 39.4 39.1 38.8* 41.5  MCV 92.5 91.2 92.2 91.1 90.2  PLT 252 248 223 224 283   Basic Metabolic Panel: Recent Labs  Lab 09/17/19 0446 09/17/19 0446 09/18/19 0443 09/18/19 0443 09/19/19 0430 09/20/19 0509 09/21/19 0439 09/22/19 0355 09/23/19 0520  NA 147*   < > 143   < > 140 141 139 140 141  K 3.8   < > 3.3*   < > 3.6 3.8 3.5 3.6 3.7  CL 114*   < > 110   < > 107 109 105 106 100  CO2 23   < > 23   < > 25 23 26 27 30   GLUCOSE 95   < > 120*   < > 102* 87 115* 135* 127*  BUN 22   < > 24*   < > 25* 24* 24* 25* 24*  CREATININE 1.55*   < > 1.36*   < > 1.26* 1.52* 1.38* 1.36* 1.43*  CALCIUM 9.4   < > 9.0   < >  9.6 9.5 9.8 11.0* 11.0*  MG 2.0  --  1.9  --   --   --  2.0 2.4 2.3  PHOS 2.9  --  2.5  --   --   --  3.2 3.8 4.2   < > = values in this interval not displayed.   GFR: Estimated Creatinine Clearance: 36.9 mL/min (A) (by C-G formula based on SCr of 1.43 mg/dL (H)). Liver Function Tests: Recent Labs  Lab 09/17/19 0446 09/18/19 0443 09/21/19 0439 09/22/19 0355  AST 10* 11* 10* 10*  ALT 8 8 10 10   ALKPHOS 39 39 42 47  BILITOT 0.5 0.6 0.7 0.6  PROT 5.4* 5.2* 5.2* 5.4*  ALBUMIN 2.5* 2.5* 2.6* 2.6*   No results for input(s): LIPASE, AMYLASE in the last 168 hours. No results for input(s): AMMONIA in the last 168 hours. Coagulation Profile: Recent Labs  Lab 09/19/19 0430  INR 1.3*   Cardiac Enzymes: No results for input(s): CKTOTAL, CKMB, CKMBINDEX, TROPONINI in the last 168 hours. BNP (last 3 results) No results for input(s): PROBNP in the last 8760 hours. HbA1C: No results for input(s): HGBA1C in the last 72 hours. CBG: No results for input(s): GLUCAP in the last 168 hours. Lipid Profile: No results for input(s): CHOL, HDL, LDLCALC, TRIG, CHOLHDL, LDLDIRECT in the last 72 hours. Thyroid Function Tests: No  results for input(s): TSH, T4TOTAL, FREET4, T3FREE, THYROIDAB in the last 72 hours. Anemia Panel: No results for input(s): VITAMINB12, FOLATE, FERRITIN, TIBC, IRON, RETICCTPCT in the last 72 hours. Urine analysis:    Component Value Date/Time   COLORURINE YELLOW 09/10/2019 2020   APPEARANCEUR CLOUDY (A) 09/10/2019 2020   LABSPEC 1.015 09/10/2019 2020   PHURINE 5.0 09/10/2019 2020   GLUCOSEU NEGATIVE 09/10/2019 2020   HGBUR MODERATE (A) 09/10/2019 2020   BILIRUBINUR NEGATIVE 09/10/2019 2020   [place] 09/10/2019 2020   PROTEINUR 100 (A) 09/10/2019 2020   UROBILINOGEN 1.0 02/28/2015 1550   NITRITE NEGATIVE 09/10/2019 2020   LEUKOCYTESUR LARGE (A) 09/10/2019 2020   Sepsis Labs: @LABRCNTIP (procalcitonin:4,lacticidven:4)  )No results found for this or any previous visit (from the past 240 hour(s)).    Studies: No results found.  Scheduled Meds: . apixaban  5 mg Per Tube BID  . chlorhexidine  15 mL Mouth Rinse BID  . Chlorhexidine Gluconate Cloth  6 each Topical Q0600  . mouth rinse  15 mL Mouth Rinse q12n4p  . multivitamin with minerals  1 tablet Oral Daily  . simvastatin  10 mg Per Tube QHS  . traZODone  50 mg Per Tube QHS    Continuous Infusions: . feeding supplement (OSMOLITE 1.2 CAL) 60 mL/hr at 09/22/19 2200     LOS: 13 days     Cristal Deer, MD [facility]  To reach me or the doctor on call, go to: [email or web address] Password [email or web address]  09/23/2019, 10:05 AM

## 2019-09-24 DIAGNOSIS — Z515 Encounter for palliative care: Secondary | ICD-10-CM

## 2019-09-24 DIAGNOSIS — Z7189 Other specified counseling: Secondary | ICD-10-CM

## 2019-09-24 DIAGNOSIS — R1310 Dysphagia, unspecified: Secondary | ICD-10-CM

## 2019-09-24 LAB — CBC
HCT: 42 % (ref 39.0–52.0)
Hemoglobin: 12.3 g/dL — ABNORMAL LOW (ref 13.0–17.0)
MCH: 27.3 pg (ref 26.0–34.0)
MCHC: 29.3 g/dL — ABNORMAL LOW (ref 30.0–36.0)
MCV: 93.3 fL (ref 80.0–100.0)
Platelets: 278 10*3/uL (ref 150–400)
RBC: 4.5 MIL/uL (ref 4.22–5.81)
RDW: 15.9 % — ABNORMAL HIGH (ref 11.5–15.5)
WBC: 11.6 10*3/uL — ABNORMAL HIGH (ref 4.0–10.5)
nRBC: 0 % (ref 0.0–0.2)

## 2019-09-24 MED ORDER — ADULT MULTIVITAMIN W/MINERALS CH
1.0000 | ORAL_TABLET | Freq: Every day | ORAL | Status: DC
  Administered 2019-09-24 – 2019-09-25 (×2): 1
  Filled 2019-09-24 (×2): qty 1

## 2019-09-24 MED ORDER — MORPHINE SULFATE (PF) 2 MG/ML IV SOLN
0.5000 mg | INTRAVENOUS | Status: DC | PRN
  Administered 2019-09-26: 04:00:00 0.5 mg via INTRAVENOUS
  Filled 2019-09-24: qty 1

## 2019-09-24 NOTE — Progress Notes (Signed)
Daily Progress Note   Patient Name: Barry Swanson       Date: 09/24/2019 DOB: 1929/04/26  Age: 86 y.o. MRN#: [id number] Attending Physician: Cristal Deer, MD Primary Care Physician: [name], MD Admit Date: 09/10/2019  Reason for Consultation/Follow-up: Establishing goals of care  Subjective:  patient appears to be in mild distress, he complains of feeling cold, he furrows his brows. His daughter Caren Griffins is at the bedside. Discussed with TRH MD as well as with daughter at bedside, see below.   Length of Stay: 14  Current Medications: Scheduled Meds:  . apixaban  5 mg Per Tube BID  . chlorhexidine  15 mL Mouth Rinse BID  . Chlorhexidine Gluconate Cloth  6 each Topical Q0600  . mouth rinse  15 mL Mouth Rinse q12n4p  . multivitamin with minerals  1 tablet Per Tube Daily  . simvastatin  10 mg Per Tube QHS  . traZODone  50 mg Per Tube QHS    Continuous Infusions: . ceFEPime (MAXIPIME) IV Stopped (09/24/19 3818)  . feeding supplement (OSMOLITE 1.2 CAL) 1,000 mL (09/24/19 0600)    PRN Meds: gabapentin, morphine injection, OLANZapine  Physical Exam         Mild distress Has PEG Is on tube feeds via PEG No edema Awake not entirely alert Regular work of breathing S1 S2  Vital Signs: BP 133/76 (BP Location: Left Arm)   Pulse (!) 59   Temp 97.8 F (36.6 C) (Oral)   Resp 20   Ht 5\' 10"  (1.778 m)   Wt 74.8 kg   SpO2 100%   BMI 23.68 kg/m  SpO2: SpO2: 100 % O2 Device: O2 Device: Nasal Cannula O2 Flow Rate: O2 Flow Rate (L/min): 2 L/min  Intake/output summary:   Intake/Output Summary (Last 24 hours) at 09/24/2019 1414 Last data filed at 09/24/2019 1000 Gross per 24 hour  Intake 2390.07 ml  Output 900 ml  Net 1490.07 ml   LBM: Last BM Date: 09/22/19 Baseline Weight:  Weight: 74.8 kg Most recent weight: Weight: 74.8 kg       Palliative Assessment/Data:    Flowsheet Rows     Most Recent Value  Intake Tab  Referral Department  Hospitalist  Unit at Time of Referral  -- [urology/telementry]  Palliative Care Primary Diagnosis  Pulmonary  Date Notified  09/12/19  Palliative Care Type  New Palliative care  Reason for referral  Clarify Goals of Care  Date of Admission  09/10/19  Date first seen by Palliative Care  09/14/19  # of days Palliative referral response time  2 Day(s)  # of days IP prior to Palliative referral  2  Clinical Assessment  Psychosocial & Spiritual Assessment  Palliative Care Outcomes      Patient Active Problem List   Diagnosis Date Noted  . Right lower lobe pneumonia 09/10/2019  . Acute respiratory failure with hypoxia ([place]) 09/10/2019  . Fever 09/10/2019  . History of DVT (deep vein thrombosis) 09/10/2019  . Acute DVT (deep venous thrombosis) ([place]) 08/14/2019  . Hematuria 08/14/2019  . CKD (chronic kidney disease) stage 3, GFR 30-59 ml/min 08/14/2019  . Dysphagia 08/14/2019  . AMS (altered mental status) 08/08/2019  . Bladder stone 06/01/2019  . Sepsis ([place]) 04/30/2019  . Acute pyelonephritis 04/30/2019  . Acute kidney injury ([place]) 04/30/2019  . Confusion   . Essential hypertension   . Acute encephalopathy 02/11/2015  . TIA (transient ischemic attack) 02/11/2015  . Hypertension   . Hyperlipidemia   . Calculus (=stone) 11/29/2012  . Malignant neoplasm of prostate ([place]) 11/29/2012  . Bladder outflow obstruction 11/29/2012    Palliative Care Assessment & Plan   Patient Profile:    Assessment:  86 yo gentleman with HTN prior stroke, III CKD, recent UTI and DVT.   Admitted with PNA, high risk aspiration, chronic dysphagia. PMT consult for goals of care discussions done, patient and daughter elected for PEG tube placement.   Patient underwent PEG tube placement and was to go to SNF rehab.   PMT has been  asked to re address goals of care with the patient's daughter Caren Griffins based on her request.   Patient with ongoing decline, debility, not able to participate much in therapy, now seeing deceased relatives and thinks he has already died. In mild to moderate distress.   Discussed with daughter Caren Griffins at bedside about the patient's current condition. She wishes to inquire more about comfort care, hospice and a natural trajectory of end of life care. She is tearful, states that she wishes they had not gotten the PEG tube placed, but that the patient himself wanted the PEG tube.   Recommendations/Plan:   ongoing family meetings with daughter Caren Griffins and ongoing goals of care discussions with her.  We talked about comfort measures and residential hospice. Also gave her information about home with hospice. How this contrasts with SNF rehab attempt+continuation of tube feeds was also discussed with her.  Caren Griffins will discuss with the rest of her family today, PMT to again follow up in am on 09-25-19.  Add low dose IV Morphine PRN and monitor.   Code Status:    Code Status Orders  (From admission, onward)         Start     Ordered   09/12/19 1814  Do not attempt resuscitation (DNR)  Continuous    Question Answer Comment  In the event of cardiac or respiratory ARREST Do not call a "code blue"   In the event of cardiac or respiratory ARREST Do not perform Intubation, CPR, defibrillation or ACLS   In the event of cardiac or respiratory ARREST Use medication by any route, position, wound care, and other measures to relive pain and suffering. May use oxygen, suction and manual treatment of airway obstruction as needed for comfort.      09/12/19 1813  Code Status History    Date Active Date Inactive Code Status Order ID Comments User Context   09/10/2019 2316 09/12/2019 1813 Full Code [id number]  Etta Quill, DO ED   08/08/2019 1947 08/15/2019 1844 Full Code [id number]  Jani Gravel, MD ED    05/01/2019 0109 05/04/2019 1617 Full Code [id number]  Elwyn Reach, MD ED   02/11/2015 1850 02/13/2015 1517 Full Code [id number]  Mendel Corning, MD ED   Advance Care Planning Activity       Prognosis:   guarded   Discharge Planning:  To Be Determined  Care plan was discussed with  Patient, daughter Caren Griffins at bedside and also with [facility] MD.   Thank you for allowing the Palliative Medicine Team to assist in the care of this patient.   Time In: 1330 Time Out: 1405 Total Time 35 Prolonged Time Billed  no       Greater than 50%  of this time was spent counseling and coordinating care related to the above assessment and plan.  Loistine Chance, MD  Please contact Palliative Medicine Team phone at [phone] for questions and concerns.

## 2019-09-24 NOTE — Progress Notes (Signed)
Patients right arm is swollen and slightly red/warm to touch in the antecubital area. Not swollen around IV site. Dr. Kyung Bacca made aware. Both arms elevated on pillows.

## 2019-09-24 NOTE — Progress Notes (Signed)
RN and NT was able to complete CHG bath. RN will continue to monitor.

## 2019-09-24 NOTE — Progress Notes (Signed)
PROGRESS NOTE  Barry Swanson ENI:[id number] DOB: [birth year] DOA: 09/10/2019 PCP: [name], MD  HPI/Recap of past 24 hours: Per IRW:ERXVQM Lewisis a 86 y.o.malewith medical history significant ofHTN, prior stroke, CKD stage 3. Admitted in Dec for AMS due to UTI, complicated by DVT. Discharged on eliquis which he has remained on. Pt presents to ED with 2 day history of worsening fever and SOB. Tm at home 103, no cough. No known COVID exposures. In the ED, temp 101.4, satting in the upper 80s on room air, improved to mid 90s on 3 L via [place]. D-dimer 0.53, WBC 10,000, procalcitonin 0.14, Covid POC and PCR negative. Chest x-ray shows right lower lobe infiltrate. He is continued on Antibiotics, Patient has high aspiration risk because of this chronic dysphagia. He underwent successful IR placed PEG tube, he was placed on heparin drip in anticipation for procedure.  He is started back on Eliquis post procedure, PEG tube feeding started , tolerating well, dosing will be increased.   09/23/2019 Subjective Patient seen and examined at bedside  September 24, 2019. Subjective: Patient seen and examined at bedside he is sleeping eyes closed.  Patient is still waiting for SNF.  Spoke to Caren Griffins his daughter over the phone who is concerned that her father is getting weaker and she is not sure if she is making the right decision to transfer him to rehab.  She was expecting that with the PEG placement that he would have pEcked up and would have become stronger by now and with his nutrition and she is concerned that patient has poor quality of life.  She also stated that when she visited him yesterday the father told her that he just wants to go and close his eyes and said he was going  Assessment/Plan: Principal Problem:   Right lower lobe pneumonia Active Problems:   Essential hypertension   CKD (chronic kidney disease) stage 3, GFR 30-59 ml/min   Acute respiratory failure with hypoxia (HCC)   Fever  History of DVT (deep vein thrombosis)    1. Sepsis/right lower lobe pneumonia / acute respiratory failure with hypoxia likely due to aspiration.   Patient has completed 10 days of cefepime and 6 days of doxycycline he was supposed to complete 10 days of doxycycline.  Repeat chest x-ray done yesterday showed multiple mild interval improvement in the opacity of the right middle and lower lobe field this morning he was noted to have desaturation on room air to about 88 and maintaining at 94 on 2 L/min attempting to wean him off failed   2. Essential hypertension.  Blood pressure is currently stable we will continue antihypertensive.   3. Chronic kidney disease stage IIIb.  We will continue to monitor renal function and avoiding nephrotoxic medication   4. History of chronic left lower extremity DVT.  Patient is on apixaban.  On apixaban  5. Benign prostatic hyperplasia  Continue finasteride  6. History of CVA  Continue aspirin and simvastatin  7. Hyperlipidemia Continue simvastatin    8. Chronic dysphagia.   Due to [facility] ring and esophageal dilatation on June 29, 2019 with esophageal dilated [name] but he continues to have significant swallowing trouble.  Speech therapy therapy consult was obtained and he underwent modified barium swallow they recommended nectar thick liquid but due to concern for continued aspiration with resultant right lower lobe pneumonia successfully underwent PEG tube placement on September 19, 2019.  GI recommended for outpatient esophageal dilatation.   9. Chronic heart failure with  preserved ejection fraction.  Patient is not in acute exacerbation the left ventricle are functioning well his last echocardiogram on August 08, 2019 showed ejection fraction of 55 to 60% we will continue aspirin statin and beta-blocker and Lasix as needed we will continue to limit his salt intake to 2 g.  #10  Care management decision.  I have consulted with  palliative care to help direct and guide therapy  Code Status: DNR  Severity of Illness: The appropriate patient status for this patient is INPATIENT. Inpatient status is judged to be reasonable and necessary in order to provide the required intensity of service to ensure the patient's safety. The patient's presenting symptoms, physical exam findings, and initial radiographic and laboratory data in the context of their chronic comorbidities is felt to place them at high risk for further clinical deterioration. Furthermore, it is not anticipated that the patient will be medically stable for discharge from the hospital within 2 midnights of admission. The following factors support the patient status of inpatient.   " Hypoxia waiting for SNF * I certify that at the point of admission it is my clinical judgment that the patient will require inpatient hospital care spanning beyond 2 midnights from the point of admission due to high intensity of service, high risk for further deterioration and high frequency of surveillance required.*    Family Communication: Spoke with his daughter Caren Griffins and updated she stated she is still trying to look for the appropriate nursing facility.  Disposition Plan: PT recc SNF-spoke to dtr Caren Griffins agree to Fax out to SNF-await bed offers.  88% on RA; 92% on 0.5    Consultants:  None  Procedures:  PEG tube placement  Antimicrobials:  Doxycycline  Cefepime  DVT prophylaxis: Eliquis   Objective: Vitals:   09/23/19 1309 09/23/19 2200 09/24/19 0423 09/24/19 1326  BP: 125/88 119/74 127/71 133/76  Pulse: 70 67 67 (!) 59  Resp: 17  20 20   Temp: 98.1 F (36.7 C) 98 F (36.7 C) 97.8 F (36.6 C) 97.8 F (36.6 C)  TempSrc: Oral Oral Oral Oral  SpO2: 99% 97% 96% 100%  Weight:      Height:        Intake/Output Summary (Last 24 hours) at 09/24/2019 1402 Last data filed at 09/24/2019 1000 Gross per 24 hour  Intake 2390.07 ml  Output 900 ml  Net 1490.07  ml   Filed Weights   09/10/19 2033  Weight: 74.8 kg   Body mass index is 23.68 kg/m.  Exam:  . General: 86 y.o. year-old male well developed well nourished in no acute distress.  Alert and oriented x3. . Cardiovascular: Regular rate and rhythm with no rubs or gallops.  No thyromegaly or JVD noted.   Marland Kitchen Respiratory: Clear to auscultation with no wheezes or rales. Good inspiratory effort. . Abdomen: Soft nontender nondistended with normal bowel sounds x4 quadrants. . Musculoskeletal: No lower extremity edema. 2/4 pulses in all 4 extremities. . Skin: No ulcerative lesions noted or rashes, . Psychiatry: Mood is appropriate for condition and setting    Data Reviewed: CBC: Recent Labs  Lab 09/20/19 0509 09/21/19 0439 09/22/19 0355 09/23/19 0520 09/24/19 0537  WBC 7.7 5.9 7.3 9.0 11.6*  HGB 11.8* 11.5* 11.8* 12.5* 12.3*  HCT 39.4 39.1 38.8* 41.5 42.0  MCV 91.2 92.2 91.1 90.2 93.3  PLT 248 223 224 234 277   Basic Metabolic Panel: Recent Labs  Lab 09/18/19 0443 09/18/19 0443 09/19/19 0430 09/20/19 0509 09/21/19 0439 09/22/19 0355  09/23/19 0520  NA 143   < > 140 141 139 140 141  K 3.3*   < > 3.6 3.8 3.5 3.6 3.7  CL 110   < > 107 109 105 106 100  CO2 23   < > 25 23 26 27 30   GLUCOSE 120*   < > 102* 87 115* 135* 127*  BUN 24*   < > 25* 24* 24* 25* 24*  CREATININE 1.36*   < > 1.26* 1.52* 1.38* 1.36* 1.43*  CALCIUM 9.0   < > 9.6 9.5 9.8 11.0* 11.0*  MG 1.9  --   --   --  2.0 2.4 2.3  PHOS 2.5  --   --   --  3.2 3.8 4.2   < > = values in this interval not displayed.   GFR: Estimated Creatinine Clearance: 36.9 mL/min (A) (by C-G formula based on SCr of 1.43 mg/dL (H)). Liver Function Tests: Recent Labs  Lab 09/18/19 0443 09/21/19 0439 09/22/19 0355  AST 11* 10* 10*  ALT 8 10 10   ALKPHOS 39 42 47  BILITOT 0.6 0.7 0.6  PROT 5.2* 5.2* 5.4*  ALBUMIN 2.5* 2.6* 2.6*   No results for input(s): LIPASE, AMYLASE in the last 168 hours. No results for input(s): AMMONIA in  the last 168 hours. Coagulation Profile: Recent Labs  Lab 09/19/19 0430  INR 1.3*   Cardiac Enzymes: No results for input(s): CKTOTAL, CKMB, CKMBINDEX, TROPONINI in the last 168 hours. BNP (last 3 results) No results for input(s): PROBNP in the last 8760 hours. HbA1C: No results for input(s): HGBA1C in the last 72 hours. CBG: No results for input(s): GLUCAP in the last 168 hours. Lipid Profile: No results for input(s): CHOL, HDL, LDLCALC, TRIG, CHOLHDL, LDLDIRECT in the last 72 hours. Thyroid Function Tests: No results for input(s): TSH, T4TOTAL, FREET4, T3FREE, THYROIDAB in the last 72 hours. Anemia Panel: No results for input(s): VITAMINB12, FOLATE, FERRITIN, TIBC, IRON, RETICCTPCT in the last 72 hours. Urine analysis:    Component Value Date/Time   COLORURINE YELLOW 09/23/2019 1735   APPEARANCEUR CLOUDY (A) 09/23/2019 1735   LABSPEC 1.011 09/23/2019 1735   PHURINE 5.0 09/23/2019 1735   GLUCOSEU NEGATIVE 09/23/2019 1735   HGBUR LARGE (A) 09/23/2019 1735   BILIRUBINUR NEGATIVE 09/23/2019 1735   KETONESUR NEGATIVE 09/23/2019 1735   PROTEINUR 100 (A) 09/23/2019 1735   UROBILINOGEN 1.0 02/28/2015 1550   NITRITE NEGATIVE 09/23/2019 1735   LEUKOCYTESUR LARGE (A) 09/23/2019 1735   Sepsis Labs: @LABRCNTIP (procalcitonin:4,lacticidven:4)  )No results found for this or any previous visit (from the past 240 hour(s)).    Studies: No results found.  Scheduled Meds: . apixaban  5 mg Per Tube BID  . chlorhexidine  15 mL Mouth Rinse BID  . Chlorhexidine Gluconate Cloth  6 each Topical Q0600  . mouth rinse  15 mL Mouth Rinse q12n4p  . multivitamin with minerals  1 tablet Per Tube Daily  . simvastatin  10 mg Per Tube QHS  . traZODone  50 mg Per Tube QHS    Continuous Infusions: . ceFEPime (MAXIPIME) IV Stopped (09/24/19 3888)  . feeding supplement (OSMOLITE 1.2 CAL) 1,000 mL (09/24/19 0600)     LOS: 14 days     Cristal Deer, MD [facility]  To reach me or  the doctor on call, go to: [email or web address] Password [email or web address]  09/24/2019, 2:02 PM

## 2019-09-25 ENCOUNTER — Inpatient Hospital Stay (HOSPITAL_COMMUNITY): Payer: Medicare Other

## 2019-09-25 DIAGNOSIS — R52 Pain, unspecified: Secondary | ICD-10-CM

## 2019-09-25 DIAGNOSIS — M7989 Other specified soft tissue disorders: Secondary | ICD-10-CM

## 2019-09-25 LAB — CBC
HCT: 38.7 % — ABNORMAL LOW (ref 39.0–52.0)
Hemoglobin: 11.4 g/dL — ABNORMAL LOW (ref 13.0–17.0)
MCH: 27.7 pg (ref 26.0–34.0)
MCHC: 29.5 g/dL — ABNORMAL LOW (ref 30.0–36.0)
MCV: 93.9 fL (ref 80.0–100.0)
Platelets: 269 10*3/uL (ref 150–400)
RBC: 4.12 MIL/uL — ABNORMAL LOW (ref 4.22–5.81)
RDW: 15.8 % — ABNORMAL HIGH (ref 11.5–15.5)
WBC: 9.9 10*3/uL (ref 4.0–10.5)
nRBC: 0 % (ref 0.0–0.2)

## 2019-09-25 MED ORDER — ADULT MULTIVITAMIN LIQUID CH
15.0000 mL | Freq: Every day | ORAL | Status: DC
  Administered 2019-09-26: 15 mL
  Filled 2019-09-25 (×2): qty 15

## 2019-09-25 MED ORDER — PRO-STAT SUGAR FREE PO LIQD
30.0000 mL | Freq: Every day | ORAL | Status: DC
  Administered 2019-09-25 – 2019-09-26 (×2): 30 mL
  Filled 2019-09-25 (×2): qty 30

## 2019-09-25 NOTE — Progress Notes (Addendum)
PT Cancellation Note  Patient Details Name: Barry Swanson MRN: [id number] DOB: 1928/12/07   Cancelled Treatment:    Reason Eval/Treat Not Completed: Will hold PT for now. Per chart, plan is possibly for residential hospice-awaiting rep consultation. If plan changes, will resume PT  Addendum: Plan is for residential hospice. Will sign off.    Doreatha Massed, PT Acute Rehabilitation

## 2019-09-25 NOTE — Progress Notes (Signed)
OT Cancellation Note  Patient Details Name: Savino Whisenant MRN: [id number] DOB: 1929/04/29   Cancelled Treatment:    Reason Eval/Treat Not Completed: Other (comment)  Noted plan to transfer to [facility]- will sign off.  Kari Baars, OT Acute Rehabilitation Services [phone] Office- [phone], Thereasa Parkin 09/25/2019, 5:36 PM

## 2019-09-25 NOTE — Progress Notes (Signed)
Received report from Harvey at [facility].

## 2019-09-25 NOTE — Progress Notes (Signed)
[facility]  Received request from [facility] for family interest in [place]. Chart reviewed and spoke with daughter to confirm plan to transfer to [facility] Tuesday morning. Will update TOC manager when daughter has completed paperwork.   Will need DC summary sent to [phone] prior to patient leaving the unit.   RN please call report to [phone] prior to patient leaving the unit.   Thank you,  Erling Conte, LCSW [phone]

## 2019-09-25 NOTE — Progress Notes (Signed)
SLP Cancellation Note  Patient Details Name: Barry Swanson MRN: [id number] DOB: 09-Mar-1929   Cancelled treatment:       Reason Eval/Treat Not Completed: Other (comment)(note pt for comfort care at this time, potentially for [facility], will sign off, please reorder if desire)   [name] 09/25/2019, 3:33 PM  Kathleen Lime, MS [place] Office [phone]

## 2019-09-25 NOTE — Progress Notes (Signed)
Nutrition Follow-up  DOCUMENTATION CODES:   Not applicable  INTERVENTION:  - continue Osmolite 1.2 @ 60 ml/hr and will add 30 ml prostat once/day (approved by MD). - this regimen will provide 1828 kcal, 95 grams protein, and 1181 ml free water.  - free water flush, if desired, to be per MD. - will change multivitamin from tablet form to liquid form. - attempt to re-weigh patient today, if feasible.    NUTRITION DIAGNOSIS:   Increased nutrient needs related to acute illness as evidenced by estimated needs. -ongoing  GOAL:   Patient will meet greater than or equal to 90% of their needs -met with TF regimen  MONITOR:   TF tolerance, Labs, Weight trends  ASSESSMENT:   86 y.o. male with medical history of HTN, prior stroke, and stage 3 CKD. He was admitted in December for AMS due to UTI, complicated by DVT. Discharged on eliquis which he has remained on. Patient presented to the ED with 2 day history of worsening fever and SOB. In the ED, temp 101.4, sating in the upper 80s on room air, improved to mid 90s on 3L via [place]. CXR indicated RLL infiltrate.  PEG placed by IR on 2/2. Patient is currently receiving Osmolite 1.2 @ 60 ml/hr which is providing 1728 kcal, 80 grams protein, and 1181 ml free water. Patient noted to be a/o to self only and agitated/combative this AM. He has not been weighed since 1/24.   Palliative Care following and last talked with patient's daughter yesterday afternoon. Plan for follow-up today. Discussions being had about GOC and plan at time of d/c.   Labs reviewed; BUN: 24 mg/dl, creatinine: 1.43 mg/dl, Ca: 11 mg/dl. Medications reviewed; daily multivitamin with minerals.      Diet Order:   Diet Order            Diet NPO time specified  Diet effective midnight              EDUCATION NEEDS:   No education needs have been identified at this time  Skin:  Skin Assessment: Reviewed RN Assessment  Last BM:  2/5  Height:   Ht Readings from Last 1  Encounters:  09/10/19 '5\' 10"'  (1.778 m)    Weight:   Wt Readings from Last 1 Encounters:  09/10/19 74.8 kg    Ideal Body Weight:  75.4 kg  BMI:  Body mass index is 23.68 kg/m.  Estimated Nutritional Needs:   Kcal:  1800-2000 kcal  Protein:  80-90 grams  Fluid:  >/= 2 L/day     Jarome Matin, MS, RD, LDN, CNSC Inpatient Clinical Dietitian RD pager # available in AMION  After hours/weekend pager # available in [facility]

## 2019-09-25 NOTE — Progress Notes (Addendum)
Daily Progress Note   Patient Name: Barry Swanson       Date: 09/25/2019 DOB: 12-20-1928  Age: 86 y.o. MRN#: [id number] Attending Physician: Elmarie Shiley, MD Primary Care Physician: [name], MD Admit Date: 09/10/2019  Reason for Consultation/Follow-up: Establishing goals of care  Subjective:  patient continues to appear in mild distress, chronically ill, appears pale. No family at bedside, discussed with TOC this am. See below.   Length of Stay: 15  Current Medications: Scheduled Meds:  . apixaban  5 mg Per Tube BID  . chlorhexidine  15 mL Mouth Rinse BID  . Chlorhexidine Gluconate Cloth  6 each Topical Q0600  . feeding supplement (PRO-STAT SUGAR FREE 64)  30 mL Per Tube Daily  . mouth rinse  15 mL Mouth Rinse q12n4p  . multivitamin  15 mL Per Tube Daily  . simvastatin  10 mg Per Tube QHS  . traZODone  50 mg Per Tube QHS    Continuous Infusions: . ceFEPime (MAXIPIME) IV 2 g (09/25/19 0534)  . feeding supplement (OSMOLITE 1.2 CAL) 1,000 mL (09/25/19 0041)    PRN Meds: gabapentin, morphine injection, OLANZapine  Physical Exam         Mild distress Has PEG Is on tube feeds via PEG No edema Awake not entirely alert Regular work of breathing S1 S2  Vital Signs: BP 130/72 (BP Location: Left Arm)   Pulse (!) 59   Temp 97.8 F (36.6 C) (Oral)   Resp 18   Ht 5\' 10"  (1.778 m)   Wt 74.8 kg   SpO2 100%   BMI 23.68 kg/m  SpO2: SpO2: 100 % O2 Device: O2 Device: Nasal Cannula O2 Flow Rate: O2 Flow Rate (L/min): 2 L/min  Intake/output summary:   Intake/Output Summary (Last 24 hours) at 09/25/2019 1044 Last data filed at 09/25/2019 0045 Gross per 24 hour  Intake 370 ml  Output 200 ml  Net 170 ml   LBM: Last BM Date: 09/22/19 Baseline Weight: Weight: 74.8 kg Most  recent weight: Weight: 74.8 kg       Palliative Assessment/Data:    Flowsheet Rows     Most Recent Value  Intake Tab  Referral Department  Hospitalist  Unit at Time of Referral  -- [urology/telementry]  Palliative Care Primary Diagnosis  Pulmonary  Date Notified  09/12/19  Palliative Care Type  New Palliative care  Reason for referral  Clarify Goals of Care  Date of Admission  09/10/19  Date first seen by Palliative Care  09/14/19  # of days Palliative referral response time  2 Day(s)  # of days IP prior to Palliative referral  2  Clinical Assessment  Psychosocial & Spiritual Assessment  Palliative Care Outcomes      Patient Active Problem List   Diagnosis Date Noted  . Palliative care by specialist   . Goals of care, counseling/discussion   . Right lower lobe pneumonia 09/10/2019  . Acute respiratory failure with hypoxia ([place]) 09/10/2019  . Fever 09/10/2019  . History of DVT (deep vein thrombosis) 09/10/2019  . Acute DVT (deep venous thrombosis) ([place]) 08/14/2019  . Hematuria 08/14/2019  . CKD (chronic kidney disease) stage 3, GFR 30-59 ml/min 08/14/2019  . Dysphagia 08/14/2019  . AMS (altered mental status) 08/08/2019  . Bladder stone 06/01/2019  . Sepsis ([place]) 04/30/2019  . Acute pyelonephritis 04/30/2019  . Acute kidney injury ([place]) 04/30/2019  . Confusion   . Essential hypertension   . Acute encephalopathy 02/11/2015  . TIA (transient ischemic attack) 02/11/2015  . Hypertension   . Hyperlipidemia   . Calculus (=stone) 11/29/2012  . Malignant neoplasm of prostate ([place]) 11/29/2012  . Bladder outflow obstruction 11/29/2012    Palliative Care Assessment & Plan   Patient Profile:    Assessment:  86 yo gentleman with HTN prior stroke, III CKD, recent UTI and DVT.   Admitted with PNA, high risk aspiration, chronic dysphagia. PMT consult for goals of care discussions done, patient and daughter elected for PEG tube placement.   Patient underwent PEG tube  placement and was to go to SNF rehab.   PMT has been asked to re address goals of care with the patient's daughter Barry Swanson based on her request.   Patient with ongoing decline, debility, not able to participate much in therapy, now seeing deceased relatives and thinks he has already died. In mild to moderate distress.   Discussed with daughter Barry Swanson at bedside about the patient's current condition. She wishes to inquire more about comfort care, hospice and a natural trajectory of end of life care. She is tearful, states that she wishes they had not gotten the PEG tube placed, but that the patient himself wanted the PEG tube.   Recommendations/Plan:  recommend proceeding with residential hospice evaluation and consultation with hospice.   continue low dose IV Morphine PRN and monitor.  I did discuss with Barry Swanson on 09-24-19 that residential hospice allows for comfort and symptom management at end of life, tube feedings are artificial source and are typically not given in a residential hospice setting. I discussed with her about risk of aspiration and concept of comfort feeds.   Code Status:    Code Status Orders  (From admission, onward)         Start     Ordered   09/12/19 1814  Do not attempt resuscitation (DNR)  Continuous    Question Answer Comment  In the event of cardiac or respiratory ARREST Do not call a "code blue"   In the event of cardiac or respiratory ARREST Do not perform Intubation, CPR, defibrillation or ACLS   In the event of cardiac or respiratory ARREST Use medication by any route, position, wound care, and other measures to relive pain and suffering. May use oxygen, suction and manual treatment of airway obstruction as needed for comfort.  09/12/19 1813        Code Status History    Date Active Date Inactive Code Status Order ID Comments User Context   09/10/2019 2316 09/12/2019 1813 Full Code [id number]  Etta Quill, DO ED   08/08/2019 1947 08/15/2019 1844  Full Code [id number]  Jani Gravel, MD ED   05/01/2019 0109 05/04/2019 1617 Full Code [id number]  Elwyn Reach, MD ED   02/11/2015 1850 02/13/2015 1517 Full Code [id number]  Mendel Corning, MD ED   Advance Care Planning Activity       Prognosis:   less than 2 weeks  Discharge Planning:  Residential hospice.   Care plan was discussed with  Patient, TOC  Thank you for allowing the Palliative Medicine Team to assist in the care of this patient.   Time In: 10 Time Out: 10.25 Total Time 25 Prolonged Time Billed  no       Greater than 50%  of this time was spent counseling and coordinating care related to the above assessment and plan.  Loistine Chance, MD  Please contact Palliative Medicine Team phone at [phone] for questions and concerns.

## 2019-09-25 NOTE — Progress Notes (Signed)
Patient grew agitated when RN attempted to replace current feeding tube se tup with new set up. Patient received PRN Zyprexa via PEG tube due to agitation and slapping/kicking at staff members.

## 2019-09-25 NOTE — Progress Notes (Signed)
PROGRESS NOTE    Barry Swanson  BCW:[id number] DOB: [birth year] DOA: 09/10/2019 PCP: [name], MD   Brief Narrative: 86 year old with past medical history significant for hypertension, prior stroke, CKD stage III admitted in December for altered mental status due to UTI, complicated by DVT.  Discharged on Eliquis.  Who presents to the ED with 2 days history of worsening fevers and shortness of breath.  He was found to be febrile temperature 101, oxygen sat in the 80s on room air.  Covid PCR negative.  Chest x-ray showed right lower lobe infiltrates.  He was treated with antibiotics.  He has chronic dysphagia and is at high risk for aspiration.  He underwent successful IR placement of PEG tube.  He has been tolerating tube feedings. Patient continued to be weak, failure to thrive.  Palliative care was consulted for goals of care.  Plan for palliative care meeting today.    Assessment & Plan:   Principal Problem:   Right lower lobe pneumonia Active Problems:   Essential hypertension   CKD (chronic kidney disease) stage 3, GFR 30-59 ml/min   Acute respiratory failure with hypoxia (HCC)   Fever   History of DVT (deep vein thrombosis)   Palliative care by specialist   Goals of care, counseling/discussion  1-Sepsis/right lower lobe pneumonia: Acute respiratory failure with hypoxemia: Likely related to aspiration. Patient completed 10 days course of cefepime and 6 days of doxycycline. Repeated chest x-ray showed improvement of opacity on fields. Patient on 2 L of oxygen. On cefepime day 3  2-Chronic Dysphagia: History of Zackowski ring and esophageal dilation on June 29, 2019 with esophageal dilation but continue to have significant esophageal problems.  Patient was evaluated by speech therapy, who recommended nectar thick liquid but due to continue aspiration and concern for aspiration patient underwent PEG tube placement on 09/19/2019. Continue with tube feedings.  3-CKD stage  IIIb: Monitor. Creatinine ranged from 1.2--1.5 during this hospitalization.  4-history of chronic left lower extremity DVT: Continue with Eliquis  5-BPH: Continue with finasteride  History of CVA: Continue with simvastatin.  Chronic diastolic heart failure with preserved ejection fraction; Compensated.   Hyperlipidemia: Continue with simvastatin essential hypertension:  Nutrition Problem: Increased nutrient needs Etiology: acute illness    Signs/Symptoms: estimated needs    Interventions: Tube feeding, Prostat  Estimated body mass index is 23.68 kg/m as calculated from the following:   Height as of this encounter: 5\' 10"  (1.778 m).   Weight as of this encounter: 74.8 kg.   DVT prophylaxis: Eliquis Code Status: DNR Family Communication: No family at bedside Disposition Plan:  Patient is from: Home Anticipated d/c date: To be determined awaiting palliative care meeting. Barriers to d/c or necessity for inpatient status: Awaiting palliative care meeting.  Disposition to be determined  Consultants:   Palliative care  IR  Procedures:   PEG tube  Antimicrobials:  Cefepime  Subjective: Alert, asking for water.   Objective: Vitals:   09/24/19 0423 09/24/19 1326 09/24/19 2005 09/25/19 0431  BP: 127/71 133/76 122/68 130/72  Pulse: 67 (!) 59 63 (!) 59  Resp: 20 20 18 18   Temp: 97.8 F (36.6 C) 97.8 F (36.6 C) 97.9 F (36.6 C) 97.8 F (36.6 C)  TempSrc: Oral Oral Oral Oral  SpO2: 96% 100% 97% 100%  Weight:      Height:        Intake/Output Summary (Last 24 hours) at 09/25/2019 1224 Last data filed at 09/25/2019 0045 Gross per 24 hour  Intake  370 ml  Output 200 ml  Net 170 ml   Filed Weights   09/10/19 2033  Weight: 74.8 kg    Examination:  General exam: Appears calm and comfortable  Respiratory system: Clear to auscultation. Respiratory effort normal. Cardiovascular system: S1 & S2 heard, RRR. No JVD, murmurs, rubs, gallops or clicks. No  pedal edema. Gastrointestinal system: Abdomen is nondistended, soft and nontender. No organomegaly or masses felt. Normal bowel sounds heard. Central nervous system: Alert  Extremities: Symmetric 5 x 5 power. Skin: No rashes, lesions or ulcers    Data Reviewed: I have personally reviewed following labs and imaging studies  CBC: Recent Labs  Lab 09/21/19 0439 09/22/19 0355 09/23/19 0520 09/24/19 0537 09/25/19 0502  WBC 5.9 7.3 9.0 11.6* 9.9  HGB 11.5* 11.8* 12.5* 12.3* 11.4*  HCT 39.1 38.8* 41.5 42.0 38.7*  MCV 92.2 91.1 90.2 93.3 93.9  PLT 223 224 234 278 947   Basic Metabolic Panel: Recent Labs  Lab 09/19/19 0430 09/20/19 0509 09/21/19 0439 09/22/19 0355 09/23/19 0520  NA 140 141 139 140 141  K 3.6 3.8 3.5 3.6 3.7  CL 107 109 105 106 100  CO2 25 23 26 27 30   GLUCOSE 102* 87 115* 135* 127*  BUN 25* 24* 24* 25* 24*  CREATININE 1.26* 1.52* 1.38* 1.36* 1.43*  CALCIUM 9.6 9.5 9.8 11.0* 11.0*  MG  --   --  2.0 2.4 2.3  PHOS  --   --  3.2 3.8 4.2   GFR: Estimated Creatinine Clearance: 36.9 mL/min (A) (by C-G formula based on SCr of 1.43 mg/dL (H)). Liver Function Tests: Recent Labs  Lab 09/21/19 0439 09/22/19 0355  AST 10* 10*  ALT 10 10  ALKPHOS 42 47  BILITOT 0.7 0.6  PROT 5.2* 5.4*  ALBUMIN 2.6* 2.6*   No results for input(s): LIPASE, AMYLASE in the last 168 hours. No results for input(s): AMMONIA in the last 168 hours. Coagulation Profile: Recent Labs  Lab 09/19/19 0430  INR 1.3*   Cardiac Enzymes: No results for input(s): CKTOTAL, CKMB, CKMBINDEX, TROPONINI in the last 168 hours. BNP (last 3 results) No results for input(s): PROBNP in the last 8760 hours. HbA1C: No results for input(s): HGBA1C in the last 72 hours. CBG: No results for input(s): GLUCAP in the last 168 hours. Lipid Profile: No results for input(s): CHOL, HDL, LDLCALC, TRIG, CHOLHDL, LDLDIRECT in the last 72 hours. Thyroid Function Tests: No results for input(s): TSH, T4TOTAL,  FREET4, T3FREE, THYROIDAB in the last 72 hours. Anemia Panel: No results for input(s): VITAMINB12, FOLATE, FERRITIN, TIBC, IRON, RETICCTPCT in the last 72 hours. Sepsis Labs: No results for input(s): PROCALCITON, LATICACIDVEN in the last 168 hours.  No results found for this or any previous visit (from the past 240 hour(s)).       Radiology Studies: VAS [place] UPPER EXTREMITY VENOUS DUPLEX  Result Date: 09/25/2019 UPPER VENOUS STUDY  Indications: Pain, and Swelling Comparison Study: no prior Performing Technologist: Abram Sander RVS  Examination Guidelines: A complete evaluation includes B-mode imaging, spectral Doppler, color Doppler, and power Doppler as needed of all accessible portions of each vessel. Bilateral testing is considered an integral part of a complete examination. Limited examinations for reoccurring indications may be performed as noted.  Right Findings: +----------+------------+---------+-----------+----------+-------+  RIGHT      Compressible Phasicity Spontaneous Properties Summary  +----------+------------+---------+-----------+----------+-------+  IJV            Full        Yes  Yes                         +----------+------------+---------+-----------+----------+-------+  Subclavian     Full        Yes        Yes                         +----------+------------+---------+-----------+----------+-------+  Axillary       Full        Yes        Yes                         +----------+------------+---------+-----------+----------+-------+  Brachial       Full        Yes        Yes                         +----------+------------+---------+-----------+----------+-------+  Radial         Full                                               +----------+------------+---------+-----------+----------+-------+  Ulnar          Full                                               +----------+------------+---------+-----------+----------+-------+  Cephalic       Full                                                +----------+------------+---------+-----------+----------+-------+  Basilic        Full                                               +----------+------------+---------+-----------+----------+-------+  Left Findings: +----------+------------+---------+-----------+----------+-------+  LEFT       Compressible Phasicity Spontaneous Properties Summary  +----------+------------+---------+-----------+----------+-------+  Subclavian     Full        Yes        Yes                         +----------+------------+---------+-----------+----------+-------+  Summary:  Right: No evidence of deep vein thrombosis in the upper extremity. No evidence of superficial vein thrombosis in the upper extremity.  Left: No evidence of thrombosis in the subclavian.  *See table(s) above for measurements and observations.    Preliminary         Scheduled Meds:  apixaban  5 mg Per Tube BID   chlorhexidine  15 mL Mouth Rinse BID   Chlorhexidine Gluconate Cloth  6 each Topical Q0600   feeding supplement (PRO-STAT SUGAR FREE 64)  30 mL Per Tube Daily   mouth rinse  15 mL Mouth Rinse q12n4p   multivitamin  15 mL Per Tube Daily   simvastatin  10 mg Per Tube QHS   traZODone  50 mg Per Tube QHS   Continuous Infusions:  ceFEPime (MAXIPIME) IV 2 g (09/25/19 0534)   feeding supplement (OSMOLITE 1.2 CAL) 1,000 mL (09/25/19 0041)     LOS: 15 days    Time spent: 35 minutes.     Elmarie Shiley, MD [facility]   If 7PM-7AM, please contact night-coverage [email or web address]  09/25/2019, 12:24 PM

## 2019-09-25 NOTE — TOC Progression Note (Signed)
Transition of Care [facility]) - Progression Note    Patient Details  Name: Barry Swanson MRN: [id number] Date of Birth: 01-10-1929  Transition of [facility] CM/SW Contact  Seanpatrick Maisano, Juliann Pulse, RN Phone Number: 09/25/2019, 12:01 PM  Clinical Narrative:  Awaiting acceptance from [facility] to eval.Cynthia White dtr in agreement to residential hospice.     Expected Discharge Plan: [place] Barriers to Discharge: Continued Medical Work up  Expected Discharge Plan and Services Expected Discharge Plan: [place]   Discharge Planning Services: CM Consult   Living arrangements for the past 2 months: Single Family Home                                       Social Determinants of Health (SDOH) Interventions    Readmission Risk Interventions Readmission Risk Prevention Plan 09/12/2019 05/03/2019  Transportation Screening Complete Complete  PCP or Specialist Appt within 3-5 Days Complete Complete  HRI or [place] Complete Complete  Social Work Consult for [place] Planning/Counseling Complete Complete  Palliative Care Screening Not Applicable Not Applicable  Medication Review [employer]) Complete Complete  Some recent data might be hidden

## 2019-09-25 NOTE — Progress Notes (Signed)
Upper extremity venous has been completed.   Preliminary results in CV Proc.   Abram Sander 09/25/2019 9:48 AM

## 2019-09-26 DIAGNOSIS — Z515 Encounter for palliative care: Secondary | ICD-10-CM

## 2019-09-26 LAB — CREATININE, SERUM
Creatinine, Ser: 1.83 mg/dL — ABNORMAL HIGH (ref 0.61–1.24)
GFR calc Af Amer: 37 mL/min — ABNORMAL LOW (ref >60)
GFR calc non Af Amer: 32 mL/min — ABNORMAL LOW (ref >60)

## 2019-09-26 MED ORDER — AMOXICILLIN-POT CLAVULANATE 875-125 MG PO TABS: 1.0000 | ORAL_TABLET | Freq: Two times a day (BID) | ORAL | 0 refills | Status: AC

## 2019-09-26 MED ORDER — BISACODYL 10 MG RE SUPP
10.0000 mg | Freq: Once | RECTAL | Status: AC
  Administered 2019-09-26: 10 mg via RECTAL
  Filled 2019-09-26: qty 1

## 2019-09-26 MED ORDER — APIXABAN 5 MG PO TABS: 5.0000 mg | ORAL_TABLET | Freq: Two times a day (BID) | ORAL | Status: AC

## 2019-09-26 MED ORDER — OLANZAPINE 5 MG PO TABS: 5.0000 mg | ORAL_TABLET | Freq: Two times a day (BID) | ORAL | Status: AC | PRN

## 2019-09-26 NOTE — Progress Notes (Signed)
Pt agitated,  Took off telemetry, abdominal binder, and gown, trying to climb out of bed, When I asked the patient what was wrong he stated it hurts. Writer medicated appropriately. Will continue to monitor

## 2019-09-26 NOTE — Progress Notes (Signed)
Patient discharging to [facility].  Report called to Helene Kelp at facility - all questions answered and agree to accept patient.  IV removed - WNL.  PEG in place and clamped for transfer.  PTAR to pick -up.  AVS placed with packet.

## 2019-09-26 NOTE — Plan of Care (Signed)
  Problem: Clinical Measurements: Goal: Ability to maintain clinical measurements within normal limits will improve Outcome: Adequate for Discharge Goal: Will remain free from infection Outcome: Adequate for Discharge Goal: Diagnostic test results will improve Outcome: Adequate for Discharge Goal: Respiratory complications will improve Outcome: Adequate for Discharge Goal: Cardiovascular complication will be avoided Outcome: Adequate for Discharge   Problem: Activity: Goal: Risk for activity intolerance will decrease Outcome: Adequate for Discharge   Problem: Coping: Goal: Level of anxiety will decrease Outcome: Adequate for Discharge   Problem: Elimination: Goal: Will not experience complications related to bowel motility Outcome: Adequate for Discharge   Problem: Pain Managment: Goal: General experience of comfort will improve Outcome: Adequate for Discharge   Problem: Safety: Goal: Ability to remain free from injury will improve Outcome: Adequate for Discharge   Problem: Skin Integrity: Goal: Risk for impaired skin integrity will decrease 09/26/2019 1231 by Samika Vetsch, Leitha Schuller, RN Outcome: Adequate for Discharge 09/26/2019 0751 by Grant Fontana, RN Outcome: Progressing   Problem: Activity: Goal: Ability to tolerate increased activity will improve Outcome: Adequate for Discharge   Problem: Clinical Measurements: Goal: Ability to maintain a body temperature in the normal range will improve Outcome: Adequate for Discharge   Problem: Respiratory: Goal: Ability to maintain adequate ventilation will improve Outcome: Adequate for Discharge Goal: Ability to maintain a clear airway will improve Outcome: Adequate for Discharge     Patient discharging to [facility] for Hospice care.

## 2019-09-26 NOTE — Consult Note (Signed)
   [facility] CM Inpatient Consult   09/26/2019  Barry Swanson [birth year] [id number]   [facility]  CM Follow up:  Chart review reveals current disposition plan is for residential hospice. No [facility] CM needs.  Netta Cedars, MSN, [place] [facility] Liaison Nurse Mobile Phone [phone]  Toll free office [phone]

## 2019-09-26 NOTE — Discharge Summary (Signed)
Physician Discharge Summary  Christia Coaxum OVF:[id number] DOB: [birth year] DOA: 09/10/2019  PCP: [name], MD  Admit date: 09/10/2019 Discharge date: 09/26/2019  Admitted From: home Disposition:  [place]  Recommendations for Outpatient Follow-up:  Cont hospice plan of care  Home Health:no  Equipment/Devices:none  Discharge Condition:Stable Code Status: DNR Diet recommendation:TUBE FEED  Brief/Interim Summary: 86 year old with past medical history significant for hypertension, prior stroke, CKD stage III admitted in December for altered mental status due to UTI, complicated by DVT on Eliquis presented to the ED with 2 days history of worsening fevers and shortness of breath.  He was found to be febrile temperature 101, oxygen sat in the 80s on room air.  Covid PCR negative.  Chest x-ray showed right lower lobe infiltrates.  He was treated with antibiotics.  He has chronic dysphagia and is at high risk for aspiration.  He underwent successful IR placement of PEG tube.  He has been tolerating tube feedings.  Patient was treated for sepsis with right lower lobe pneumonia, chronic dysphagia, CKD.  Patient continued to be weak, failure to thrive.  Palliative care was consulted for goals of care.  Patient was transitioned to DNR. Patient with ongoing decline, debility, not able to answer.  Much in therapy palliative care discussed with daughter at this time patient is being transitioned to comfort measures to [facility].  Discharge Diagnoses/issues addressed:   Sepsis with right lower lobe pneumonia/acute hypoxic respiratory failure, suspecting aspiration pneumonia.  Treated with cefepime here.Continue as needed supplemental oxygen.  Debility/failure to thrive/deconditioning -transitioning to comfort measures  Chronic dysphagia;History of Zackowski ring and esophageal dilation on June 29, 2019 with esophageal dilation but continue to have significant esophageal problems.   Patient was evaluated by speech therapy, who recommended nectar thick liquid but due to continue aspiration and concern for aspiration patient underwent PEG tube placement on 09/19/2019.  On tube feeding currently.  Essential hypertension-stable bp  CKD (chronic kidney disease) stage 3, GFR 30-59 ml/min: creat 1.2-1.5 slightly worsened 1.8.  History of DVT/history of CVA/history of chronic diastolic CHF/hyperlipidemia-continue home meds.  Consults: Palliative care  Subjective: Appears comfortable  Discharge Exam: Vitals:   09/25/19 2145 09/26/19 0452  BP: 132/69 137/82  Pulse: 61 60  Resp: 18 19  Temp: 97.9 F (36.6 C) (!) 97.5 F (36.4 C)  SpO2: 95% 95%   General: Pt is somnolent, in mild pain' Cardiovascular: RRR, S1/S2 +, no rubs, no gallops Respiratory: CTA bilaterally, no wheezing, no rhonchi Abdominal: Soft,  loWer abdomen tender- tight abd binDer + W peg TUBE Extremities: no edema, no cyanosis  Discharge Instructions   Allergies as of 09/26/2019   No Known Allergies     Medication List    STOP taking these medications   Apixaban Starter Pack 5 MG Tbpk Commonly known as: ELIQUIS STARTER PACK Replaced by: apixaban 5 MG Tabs tablet   feeding supplement (PRO-STAT SUGAR FREE 64) Liqd     TAKE these medications   amoxicillin-clavulanate 875-125 MG tablet Commonly known as: Augmentin Place 1 tablet into feeding tube 2 (two) times daily for 2 days.   apixaban 5 MG Tabs tablet Commonly known as: ELIQUIS Place 1 tablet (5 mg total) into feeding tube 2 (two) times daily. Replaces: Apixaban Starter Pack 5 MG Tbpk   Centrum Silver tablet Take 1 tablet by mouth daily.   COQ10 PO Take 1 tablet by mouth at bedtime.   feeding supplement (ENSURE ENLIVE) Liqd Take 237 mLs by mouth 2 (two) times daily  between meals.   finasteride 5 MG tablet Commonly known as: PROSCAR Take 5 mg by mouth at bedtime.   gabapentin 300 MG capsule Commonly known as: NEURONTIN Take  300 mg by mouth daily as needed (pain).   Garlic 8527 MG Tbec Take 2,000 mg by mouth daily.   OLANZapine 5 MG tablet Commonly known as: ZYPREXA Place 1 tablet (5 mg total) into feeding tube 2 (two) times daily as needed (agitation).   simvastatin 10 MG tablet Commonly known as: ZOCOR Take 10 mg by mouth at bedtime.   Vitamin B-12 5000 MCG Tbdp Take 5,000 mcg by mouth daily.   Vitamin D-3 125 MCG (5000 UT) Tabs Take 5,000 mg by mouth daily.   zolpidem 5 MG tablet Commonly known as: AMBIEN Take 1 tablet (5 mg total) by mouth at bedtime as needed for sleep.      Follow-up Information    [name], MD Follow up.   Specialty: Internal Medicine Contact information: [address] [place] [address] [phone]          No Known Allergies  The results of significant diagnostics from this hospitalization (including imaging, microbiology, ancillary and laboratory) are listed below for reference.    Microbiology: No results found for this or any previous visit (from the past 240 hour(s)).  Procedures/Studies: DG Chest 1 View  Result Date: 09/17/2019 CLINICAL DATA:  Patient with fever and shortness of breath. EXAM: CHEST  1 VIEW COMPARISON:  Chest radiograph 09/10/2019 FINDINGS: Monitoring leads overlie the patient. Stable enlarged cardiac and mediastinal contours. Aortic atherosclerosis. Interval improvement in interstitial opacities within the right mid and lower lung. Persistent opacities left lung base. IMPRESSION: Mild interval improvement opacities right mid and lower lung. Persistent opacities left lung base. Electronically Signed   By: Lovey Newcomer M.D.   On: 09/17/2019 09:45   NM Pulmonary Perfusion  Result Date: 09/11/2019 CLINICAL DATA:  Short of breath. Concern for pulmonary embolism. History of DVT. EXAM: NUCLEAR MEDICINE PERFUSION LUNG SCAN TECHNIQUE: Perfusion images were obtained in multiple projections after intravenous injection of radiopharmaceutical.  Ventilation scans intentionally deferred if perfusion scan and chest x-ray adequate for interpretation during COVID 19 epidemic. RADIOPHARMACEUTICALS:  1.6 mCi Tc-48m MAA IV COMPARISON:  09/10/2019 FINDINGS: No wedge-shaped peripheral perfusion defects within the LEFT or RIGHT lung to suggest acute pulmonary embolism. IMPRESSION: No evidence of acute pulmonary embolism. Electronically Signed   By: Suzy Bouchard M.D.   On: 09/11/2019 13:19   IR GASTROSTOMY TUBE MOD SED  Result Date: 09/19/2019 INDICATION: Dysphagia. Please place percutaneous gastrostomy tube for enteric nutrition supplementation purposes. EXAM: PULL TROUGH GASTROSTOMY TUBE PLACEMENT COMPARISON:  CT abdomen and pelvis-04/21/2019 MEDICATIONS: The patient is currently amount to the hospital receiving intravenous antibiotics; Antibiotics were administered within 1 hour of the procedure. Glucagon 1 mg IV CONTRAST:  20 mL of Omnipaque 300 administered into the gastric lumen. ANESTHESIA/SEDATION: Moderate (conscious) sedation was employed during this procedure. A total of Versed 1 mg and Fentanyl 25 mcg was administered intravenously. Moderate Sedation Time: 33 minutes. The patient's level of consciousness and vital signs were monitored continuously by radiology nursing throughout the procedure under my direct supervision. FLUOROSCOPY TIME:  7 minutes, 6 seconds (400 mGy) COMPLICATIONS: None immediate. PROCEDURE: Informed written consent was obtained from the patient and the patient's daughter following explanation of the procedure, risks, benefits and alternatives. A time out was performed prior to the initiation of the procedure. Ultrasound scanning was performed to demarcate the edge of the left lobe  of the liver. Maximal barrier sterile technique utilized including caps, mask, sterile gowns, sterile gloves, large sterile drape, hand hygiene and Betadine prep. The left upper quadrant was sterilely prepped and draped. An oral gastric catheter was  inserted into the stomach under fluoroscopy. The existing nasogastric feeding tube was removed. The left costal margin and air opacified transverse colon were identified and avoided. Air was injected into the stomach for insufflation and visualization under fluoroscopy. Under sterile conditions a 17 gauge trocar needle was utilized to access the stomach percutaneously beneath the left subcostal margin after the overlying soft tissues were anesthetized with 1% Lidocaine with epinephrine. Needle position was confirmed within the stomach with aspiration of air and injection of small amount of contrast. A single T tack was deployed for gastropexy. Over an Amplatz guide wire, a 9-French sheath was inserted into the stomach. A snare device was utilized to capture the oral gastric catheter. The snare device was pulled retrograde from the stomach up the esophagus and out the oropharynx. The 20-French pull-through gastrostomy was connected to the snare device and pulled antegrade through the oropharynx down the esophagus into the stomach and then through the percutaneous tract external to the patient. The gastrostomy was assembled externally. Contrast injection confirms position in the stomach. Several spot radiographic images were obtained in various obliquities for documentation. The patient tolerated procedure well without immediate post procedural complication. FINDINGS: After successful fluoroscopic guided placement, the gastrostomy tube is appropriately positioned with internal disc against the ventral inner aspect of the gastric lumen. IMPRESSION: Successful fluoroscopic insertion of a 20-French pull-through gastrostomy tube. The gastrostomy may be used immediately for medication administration and in 24 hrs for the initiation of feeds. Electronically Signed   By: Sandi Mariscal M.D.   On: 09/19/2019 11:44   DG CHEST PORT 1 VIEW  Result Date: 09/21/2019 CLINICAL DATA:  Shortness of breath. EXAM: PORTABLE CHEST 1 VIEW  COMPARISON:  September 17, 2019 FINDINGS: Mild areas of atelectasis and/or infiltrate are seen within the bilateral lung bases. This is stable in appearance when compared to the prior study. There is no evidence of a pleural effusion or pneumothorax. The cardiac silhouette is mildly enlarged. There is mild calcification of the aortic arch. The visualized skeletal structures are unremarkable. IMPRESSION: 1. Mild, stable bibasilar atelectasis and/or infiltrate. Electronically Signed   By: Virgina Norfolk M.D.   On: 09/21/2019 16:24   DG Chest Port 1 View  Result Date: 09/10/2019 CLINICAL DATA:  Shortness of breath, fever and hypoxia. EXAM: PORTABLE CHEST 1 VIEW COMPARISON:  August 08, 2019 FINDINGS: Mild infiltrate is seen within the right lung base. There is no evidence of a pleural effusion or pneumothorax. The heart size and mediastinal contours are within normal limits. The visualized skeletal structures are unremarkable. IMPRESSION: Mild right basilar infiltrate. Electronically Signed   By: Virgina Norfolk M.D.   On: 09/10/2019 20:58   DG Swallowing Func-Speech Pathology  Result Date: 09/15/2019 Objective Swallowing Evaluation: Type of Study: MBS-Modified Barium Swallow Study  Patient Details Name: Carley Strickling MRN: [id number] Date of Birth: 06-Dec-1928 Today's Date: 09/15/2019 Time: SLP Start Time (ACUTE ONLY): 0809 -SLP Stop Time (ACUTE ONLY): 0831 SLP Time Calculation (min) (ACUTE ONLY): 22 min Past Medical History: Past Medical History: Diagnosis Date . Hepatitis B  . HOH (hard of hearing)  . Hyperlipidemia  . Hypertension  . Stroke ([place])  . UTI (urinary tract infection)  Past Surgical History: Past Surgical History: Procedure Laterality Date . BALLOON DILATION N/A 06/29/2019  Procedure: BALLOON DILATION;  Surgeon: Wonda Horner, MD;  Location: [facility];  Service: Endoscopy;  Laterality: N/A; . BLADDER STONE REMOVAL    blasted . CATARACT EXTRACTION W/ INTRAOCULAR LENS  IMPLANT, BILATERAL  2004 .  CYSTOSCOPY WITH LITHOLAPAXY N/A 06/01/2019  Procedure: CYSTOSCOPY WITH LITHOLAPAXY;  Surgeon: Ardis Hughs, MD;  Location: [facility];  Service: Urology;  Laterality: N/A; . ESOPHAGOGASTRODUODENOSCOPY (EGD) WITH PROPOFOL N/A 06/29/2019  Procedure: ESOPHAGOGASTRODUODENOSCOPY (EGD) WITH PROPOFOL WITH POSSIBLE DIL;  Surgeon: Wonda Horner, MD;  Location: [facility];  Service: Endoscopy;  Laterality: N/A; . PROSTATE SURGERY    trimmed prostate away from urethral tract HPI: 86 yo male adm to [facility] with respiratory difficulties, found to have RLL pna.  Pt with h/o esophageal dysmotility, ? Schatzki's ring s/p dilatation 06/2019.  He reports to this SLP, sensation of food lodging in left side of throat and states if he swallows on the right, it goes down better.  He does cough and bring up food/drink with secretions per his statement with direct question cue.  Pt states his problems have been going on "for years" and denies worsening.  He indicated his "throat was stretched" = pointing to left lower throat. SLP printed his esophagram and endoscopy report reviewing that he had distal esophagus dilated at the [facility].  Pt PMH + for prostate cancer, TIA, DVT.  He was recently in the hospital in December 2020.  Swallow evaluation ordered. Pt underwent BSE yesterday, NT informed SLP that pt coughed excessively and vomited with dinner and this am his tray was removed due to excessive coughing/ wet voice.  Follow up indicated for dysphagia management.  Subjective: pt awake in chair Assessment / Plan / Recommendation CHL IP CLINICAL IMPRESSIONS 09/15/2019 Clinical Impression Patient presents with mild oral dysphagia (? Cognitive vs neuro (old cva) based with suspected primary esophageal deficits.  Oral deficits c/b decreased oral coordination resulting in premature spillage of boluses into pharynx/larynx.  Patient aspirated into airway prior to intiiation of tsp thin barium - silently.  But also likely aspirated later in the study  (flouro was off during actual aspiration but barium was visualized in airway) due to oral residuals from sequential straw bolus of thin spilling into open airway.  Delayed cough noted - within 15 seconds - that was not protective.   Small single boluses were not aspirated or penetrated with thin.  No aspiration or penetration with nectar, pudding or moistened cracker boluses.  Upon esophageal sweep, pt with barium filled esophagus.  He consumed only single bolus of cracker and pudding as well as tsp/straw bolus of nectar and approx 3 ounces of thin barium.  Clearance of esophagus appeared impaired with all consistencies and pt's esophagus was full to just below UES at completion of study without his awareness.  Oral deficits may be compensated for but pt's aspiration risk is due to his esophageal dysmotility (as diagnosed by radiologist in 2020).  Left pt sitting upright as possible given full esophagus upon sliding him into bed.  Again would advise if he desires po with known risks, liquids (NO PUDDING OR GRITS) may be better tolerated than solids/pudding but pt is at risk across all consistencies.  Pt educated to findings using monitor during test.  Will follow up x1 to inform family of dysphagia. SLP Visit Diagnosis Dysphagia, oral phase (R13.11) Attention and concentration deficit following -- Frontal lobe and executive function deficit following -- Impact on safety and function Risk for inadequate nutrition/hydration;Severe aspiration risk  CHL IP TREATMENT RECOMMENDATION 09/15/2019 Treatment Recommendations Therapy as outlined in treatment plan below   Prognosis 09/11/2019 Prognosis for Safe Diet Advancement Fair Barriers to Reach Goals -- Barriers/Prognosis Comment -- CHL IP DIET RECOMMENDATION 09/15/2019 SLP Diet Recommendations NPO except meds;Ice chips PRN after oral care Liquid Administration via -- Medication Administration (No Data) Compensations Small sips/bites;Slow rate Postural Changes Seated upright  at 90 degrees;Remain semi-upright after after feeds/meals (Comment)   CHL IP OTHER RECOMMENDATIONS 09/15/2019 Recommended Consults -- Oral Care Recommendations Oral care QID Other Recommendations --   CHL IP FOLLOW UP RECOMMENDATIONS 09/15/2019 Follow up Recommendations None   CHL IP FREQUENCY AND DURATION 09/15/2019 Speech Therapy Frequency (ACUTE ONLY) min 1 x/week Treatment Duration 1 week      CHL IP ORAL PHASE 09/15/2019 Oral Phase Impaired Oral - Pudding Teaspoon -- Oral - Pudding Cup -- Oral - Honey Teaspoon -- Oral - Honey Cup -- Oral - Nectar Teaspoon -- Oral - Nectar Cup Delayed oral transit;Piecemeal swallowing;Decreased bolus cohesion;Lingual/palatal residue Oral - Nectar Straw Delayed oral transit;Piecemeal swallowing;Decreased bolus cohesion;Lingual/palatal residue Oral - Thin Teaspoon Premature spillage;Lingual/palatal residue Oral - Thin Cup Decreased bolus cohesion;Premature spillage;Lingual/palatal residue Oral - Thin Straw Decreased bolus cohesion;Premature spillage;Lingual/palatal residue Oral - Puree Lingual/palatal residue;Piecemeal swallowing Oral - Mech Soft WFL Oral - Regular -- Oral - Multi-Consistency -- Oral - Pill -- Oral Phase - Comment --  CHL IP PHARYNGEAL PHASE 09/15/2019 Pharyngeal Phase Impaired Pharyngeal- Pudding Teaspoon -- Pharyngeal -- Pharyngeal- Pudding Cup -- Pharyngeal -- Pharyngeal- Honey Teaspoon -- Pharyngeal -- Pharyngeal- Honey Cup -- Pharyngeal -- Pharyngeal- Nectar Teaspoon WFL Pharyngeal Material does not enter airway Pharyngeal- Nectar Cup -- Pharyngeal -- Pharyngeal- Nectar Straw WFL;Delayed swallow initiation-vallecula Pharyngeal Material does not enter airway Pharyngeal- Thin Teaspoon Penetration/Aspiration before swallow Pharyngeal Material enters airway, passes BELOW cords without attempt by patient to eject out (silent aspiration) Pharyngeal- Thin Cup Delayed swallow initiation-vallecula Pharyngeal Material does not enter airway Pharyngeal- Thin Straw  Penetration/Apiration after swallow Pharyngeal Material enters airway, passes BELOW cords and not ejected out despite cough attempt by patient Pharyngeal- Puree WFL;Pharyngeal residue - valleculae Pharyngeal -- Pharyngeal- Mechanical Soft WFL Pharyngeal Material does not enter airway Pharyngeal- Regular -- Pharyngeal -- Pharyngeal- Multi-consistency -- Pharyngeal -- Pharyngeal- Pill -- Pharyngeal -- Pharyngeal Comment --  CHL IP CERVICAL ESOPHAGEAL PHASE 09/15/2019 Cervical Esophageal Phase Impaired Pudding Teaspoon -- Pudding Cup -- Honey Teaspoon -- Honey Cup -- Nectar Teaspoon -- Nectar Cup -- Nectar Straw -- Thin Teaspoon -- Thin Cup -- Thin Straw -- Puree -- Mechanical Soft -- Regular -- Multi-consistency -- Pill -- Cervical Esophageal Comment appearance of prominent cricopharyngeus, did not impair barium flow [name] 09/15/2019, 9:46 AM              VAS [place] UPPER EXTREMITY VENOUS DUPLEX  Result Date: 09/25/2019 UPPER VENOUS STUDY  Indications: Pain, and Swelling Comparison Study: no prior Performing Technologist: Abram Sander RVS  Examination Guidelines: A complete evaluation includes B-mode imaging, spectral Doppler, color Doppler, and power Doppler as needed of all accessible portions of each vessel. Bilateral testing is considered an integral part of a complete examination. Limited examinations for reoccurring indications may be performed as noted.  Right Findings: +----------+------------+---------+-----------+----------+-------+ RIGHT     CompressiblePhasicitySpontaneousPropertiesSummary +----------+------------+---------+-----------+----------+-------+ IJV           Full       Yes       Yes                      +----------+------------+---------+-----------+----------+-------+  Subclavian    Full       Yes       Yes                      +----------+------------+---------+-----------+----------+-------+ Axillary      Full       Yes       Yes                       +----------+------------+---------+-----------+----------+-------+ Brachial      Full       Yes       Yes                      +----------+------------+---------+-----------+----------+-------+ Radial        Full                                          +----------+------------+---------+-----------+----------+-------+ Ulnar         Full                                          +----------+------------+---------+-----------+----------+-------+ Cephalic      Full                                          +----------+------------+---------+-----------+----------+-------+ Basilic       Full                                          +----------+------------+---------+-----------+----------+-------+  Left Findings: +----------+------------+---------+-----------+----------+-------+ LEFT      CompressiblePhasicitySpontaneousPropertiesSummary +----------+------------+---------+-----------+----------+-------+ Subclavian    Full       Yes       Yes                      +----------+------------+---------+-----------+----------+-------+  Summary:  Right: No evidence of deep vein thrombosis in the upper extremity. No evidence of superficial vein thrombosis in the upper extremity.  Left: No evidence of thrombosis in the subclavian.  *See table(s) above for measurements and observations.  Diagnosing physician: Monica Martinez MD Electronically signed by Monica Martinez MD on 09/25/2019 at 4:15:08 PM.    Final     Labs: BNP (last 3 results) Recent Labs    05/04/19 0236 08/09/19 0436 09/11/19 0504  BNP 292.8* 197.5* 774.1*   Basic Metabolic Panel: Recent Labs  Lab 09/20/19 0509 09/21/19 0439 09/22/19 0355 09/23/19 0520 09/26/19 0504  NA 141 139 140 141  --   K 3.8 3.5 3.6 3.7  --   CL 109 105 106 100  --   CO2 23 26 27 30   --   GLUCOSE 87 115* 135* 127*  --   BUN 24* 24* 25* 24*  --   CREATININE 1.52* 1.38* 1.36* 1.43* 1.83*  CALCIUM 9.5 9.8 11.0* 11.0*  --    MG  --  2.0 2.4 2.3  --   PHOS  --  3.2 3.8 4.2  --    Liver Function Tests: Recent Labs  Lab 09/21/19 0439 09/22/19 0355  AST 10* 10*  ALT 10 10  ALKPHOS 42 47  BILITOT 0.7 0.6  PROT 5.2* 5.4*  ALBUMIN 2.6* 2.6*   No results for input(s): LIPASE, AMYLASE in the last 168 hours. No results for input(s): AMMONIA in the last 168 hours. CBC: Recent Labs  Lab 09/21/19 0439 09/22/19 0355 09/23/19 0520 09/24/19 0537 09/25/19 0502  WBC 5.9 7.3 9.0 11.6* 9.9  HGB 11.5* 11.8* 12.5* 12.3* 11.4*  HCT 39.1 38.8* 41.5 42.0 38.7*  MCV 92.2 91.1 90.2 93.3 93.9  PLT 223 224 234 278 269   Cardiac Enzymes: No results for input(s): CKTOTAL, CKMB, CKMBINDEX, TROPONINI in the last 168 hours. BNP: Invalid input(s): POCBNP CBG: No results for input(s): GLUCAP in the last 168 hours. D-Dimer No results for input(s): DDIMER in the last 72 hours. Hgb A1c No results for input(s): HGBA1C in the last 72 hours. Lipid Profile No results for input(s): CHOL, HDL, LDLCALC, TRIG, CHOLHDL, LDLDIRECT in the last 72 hours. Thyroid function studies No results for input(s): TSH, T4TOTAL, T3FREE, THYROIDAB in the last 72 hours.  Invalid input(s): FREET3 Anemia work up No results for input(s): VITAMINB12, FOLATE, FERRITIN, TIBC, IRON, RETICCTPCT in the last 72 hours. Urinalysis    Component Value Date/Time   COLORURINE YELLOW 09/23/2019 1735   APPEARANCEUR CLOUDY (A) 09/23/2019 1735   LABSPEC 1.011 09/23/2019 1735   PHURINE 5.0 09/23/2019 1735   GLUCOSEU NEGATIVE 09/23/2019 1735   HGBUR LARGE (A) 09/23/2019 1735   BILIRUBINUR NEGATIVE 09/23/2019 1735   KETONESUR NEGATIVE 09/23/2019 1735   PROTEINUR 100 (A) 09/23/2019 1735   UROBILINOGEN 1.0 02/28/2015 1550   NITRITE NEGATIVE 09/23/2019 1735   LEUKOCYTESUR LARGE (A) 09/23/2019 1735   Sepsis Labs Invalid input(s): PROCALCITONIN,  WBC,  LACTICIDVEN Microbiology No results found for this or any previous visit (from the past 240  hour(s)).   Time coordinating discharge: 35  minutes  SIGNED: Antonieta Pert, MD  [facility] 09/26/2019, 9:45 AM  If 7PM-7AM, please contact night-coverage [email or web address]

## 2019-09-26 NOTE — TOC Transition Note (Signed)
Transition of Care [facility]) - CM/SW Discharge Note   Patient Details  Name: Barry Swanson MRN: [id number] Date of Birth: 05/12/1929  Transition of Care [facility]) CM/SW Contact:  Dessa Phi, RN Phone Number: 09/26/2019, 11:12 AM   Clinical Narrative: [facility] called for 12p pick up per nsg. No further CM needs.      Final next level of care: [place] Barriers to Discharge: No Barriers Identified   Patient Goals and CMS Choice Patient states their goals for this hospitalization and ongoing recovery are:: go home      Discharge Placement              Patient chooses bed at: [facility])   Name of family member notified: Harlan Stains Patient and family notified of of transfer: 09/26/19  Discharge Plan and Services   Discharge Planning Services: CM Consult                                 Social Determinants of Health ([place]) Interventions     Readmission Risk Interventions Readmission Risk Prevention Plan 09/12/2019 05/03/2019  Transportation Screening Complete Complete  PCP or Specialist Appt within 3-5 Days Complete Complete  HRI or [place] Complete Complete  Social Work Consult for [place] Planning/Counseling Complete Complete  Palliative Care Screening Not Applicable Not Applicable  Medication Review [employer]) Complete Complete  Some recent data might be hidden

## 2019-09-26 NOTE — Progress Notes (Signed)
Daily Progress Note   Patient Name: Barry Swanson       Date: 09/26/2019 DOB: 04-19-1929  Age: 86 y.o. MRN#: [id number] Attending Physician: Antonieta Pert, MD Primary Care Physician: [name], MD Admit Date: 09/10/2019  Reason for Consultation/Follow-up: Establishing goals of care  Subjective: Patient noted to be intermittently distress.  Resting comfortably during my encounter.   Chronically ill, appears pale. No family at bedside.  Report has been called to BP per chart review and awaiting transport.  Brief evaluation today for symptom needs prior to transport.  Length of Stay: 16  Current Medications: Scheduled Meds:  . apixaban  5 mg Per Tube BID  . chlorhexidine  15 mL Mouth Rinse BID  . Chlorhexidine Gluconate Cloth  6 each Topical Q0600  . feeding supplement (PRO-STAT SUGAR FREE 64)  30 mL Per Tube Daily  . mouth rinse  15 mL Mouth Rinse q12n4p  . multivitamin  15 mL Per Tube Daily  . simvastatin  10 mg Per Tube QHS  . traZODone  50 mg Per Tube QHS    Continuous Infusions: . ceFEPime (MAXIPIME) IV 2 g (09/26/19 0550)  . feeding supplement (OSMOLITE 1.2 CAL) 1,000 mL (09/25/19 1648)    PRN Meds: gabapentin, morphine injection, OLANZapine  Physical Exam         No distress Awake not entirely alert Regular work of breathing S1 S2  Vital Signs: BP 137/82 (BP Location: Right Arm)   Pulse 60   Temp (!) 97.5 F (36.4 C) (Oral)   Resp 19   Ht 5\' 10"  (1.778 m)   Wt 77.8 kg   SpO2 95%   BMI 24.61 kg/m  SpO2: SpO2: 95 % O2 Device: O2 Device: Nasal Cannula O2 Flow Rate: O2 Flow Rate (L/min): 2 L/min  Intake/output summary:   Intake/Output Summary (Last 24 hours) at 09/26/2019 1217 Last data filed at 09/26/2019 0453 Gross per 24 hour  Intake -  Output 700 ml  Net  -700 ml   LBM: Last BM Date: 09/22/19 Baseline Weight: Weight: 74.8 kg Most recent weight: Weight: 77.8 kg       Palliative Assessment/Data:    Flowsheet Rows     Most Recent Value  Intake Tab  Referral Department  Hospitalist  Unit at Time of Referral  -- [urology/telementry]  Palliative Care  Primary Diagnosis  Pulmonary  Date Notified  09/12/19  Palliative Care Type  New Palliative care  Reason for referral  Clarify Goals of Care  Date of Admission  09/10/19  Date first seen by Palliative Care  09/14/19  # of days Palliative referral response time  2 Day(s)  # of days IP prior to Palliative referral  2  Clinical Assessment  Psychosocial & Spiritual Assessment  Palliative Care Outcomes      Patient Active Problem List   Diagnosis Date Noted  . Palliative care by specialist   . Goals of care, counseling/discussion   . Right lower lobe pneumonia 09/10/2019  . Acute respiratory failure with hypoxia ([place]) 09/10/2019  . Fever 09/10/2019  . History of DVT (deep vein thrombosis) 09/10/2019  . Acute DVT (deep venous thrombosis) ([place]) 08/14/2019  . Hematuria 08/14/2019  . CKD (chronic kidney disease) stage 3, GFR 30-59 ml/min 08/14/2019  . Dysphagia 08/14/2019  . AMS (altered mental status) 08/08/2019  . Bladder stone 06/01/2019  . Sepsis ([place]) 04/30/2019  . Acute pyelonephritis 04/30/2019  . Acute kidney injury ([place]) 04/30/2019  . Confusion   . Essential hypertension   . Acute encephalopathy 02/11/2015  . TIA (transient ischemic attack) 02/11/2015  . Hypertension   . Hyperlipidemia   . Calculus (=stone) 11/29/2012  . Malignant neoplasm of prostate ([place]) 11/29/2012  . Bladder outflow obstruction 11/29/2012    Palliative Care Assessment & Plan   Patient Profile:    Assessment:  86 yo gentleman with HTN prior stroke, III CKD, recent UTI and DVT.   Admitted with PNA, high risk aspiration, chronic dysphagia. PMT consult for goals of care discussions done, patient  and daughter elected for PEG tube placement.   Patient underwent PEG tube placement and was to go to SNF rehab but has declined further and plan now for residential hospice.   Recommendations/Plan: - Plan for [employer] today. He appears stable for transport.  If any signs of agitation, would recommend dose of zyprexa prior to transport.  Code Status:    Code Status Orders  (From admission, onward)         Start     Ordered   09/12/19 1814  Do not attempt resuscitation (DNR)  Continuous    Question Answer Comment  In the event of cardiac or respiratory ARREST Do not call a "code blue"   In the event of cardiac or respiratory ARREST Do not perform Intubation, CPR, defibrillation or ACLS   In the event of cardiac or respiratory ARREST Use medication by any route, position, wound care, and other measures to relive pain and suffering. May use oxygen, suction and manual treatment of airway obstruction as needed for comfort.      09/12/19 1813         Prognosis:   less than 2 weeks  Discharge Planning:  Residential hospice.   Thank you for allowing the Palliative Medicine Team to assist in the care of this patient.   Time In: 1145 Time Out: 1200 Total Time 15 Prolonged Time Billed  no       Greater than 50%  of this time was spent counseling and coordinating care related to the above assessment and plan.  Micheline Rough, MD  Please contact Palliative Medicine Team phone at [phone] for questions and concerns.

## 2019-10-16 DEATH — deceased

## 2020-11-01 IMAGING — CT CT ABD-PELV W/O CM
2 of 4 series · 12 of 46 positions shown, 14 images · non-contrast
Comparison: CT abdomen/pelvis dated 06/03/2012

CLINICAL DATA: Vomiting, weight loss, history of prostate cancer
status post TURP

EXAM:
CT ABDOMEN AND PELVIS WITHOUT CONTRAST
TECHNIQUE: Multidetector CT imaging of the abdomen and pelvis was performed
following the standard protocol without IV contrast.

[Series 2: routine abdomen pelvis without 5.00 br40 s3 axial · axial · non-contrast · 0.71mm/px · z∈[+1308,+1763]mm · 9 of 111 slices shown, 11 images]
[im 10/111  soft-tissue]
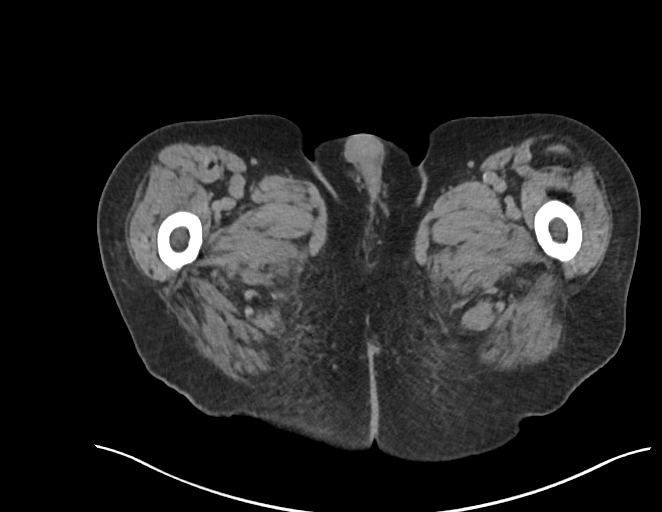
[im 10/111  bone]
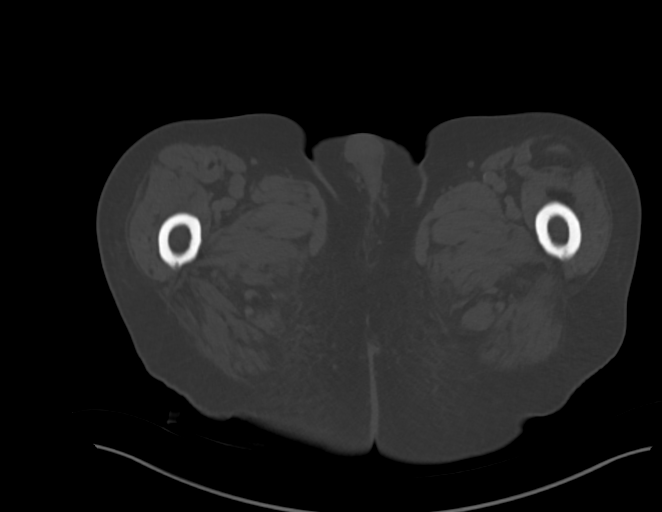
[im 20/111  soft-tissue]
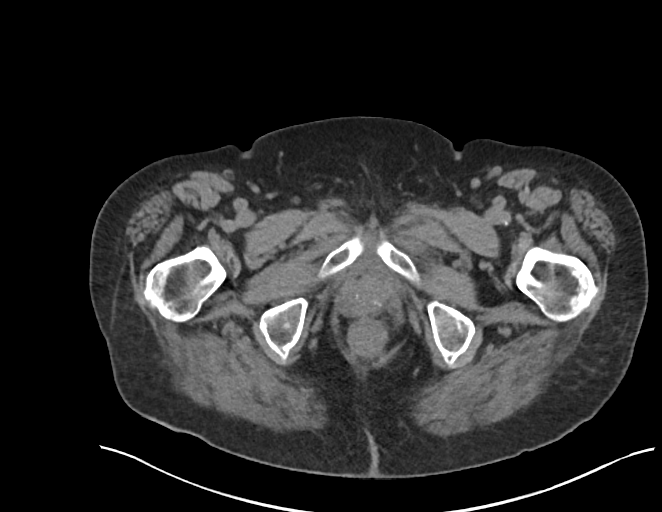
[im 34/111  soft-tissue]
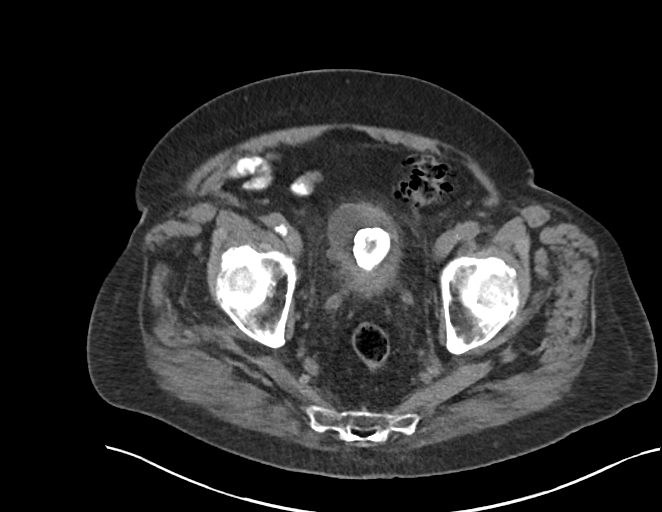
[im 44/111  soft-tissue]
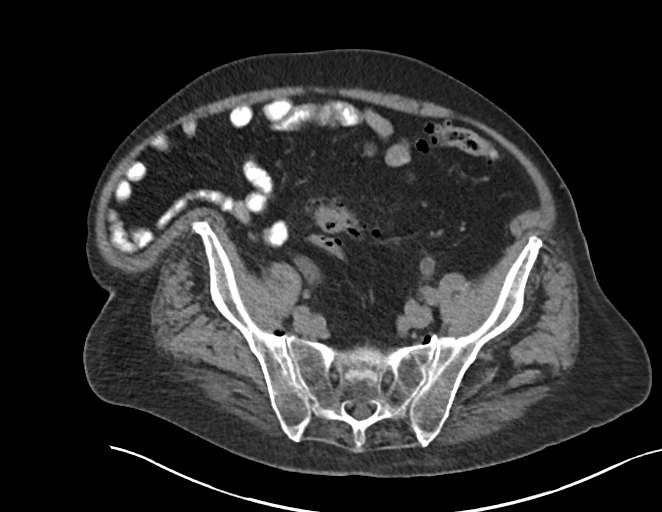
[im 58/111  soft-tissue]
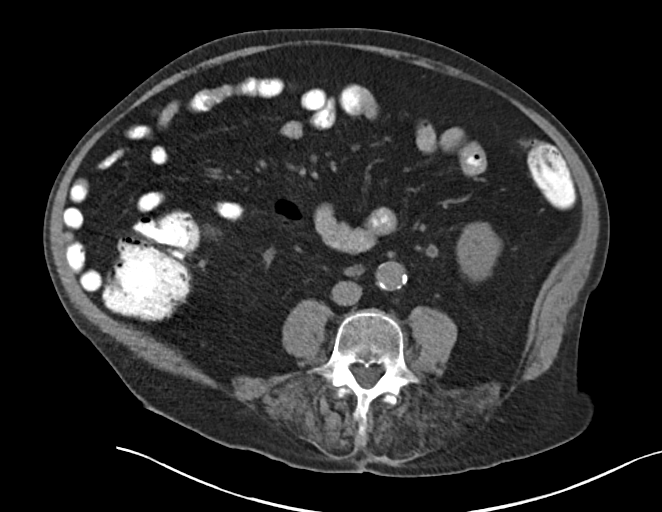
[im 67/111  soft-tissue]
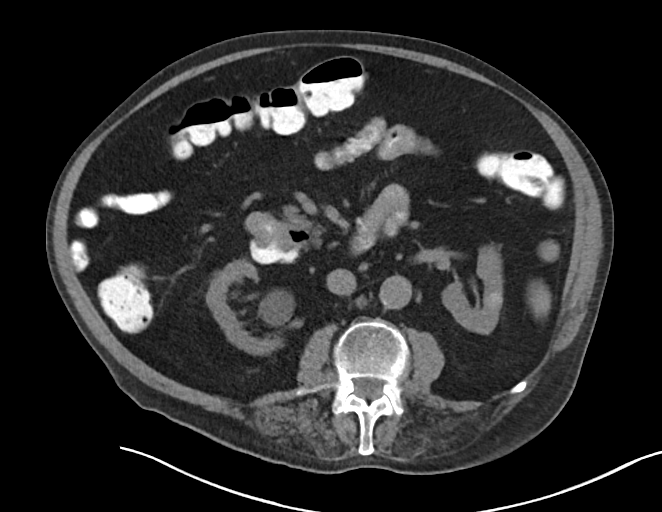
[im 77/111  soft-tissue]
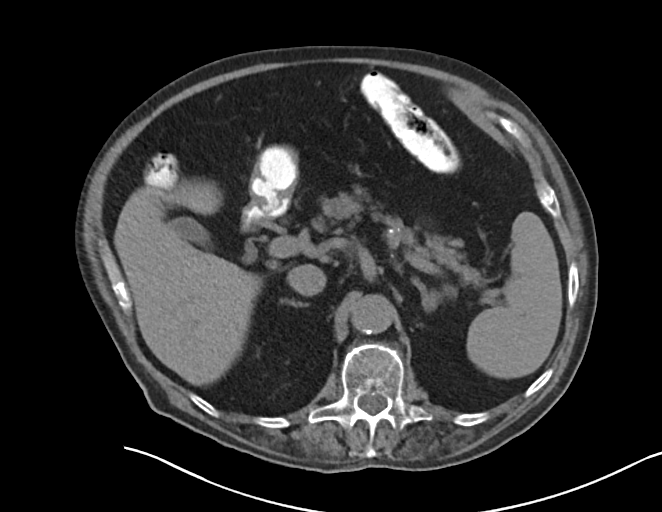
[im 91/111  soft-tissue]
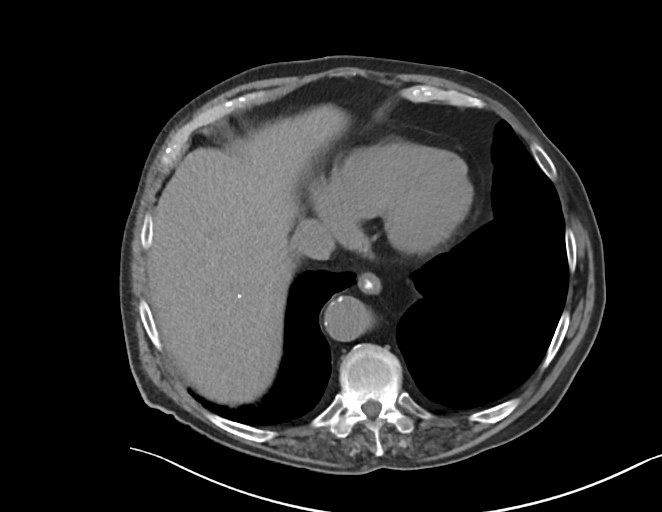
[im 101/111  soft-tissue]
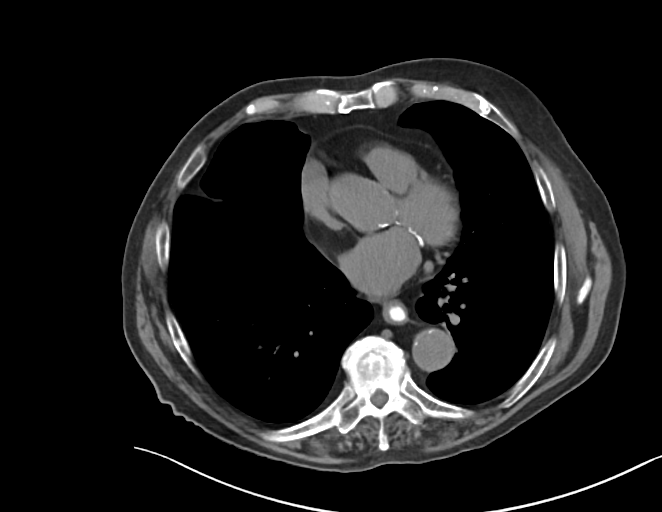
[im 101/111  bone]
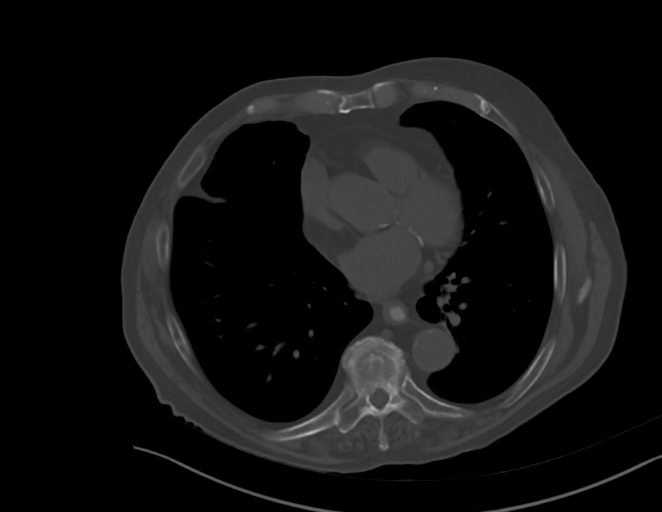

[Series 4: routine abdomen pelvis without 2.00 br40 s3 cor · coronal · non-contrast · 0.92mm/px · 3 of 182 slices shown]
[im 61/182  soft-tissue]
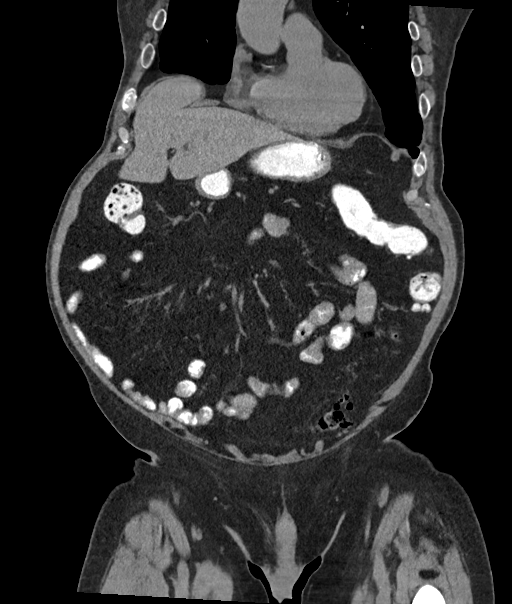
[im 81/182  soft-tissue]
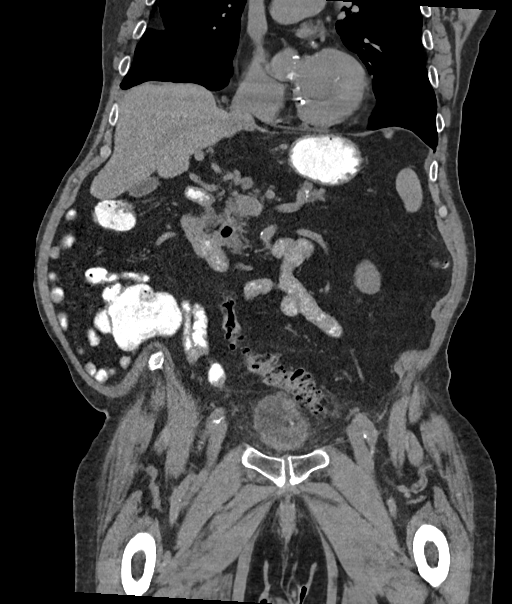
[im 101/182  soft-tissue]
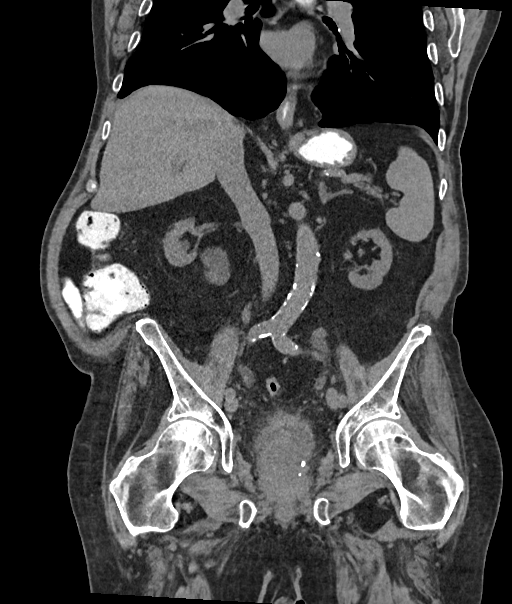

[12 of 46 positions shown; findings below may reference images not displayed]

FINDINGS: Lower chest: 3 mm subpleural nodule in the right lower lobe (series
9/image 8), unchanged, benign.

Hepatobiliary: Unenhanced liver is unremarkable.

Gallbladder is unremarkable. No intrahepatic or extrahepatic ductal
dilatation.

Pancreas: Within normal limits.

Spleen: Within normal limits.

Adrenals/Urinary Tract: Adrenal glands are within normal limits.

Left kidney is notable for a 16 mm benign hemorrhagic cysts in the
lateral left upper pole (series 2/image 43). Additional 13 mm simple
cyst in the posterior left lower kidney (series 2/image 47). 2 mm
nonobstructing left lower pole renal calculus (series 2/image 51).
No hydronephrosis.

Right kidney is notable for mild right hydronephrosis with prominent
extrarenal pelvis. No obstructing ureteral calculus is seen.

Bladder wall thickening with trabeculation and perivesical
stranding, suggesting cystitis, although an underlying bladder
neoplasm along the right posterolateral bladder is not excluded
(series 2/image 80). Additional irregular bladder wall thickening
along the left superior bladder dome (series 2/image 36). Associated
3.5 cm bladder calculus.

Stomach/Bowel: Contrast within the mid/distal esophagus, nonspecific
but possibly reflecting gastroesophageal reflux.

Stomach is within normal limits.

No evidence of bowel obstruction. Small to moderate duodenal
diverticulum (series 2/image 43).

Appendix is not discretely visualized.

Left colonic diverticulosis. Adjacent pericolonic stranding along
the sigmoid colon is favored to be related to the bladder
inflammation rather than sigmoid diverticulitis.

Vascular/Lymphatic: No evidence of abdominal aortic aneurysm.

Atherosclerotic calcifications of the abdominal aorta and branch
vessels.

Small upper abdominal and retroperitoneal lymph nodes, including a
dominant 9 mm short axis inferior aortocaval node (series 2/image
55). These findings are similar to 9147 and therefore likely
reactive.

Reproductive: Prostatomegaly in this patient with known prostate
cancer. Enlarged central gland indents the base of the bladder
posteriorly.

Other: No abdominopelvic ascites.

Musculoskeletal: Mild to moderate superior endplate compression
fracture deformity at L2, age indeterminate, although new from 6318
myelogram. No retropulsion.

Degenerative changes of the visualized thoracolumbar spine. No focal
osseous lesions.
IMPRESSION: Suspected cystitis.  Indwelling 3.5 cm bladder calculus.

Associated irregular bladder wall thickening is poorly evaluated on
CT. However, given additional findings, cystoscopy is suggested to
exclude primary bladder neoplasm.

Mild right hydroureteronephrosis. No associated ureteral calculus on
CT.

Prostatomegaly in this patient with known prostate cancer. Small
upper abdominal/retroperitoneal lymph nodes are similar to 9147,
likely reactive. Correlate with PSA.

Otherwise, no findings suspicious for metastatic disease. No focal
osseous lesions.

These results will be called to the ordering clinician or
representative by the Radiologist Assistant, and communication
documented in the PACS or zVision Dashboard.
